# Patient Record
Sex: Female | Born: 1940
Health system: Southern US, Community
[De-identification: ages and names within clinical notes are randomized; demographics above are authoritative.]

## PROBLEM LIST (undated history)

## (undated) DIAGNOSIS — F419 Anxiety disorder, unspecified: Secondary | ICD-10-CM

## (undated) DIAGNOSIS — E119 Type 2 diabetes mellitus without complications: Secondary | ICD-10-CM

## (undated) DIAGNOSIS — M5136 Other intervertebral disc degeneration, lumbar region: Secondary | ICD-10-CM

## (undated) DIAGNOSIS — K219 Gastro-esophageal reflux disease without esophagitis: Secondary | ICD-10-CM

## (undated) DIAGNOSIS — G4733 Obstructive sleep apnea (adult) (pediatric): Secondary | ICD-10-CM

## (undated) DIAGNOSIS — G720 Drug-induced myopathy: Secondary | ICD-10-CM

## (undated) DIAGNOSIS — I2510 Atherosclerotic heart disease of native coronary artery without angina pectoris: Secondary | ICD-10-CM

## (undated) DIAGNOSIS — M51369 Other intervertebral disc degeneration, lumbar region without mention of lumbar back pain or lower extremity pain: Secondary | ICD-10-CM

## (undated) DIAGNOSIS — K760 Fatty (change of) liver, not elsewhere classified: Secondary | ICD-10-CM

## (undated) DIAGNOSIS — R030 Elevated blood-pressure reading, without diagnosis of hypertension: Secondary | ICD-10-CM

## (undated) DIAGNOSIS — R109 Unspecified abdominal pain: Secondary | ICD-10-CM

## (undated) DIAGNOSIS — R011 Cardiac murmur, unspecified: Secondary | ICD-10-CM

## (undated) DIAGNOSIS — E785 Hyperlipidemia, unspecified: Secondary | ICD-10-CM

## (undated) DIAGNOSIS — Z780 Asymptomatic menopausal state: Secondary | ICD-10-CM

## (undated) DIAGNOSIS — C4491 Basal cell carcinoma of skin, unspecified: Secondary | ICD-10-CM

## (undated) HISTORY — PX: BREAST EXCISIONAL BIOPSY: SUR124

## (undated) HISTORY — DX: Basal cell carcinoma of skin, unspecified: C44.91

## (undated) HISTORY — DX: Elevated blood-pressure reading, without diagnosis of hypertension: R03.0

## (undated) HISTORY — PX: FOOT SURGERY: SHX648

## (undated) HISTORY — PX: SHOULDER SURGERY: SHX246

## (undated) HISTORY — PX: CARPAL TUNNEL RELEASE: SHX101

## (undated) HISTORY — DX: Drug-induced myopathy: G72.0

## (undated) HISTORY — DX: Fatty (change of) liver, not elsewhere classified: K76.0

## (undated) HISTORY — DX: Anxiety disorder, unspecified: F41.9

## (undated) HISTORY — DX: Asymptomatic menopausal state: Z78.0

## (undated) HISTORY — PX: OTHER SURGICAL HISTORY: SHX169

## (undated) HISTORY — DX: Type 2 diabetes mellitus without complications: E11.9

## (undated) HISTORY — DX: Unspecified abdominal pain: R10.9

## (undated) HISTORY — DX: Hyperlipidemia, unspecified: E78.5

## (undated) HISTORY — PX: TUBAL LIGATION: SHX77

## (undated) HISTORY — PX: TOTAL HIP ARTHROPLASTY: SHX124

## (undated) HISTORY — DX: Gastro-esophageal reflux disease without esophagitis: K21.9

## (undated) HISTORY — DX: Obstructive sleep apnea (adult) (pediatric): G47.33

## (undated) HISTORY — DX: Other intervertebral disc degeneration, lumbar region: M51.36

## (undated) HISTORY — PX: NASAL SINUS SURGERY: SHX719

## (undated) HISTORY — DX: Other intervertebral disc degeneration, lumbar region without mention of lumbar back pain or lower extremity pain: M51.369

---

## 1998-08-23 ENCOUNTER — Other Ambulatory Visit: Admission: RE | Admit: 1998-08-23 | Discharge: 1998-08-23 | Payer: Self-pay | Admitting: Obstetrics and Gynecology

## 1999-09-11 ENCOUNTER — Other Ambulatory Visit: Admission: RE | Admit: 1999-09-11 | Discharge: 1999-09-11 | Payer: Self-pay | Admitting: Obstetrics and Gynecology

## 1999-10-02 ENCOUNTER — Ambulatory Visit (HOSPITAL_BASED_OUTPATIENT_CLINIC_OR_DEPARTMENT_OTHER): Admission: RE | Admit: 1999-10-02 | Discharge: 1999-10-02 | Payer: Self-pay | Admitting: General Surgery

## 2000-01-02 ENCOUNTER — Ambulatory Visit (HOSPITAL_COMMUNITY): Admission: RE | Admit: 2000-01-02 | Discharge: 2000-01-02 | Payer: Self-pay | Admitting: *Deleted

## 2000-01-02 ENCOUNTER — Encounter (INDEPENDENT_AMBULATORY_CARE_PROVIDER_SITE_OTHER): Payer: Self-pay | Admitting: Specialist

## 2000-01-03 ENCOUNTER — Encounter: Payer: Self-pay | Admitting: Obstetrics and Gynecology

## 2000-01-03 ENCOUNTER — Encounter: Admission: RE | Admit: 2000-01-03 | Discharge: 2000-01-03 | Payer: Self-pay | Admitting: Obstetrics and Gynecology

## 2000-05-08 ENCOUNTER — Encounter: Payer: Self-pay | Admitting: Endocrinology

## 2000-05-08 ENCOUNTER — Encounter: Admission: RE | Admit: 2000-05-08 | Discharge: 2000-05-08 | Payer: Self-pay | Admitting: Endocrinology

## 2000-06-24 ENCOUNTER — Encounter: Admission: RE | Admit: 2000-06-24 | Discharge: 2000-07-23 | Payer: Self-pay | Admitting: Neurological Surgery

## 2000-10-23 ENCOUNTER — Other Ambulatory Visit: Admission: RE | Admit: 2000-10-23 | Discharge: 2000-10-23 | Payer: Self-pay | Admitting: Obstetrics and Gynecology

## 2000-11-03 ENCOUNTER — Encounter: Payer: Self-pay | Admitting: Obstetrics and Gynecology

## 2000-11-03 ENCOUNTER — Encounter: Admission: RE | Admit: 2000-11-03 | Discharge: 2000-11-03 | Payer: Self-pay | Admitting: Obstetrics and Gynecology

## 2000-11-05 ENCOUNTER — Encounter: Admission: RE | Admit: 2000-11-05 | Discharge: 2000-11-05 | Payer: Self-pay | Admitting: Obstetrics and Gynecology

## 2000-11-05 ENCOUNTER — Encounter: Payer: Self-pay | Admitting: Obstetrics and Gynecology

## 2000-12-05 ENCOUNTER — Encounter (INDEPENDENT_AMBULATORY_CARE_PROVIDER_SITE_OTHER): Payer: Self-pay

## 2000-12-05 ENCOUNTER — Other Ambulatory Visit: Admission: RE | Admit: 2000-12-05 | Discharge: 2000-12-05 | Payer: Self-pay | Admitting: Obstetrics and Gynecology

## 2001-10-29 ENCOUNTER — Other Ambulatory Visit: Admission: RE | Admit: 2001-10-29 | Discharge: 2001-10-29 | Payer: Self-pay | Admitting: Obstetrics and Gynecology

## 2001-11-18 ENCOUNTER — Encounter: Payer: Self-pay | Admitting: Obstetrics and Gynecology

## 2001-11-18 ENCOUNTER — Encounter: Admission: RE | Admit: 2001-11-18 | Discharge: 2001-11-18 | Payer: Self-pay | Admitting: Obstetrics and Gynecology

## 2002-11-16 ENCOUNTER — Other Ambulatory Visit: Admission: RE | Admit: 2002-11-16 | Discharge: 2002-11-16 | Payer: Self-pay | Admitting: Obstetrics and Gynecology

## 2002-11-22 ENCOUNTER — Encounter: Admission: RE | Admit: 2002-11-22 | Discharge: 2002-11-22 | Payer: Self-pay | Admitting: Obstetrics and Gynecology

## 2002-11-22 ENCOUNTER — Encounter: Payer: Self-pay | Admitting: Obstetrics and Gynecology

## 2003-12-01 ENCOUNTER — Other Ambulatory Visit: Admission: RE | Admit: 2003-12-01 | Discharge: 2003-12-01 | Payer: Self-pay | Admitting: Obstetrics and Gynecology

## 2003-12-13 ENCOUNTER — Encounter: Admission: RE | Admit: 2003-12-13 | Discharge: 2003-12-13 | Payer: Self-pay | Admitting: Obstetrics and Gynecology

## 2004-03-21 ENCOUNTER — Encounter: Admission: RE | Admit: 2004-03-21 | Discharge: 2004-03-21 | Payer: Self-pay | Admitting: Otolaryngology

## 2004-03-28 ENCOUNTER — Ambulatory Visit (HOSPITAL_COMMUNITY): Admission: RE | Admit: 2004-03-28 | Discharge: 2004-03-28 | Payer: Self-pay | Admitting: Otolaryngology

## 2004-03-28 ENCOUNTER — Encounter (INDEPENDENT_AMBULATORY_CARE_PROVIDER_SITE_OTHER): Payer: Self-pay | Admitting: Specialist

## 2004-07-12 ENCOUNTER — Encounter (INDEPENDENT_AMBULATORY_CARE_PROVIDER_SITE_OTHER): Payer: Self-pay | Admitting: *Deleted

## 2004-07-12 ENCOUNTER — Inpatient Hospital Stay (HOSPITAL_COMMUNITY): Admission: RE | Admit: 2004-07-12 | Discharge: 2004-07-18 | Payer: Self-pay | Admitting: Neurological Surgery

## 2004-12-04 ENCOUNTER — Other Ambulatory Visit: Admission: RE | Admit: 2004-12-04 | Discharge: 2004-12-04 | Payer: Self-pay | Admitting: Obstetrics and Gynecology

## 2005-01-03 ENCOUNTER — Encounter: Admission: RE | Admit: 2005-01-03 | Discharge: 2005-01-03 | Payer: Self-pay | Admitting: Obstetrics and Gynecology

## 2006-01-02 ENCOUNTER — Other Ambulatory Visit: Admission: RE | Admit: 2006-01-02 | Discharge: 2006-01-02 | Payer: Self-pay | Admitting: Obstetrics and Gynecology

## 2006-01-14 ENCOUNTER — Encounter: Admission: RE | Admit: 2006-01-14 | Discharge: 2006-01-14 | Payer: Self-pay | Admitting: Obstetrics and Gynecology

## 2006-04-09 ENCOUNTER — Ambulatory Visit (HOSPITAL_COMMUNITY): Admission: RE | Admit: 2006-04-09 | Discharge: 2006-04-09 | Payer: Self-pay | Admitting: *Deleted

## 2006-04-09 ENCOUNTER — Encounter (INDEPENDENT_AMBULATORY_CARE_PROVIDER_SITE_OTHER): Payer: Self-pay | Admitting: *Deleted

## 2007-01-26 ENCOUNTER — Encounter: Admission: RE | Admit: 2007-01-26 | Discharge: 2007-01-26 | Payer: Self-pay | Admitting: Obstetrics and Gynecology

## 2007-02-10 ENCOUNTER — Other Ambulatory Visit: Admission: RE | Admit: 2007-02-10 | Discharge: 2007-02-10 | Payer: Self-pay | Admitting: Obstetrics and Gynecology

## 2008-01-28 ENCOUNTER — Encounter: Admission: RE | Admit: 2008-01-28 | Discharge: 2008-01-28 | Payer: Self-pay | Admitting: Obstetrics and Gynecology

## 2008-04-28 ENCOUNTER — Ambulatory Visit (HOSPITAL_COMMUNITY): Admission: RE | Admit: 2008-04-28 | Discharge: 2008-04-28 | Payer: Self-pay | Admitting: *Deleted

## 2008-05-05 ENCOUNTER — Encounter: Admission: RE | Admit: 2008-05-05 | Discharge: 2008-05-05 | Payer: Self-pay | Admitting: Orthopedic Surgery

## 2008-05-23 ENCOUNTER — Encounter: Admission: RE | Admit: 2008-05-23 | Discharge: 2008-05-23 | Payer: Self-pay | Admitting: Neurological Surgery

## 2009-02-02 ENCOUNTER — Encounter: Admission: RE | Admit: 2009-02-02 | Discharge: 2009-02-02 | Payer: Self-pay | Admitting: Obstetrics and Gynecology

## 2009-03-20 ENCOUNTER — Other Ambulatory Visit: Admission: RE | Admit: 2009-03-20 | Discharge: 2009-03-20 | Payer: Self-pay | Admitting: Obstetrics and Gynecology

## 2009-09-11 ENCOUNTER — Inpatient Hospital Stay (HOSPITAL_COMMUNITY): Admission: RE | Admit: 2009-09-11 | Discharge: 2009-09-14 | Payer: Self-pay | Admitting: Orthopedic Surgery

## 2010-02-09 ENCOUNTER — Encounter: Admission: RE | Admit: 2010-02-09 | Discharge: 2010-02-09 | Payer: Self-pay | Admitting: Endocrinology

## 2010-05-16 ENCOUNTER — Encounter: Admission: RE | Admit: 2010-05-16 | Discharge: 2010-05-16 | Payer: Self-pay | Admitting: Neurological Surgery

## 2010-09-30 LAB — BASIC METABOLIC PANEL
BUN: 7 mg/dL (ref 6–23)
BUN: 9 mg/dL (ref 6–23)
CO2: 28 mEq/L (ref 19–32)
Calcium: 8 mg/dL — ABNORMAL LOW (ref 8.4–10.5)
Calcium: 8.1 mg/dL — ABNORMAL LOW (ref 8.4–10.5)
Chloride: 102 mEq/L (ref 96–112)
Chloride: 104 mEq/L (ref 96–112)
Creatinine, Ser: 0.77 mg/dL (ref 0.4–1.2)
Creatinine, Ser: 0.88 mg/dL (ref 0.4–1.2)
GFR calc Af Amer: >60 mL/min (ref >60)
GFR calc Af Amer: >60 mL/min (ref >60)
GFR calc non Af Amer: >60 mL/min (ref >60)
GFR calc non Af Amer: >60 mL/min (ref >60)
GFR calc non Af Amer: >60 mL/min (ref >60)
Potassium: 4 mEq/L (ref 3.5–5.1)
Potassium: 4.1 mEq/L (ref 3.5–5.1)
Sodium: 136 mEq/L (ref 135–145)
Sodium: 139 mEq/L (ref 135–145)

## 2010-09-30 LAB — CROSSMATCH: Antibody Screen: NEGATIVE

## 2010-09-30 LAB — CBC
HCT: 24.5 % — ABNORMAL LOW (ref 36.0–46.0)
HCT: 31.2 % — ABNORMAL LOW (ref 36.0–46.0)
HCT: 39 % (ref 36.0–46.0)
Hemoglobin: 13.6 g/dL (ref 12.0–15.0)
MCV: 98.1 fL (ref 78.0–100.0)
Platelets: 159 10*3/uL (ref 150–400)
Platelets: 161 10*3/uL (ref 150–400)
Platelets: 189 10*3/uL (ref 150–400)
RBC: 2.89 MIL/uL — ABNORMAL LOW (ref 3.87–5.11)
RBC: 4.03 MIL/uL (ref 3.87–5.11)
RDW: 12.6 % (ref 11.5–15.5)
WBC: 7.5 10*3/uL (ref 4.0–10.5)
WBC: 7.6 10*3/uL (ref 4.0–10.5)
WBC: 7.7 10*3/uL (ref 4.0–10.5)

## 2010-09-30 LAB — DIFFERENTIAL
Basophils Absolute: 0 10*3/uL (ref 0.0–0.1)
Basophils Relative: 1 % (ref 0–1)
Eosinophils Absolute: 0.1 10*3/uL (ref 0.0–0.7)
Monocytes Relative: 8 % (ref 3–12)
Neutro Abs: 4.9 10*3/uL (ref 1.7–7.7)
Neutrophils Relative %: 65 % (ref 43–77)

## 2010-09-30 LAB — COMPREHENSIVE METABOLIC PANEL
ALT: 25 U/L (ref 0–35)
Alkaline Phosphatase: 67 U/L (ref 39–117)
BUN: 11 mg/dL (ref 6–23)
CO2: 26 mEq/L (ref 19–32)
Chloride: 101 mEq/L (ref 96–112)
GFR calc non Af Amer: >60 mL/min (ref >60)
Glucose, Bld: 108 mg/dL — ABNORMAL HIGH (ref 70–99)
Potassium: 4.9 mEq/L (ref 3.5–5.1)
Sodium: 136 mEq/L (ref 135–145)
Total Bilirubin: 0.5 mg/dL (ref 0.3–1.2)
Total Protein: 6.9 g/dL (ref 6.0–8.3)

## 2010-09-30 LAB — URINALYSIS, ROUTINE W REFLEX MICROSCOPIC
Bilirubin Urine: NEGATIVE
Hgb urine dipstick: NEGATIVE
Ketones, ur: NEGATIVE mg/dL
Specific Gravity, Urine: 1.006 (ref 1.005–1.030)
Urobilinogen, UA: 0.2 mg/dL (ref 0.0–1.0)

## 2010-09-30 LAB — URINE MICROSCOPIC-ADD ON

## 2010-09-30 LAB — URINE CULTURE
Colony Count: NO GROWTH
Culture: NO GROWTH

## 2010-09-30 LAB — APTT: aPTT: 32 seconds (ref 24–37)

## 2010-09-30 LAB — PROTIME-INR: INR: 0.99 (ref 0.00–1.49)

## 2010-09-30 LAB — ABO/RH: ABO/RH(D): A POS

## 2010-11-20 NOTE — Op Note (Signed)
Teresa Marquez, DELCONTE          ACCOUNT NO.:  [id number]   MEDICAL RECORD NO.:  [id number]          PATIENT TYPE:  AMB   LOCATION:  ENDO                         FACILITY:  [facility]   PHYSICIAN:  Georgiana Spinner, M.D.    DATE OF BIRTH:  Nov 24, 1940   DATE OF PROCEDURE:  04/28/2008  DATE OF DISCHARGE:                               OPERATIVE REPORT   PROCEDURE:  Colonoscopy.   INDICATIONS:  Colon polyp follow-up.   ANESTHESIA:  Fentanyl 75 mcg, Versed 7.5 mg.   PROCEDURE:  With the patient mildly sedated in the left lateral  decubitus position the Pentax videoscopic colonoscope was inserted in  the rectum and passed under direct vision to the cecum identified by  ileocecal valve and appendiceal orifice both of which were photographed.  From this point the colonoscope was slowly withdrawn taking  circumferential views of colonic mucosa stopping only in the rectum  which appeared normal on direct and showed hemorrhoids on retroflexed  view. The endoscope was straightened and withdrawn.  The patient's vital  signs, pulse oximeter remained stable.  The patient tolerated procedure  well without apparent complications.   FINDINGS:  Internal hemorrhoids, otherwise negative colonoscopic  examination to the cecum.   PLAN:  Repeat examination in 5 years.           ______________________________  Georgiana Spinner, M.D.     GMO/MEDQ  D:  04/28/2008  T:  04/28/2008  Job:  [id number]

## 2010-11-23 NOTE — H&P (Signed)
Teresa Marquez, Teresa Marquez          ACCOUNT NO.:  [id number]   MEDICAL RECORD NO.:  [id number]          PATIENT TYPE:  INP   LOCATION:  2899                         FACILITY:  [facility]   PHYSICIAN:  Stefani Dama, M.D.  DATE OF BIRTH:  1940/09/26   DATE OF ADMISSION:  07/12/2004  DATE OF DISCHARGE:                                HISTORY & PHYSICAL   ADMISSION DIAGNOSIS:  Pituitary adenoma.   HISTORY OF PRESENT ILLNESS:  Teresa Marquez is a 70 year old right-  handed individual who has been seen in the past for back problems.  She has  had difficulty with severe snoring for a long period of time, and she was  recently seen by Dr. Osborn Coho for a workup, including a CT scan that  demonstrated a mass in the region of the sphenoid sinus.  An MRI  subsequently demonstrated evidence of intracranial extension.  Except for  the fact that the lesion was largely growing laterally to encase the carotid  arteries and expanded the sphenoid sinus substantially, there was not a  significant extension that would be compressing the optic apparatus.  A  biopsy of this lesion was performed, and it was felt to be consistent with a  pituitary adenoma by Dr. Amparo Bristol at [facility].  The  patient is now being admitted to the hospital to undergo transsphenoidal  resection of the bulk of this lesion.  She notes that she has a consistent  feeling of headache, fullness in the nose, and breathing has been quite  difficulty, particularly at night with loud, sonorous snoring.  Because of  this mass causing the somatic symptoms she is experiencing, she has been  advised regarding surgical decompression.  A preoperative endocrinologic  workup shows that her endocrine function is within the limits of normal and  no visual interference is noted on the basis of this tumor.   PAST MEDICAL HISTORY:  Her general health has been fair.  She does have some  mild hypertension.  She has a  significant history of reflux, managed by  Protonix.   She notes an allergy to PENICILLIN.   The morbid history of significant sleep apnea has been noted.   SOCIAL HISTORY:  She is married.  She does not smoke.  She does not drink  alcohol.  Her height and weight have been reasonably stable.   PHYSICAL EXAMINATION:  GENERAL:  Her blood pressure is 150/84, heart rate is  92 and regular, respirations are 16 and unlabored.  NEUROLOGIC:  Pupils are 4 mm and briskly reactive to light and  accommodation.  Extraocular movements are full, and the face is symmetric to  grimace.  Tongue and uvula are in the midline.  Funduscopic examination  reveals that discs are flat.  There is some pallor in the optic disc on the  right side.  Her visual field examination to coarse confrontation is normal.  Motor strength appears symmetric and without any evidence of a drift.  Station and gait are normal.  Deep tendon reflexes are 2+ in the biceps and  triceps, 2+ in the brachioradialis, 1+ in the patellae,  and the Babinski  reflexes are downgoing.  CHEST:  General physical exam reveals the lungs are clear to auscultation.  CARDIAC:  The heart has a regular rate and rhythm, no murmurs is heard.  ABDOMEN:  Soft, protuberant, bowel sounds are positive, no masses are  palpable.  EXTREMITIES:  No cyanosis, clubbing, or edema.   IMPRESSION:  The patient has evidence of significant pituitary adenoma.  She  has been advised regarding the risks and benefits of a surgical resection of  this lesion.  She does feel that there has been increasing pain related to  this lesion, plus that it seems to be obstructing her ability to breathe,  particularly at night, and she is now being admitted to undergo elective  resection of this mass.      Henr   HJE/MEDQ  D:  07/12/2004  T:  07/12/2004  Job:  [id number]

## 2010-11-23 NOTE — Procedures (Signed)
[facility]  Patient:    Teresa Marquez, Teresa Marquez                 MRN: [id number] Adm. Date:  [id number] Attending:  Sabino Gasser                           Procedure Report  PROCEDURE:  Colonoscopy.  INDICATIONS:  Hemoccult positivity, family history of colon cancer.  ANESTHESIA:  Demerol 20 mg, Versed 2 mg, was given intravenously in divided dose.  DESCRIPTION OF PROCEDURE:  With the patient mildly sedated in the left lateral decubitus position, the rectal exam was performed, which showed hemorrhoids. Subsequently, the Olympus videoscopic colonoscope was inserted in the rectum and passed under direct vision to the cecum.  Cecum identified by ileocecal valve and appendiceal orifice, both of which were photographed.  From this point, the colonoscope was slowly withdrawn, taking circumferential views of the entire colonic mucosa, stopping only in the rectum, which appeared normal on direct view and showed mild internal hemorrhoids on retroflexed view.  The endoscope was straightened and withdrawn.  The patients vital signs and pulse oximetry remained stable.  The patient tolerated the procedure well without apparent complications.  FINDINGS:  Hemorrhoids, otherwise unremarkable examination.  PLAN:  Repeat exam in five years.DD:  01/02/00 TD:  01/03/00 Job: [id number]

## 2010-11-23 NOTE — Op Note (Signed)
NAMEKYMBERLI, WIEGAND          ACCOUNT NO.:  [id number]   MEDICAL RECORD NO.:  [id number]          PATIENT TYPE:  INP   LOCATION:  2899                         FACILITY:  [facility]   PHYSICIAN:  Stefani Dama, M.D.  DATE OF BIRTH:  [birth year]   DATE OF PROCEDURE:  07/12/2004  DATE OF DISCHARGE:                                 OPERATIVE REPORT   PREOPERATIVE DIAGNOSIS:  Pituitary adenoma.   POSTOPERATIVE DIAGNOSIS:  Pituitary adenoma.   PROCEDURE:  Insertion of lumbar spinal fluid drainage catheter.   SURGEON:  Stefani Dama, M.D.   ANESTHESIA:  General endotracheal.   INDICATIONS:  Ms. Teresa Marquez is a 70 year old individual who is  undergoing transsphenoidal resection of a pituitary adenoma.  Prior to the  start of surgery, it was advised that she have a lumbar drainage catheter  placed for CSF diversion for management of potential CSF leaks during the  postoperative phase; this is now being performed.   PROCEDURE:  The patient was placed under general endotracheal anesthesia and  after central venous monitoring catheters and a Foley catheter was placed,  she was turned into the left lateral decubitus position and the back was  prepped with alcohol, then Betadine solution.  Once dried, a 17-gauge Tuohy  needle was inserted into the interlaminar space believed to be at L3-L4 by  palpation; spinal fluid was obtained.  A lumbar drainage catheter was then  inserted into the Tuohy needle and treated approximately 15 cm into the  epidural space.  Carefully then, the needle was withdrawn; catheter removed  in place.  The system was then connected to a drainage system and placed in  the off position.  A dry sterile dressing was applied over the insertion  site and the catheter itself was secured to the right lateral aspect of the  patient's abdomen.  She was then turned back into the supine position and  the procedure was the commenced.      Henr   HJE/MEDQ  D:   07/12/2004  T:  07/12/2004  Job:  [id number]

## 2010-11-23 NOTE — Op Note (Signed)
NAMEANNIKAH, LOVINS          ACCOUNT NO.:  [id number]   MEDICAL RECORD NO.:  [id number]          PATIENT TYPE:  INP   LOCATION:  2899                         FACILITY:  [facility]   PHYSICIAN:  Kinnie Scales. Shoemaker, M.D.DATE OF BIRTH:  [birth year]   DATE OF PROCEDURE:  07/12/2004  DATE OF DISCHARGE:                                 OPERATIVE REPORT   PREOPERATIVE DIAGNOSIS:  Massive pituitary tumor with extension into the  sphenoid sinus and nasal cavity.   POSTOPERATIVE DIAGNOSIS:  Massive pituitary tumor with extension into the  sphenoid sinus and nasal cavity.   PROCEDURE:  Transseptal transsphenoidal resection of pituitary tumor.  This  is ENT dictation for approach and closure for pituitary hypophysectomy.   ANESTHESIA:  General endotracheal.   SURGEON:  Neurosurgeon:  Stefani Dama, M.D.  ENT surgeon:  Kinnie Scales. Annalee Genta, M.D.   ESTIMATED BLOOD LOSS:  Approximately 100 mL.   COMPLICATIONS:  None.  The patient was transferred from the operating room  to the recovery room in stable condition.   INDICATIONS FOR PROCEDURE:  The patient is a 70 year old white female who  was evaluated with a history of nasal airway obstruction and snoring without  significant obstructive sleep apnea.  In evaluating the patient she was  found to have a soft tissue mass filling the posterior aspect of the nasal  cavity contributing to nasal airway congestion and obstruction.  Evaluation  of this mass including CT scan and MRI showed a very large soft tissue mass  consistent with a possible pituitary adenoma extending from the middle  cranial fossa into the sphenoid sinus and extending into the posterior nasal  cavity.  Biopsy was undertaken for tissue diagnosis and this was performed  under general anesthesia on March 28, 2004.  Pathology from the  endoscopic biopsy was consistent with benign pituitary adenoma and despite  the extensive nature of the patient's tumor, it was thought that  this  represented benign process.  Neurosurgical consultation with Dr. Danielle Dess was  then undertaken and given the extensive nature of the mass and lack of  physical findings, recommendations were discussed regarding various  treatment options.  The risks, benefits, and possible complications of  surgical intervention were discussed in detail with the patient and her  family and they opted for surgical excision of the mass under general  anesthesia.   DESCRIPTION OF PROCEDURE:  The patient was brought to the operating room on  July 12, 2004, and placed in the supine position on the operating table.  General endotracheal anesthesia was then established without difficulty.  A  lumbar drain was placed by Dr. Danielle Dess and this is dictated as a separate  operative procedure.  The patient's abdomen and nasal cavity were then  prepped and draped.  The patient's nose was injected with a total of 15 mL  of 1% lidocaine with 1:100,000 dilution of epinephrine which was injected in  a submucosal fashion along the nasal septum and nasal floor bilaterally.  The patient's nose was then packed with Afrin-soaked cottonoid pledgets  which were left in place for approximately 10 minutes to allow for  vasoconstriction  and hemostasis.  The patient had undergone prior nasal  septoplasty.  Given this fact, a transseptal approach was recommended.  A  right hemitransfixion incision was created in the anterior aspect of the  nasal cavity and this was carried across the floor of the right nasal  cavity.  A mucoperichondrial flap was elevated from anterior to posterior  along the patient's right-hand side, previous surgery had resulted in  resection of mid and posterior septal cartilage.  The mucosal flaps were  then elevated bilaterally and dissection was carried out from anterior to  posterior to the level of the anterior face of the sphenoid sinus.  At this  point, tumor was encountered within the sphenoid sinus  and a biopsy was  taken for frozen section to confirm diagnosis.  This came back as findings  consistent with benign pituitary adenoma.  An anterior transcolumellar  incision was then created along the anterior aspect of the external nasal  root using a #15 scalpel, the incision was created in the skin and  underlying subcutaneous tissue.  The anterior aspect of the nasal septal  cartilage and lower lateral cartilage were identified.  These were divided  at their attachments inferiorly and dissection was carried out along the  maxillary crest elevating the left septal mucosa laterally and creating  direct access for surgical intervention within the sphenoid and pituitary  regions.  A pituitary retractor was then inserted full length and C-arm  confirmation of anatomic location and surgical approach were confirmed.   Resection of the pituitary tumor was then undertaken by Dr. Danielle Dess and this  is dictated as a separate operative report.   Once the pituitary tumor had been adequately resected, the sphenoid cavity  was then packed with abdominal fat which was harvested through a  transabdominal incision.  The transseptal flaps were then reapproximated  with a 4-0 gut suture on a Keith needle in a horizontal mattress fashion  beginning posteriorly and extending anteriorly to reapproximate the entire  mucosal flaps.  Inspection of the nasal cavity was then undertaken and there  was no evidence of mucosal tear, bleeding, or CSF leak.  No evidence of fat  within the nasal cavity or at the level of the sphenoid ostia.  The anterior  columellar incision was closed in multiple layers with reapproximation of  the septal cartilage to the maxillary crest with a 4-0 Vicryl suture.  Deep  subcutaneous sutures were then used to close the incision and a final skin  closure was achieved with a 6-0 Ethilon suture.  Anterior hemitransfixion incision and mucosal flaps were again approximately with 4-0 gut  suture on a  Keith needle and this was done in a horizontal mattressing fashion.  Bilateral nasal septal splints were then placed after the application of  Bactroban ointment and these were sutured in position with a 3-0 Ethilon  suture.  The patient's nasal cavity was then packed bilaterally with an 8 cm  Mauriceau sponge in each nasal cavity.  This was hydrated with saline  solution.  The patient's oral cavity and oropharynx were then irrigated and  suctioned and oral gastric tube was passed and the stomach contents were  aspirated.  The patient was awakened from her anesthetic, she was extubated,  and was then transferred from the operating room to the recovery room in  stable condition.       DLS/MEDQ  D:  04/54/0981  T:  07/12/2004  Job:  [id number]

## 2010-11-23 NOTE — Discharge Summary (Signed)
[name], [name]          ACCOUNT NO.:  [id number]   MEDICAL RECORD NO.:  [id number]          PATIENT TYPE:  INP   LOCATION:  3102                         FACILITY:  [facility]   PHYSICIAN:  Stefani Dama, M.D.  DATE OF BIRTH:  08-06-1940   DATE OF ADMISSION:  07/12/2004  DATE OF DISCHARGE:  07/18/2004                                 DISCHARGE SUMMARY   ADMITTING DIAGNOSIS:  Pituitary macroadenoma.   MAJOR OPERATION:  Transsphenoidal resection of pituitary macroadenoma,  placement of lumbar drain.   CONDITION ON DISCHARGE:  Improving.   HOSPITAL COURSE:  Ms. Teresa Marquez is a 70 year old individual who was  noted to have difficulty with breathing and sonorous snoring.  She was  evaluated with a CT scan and an MRI that demonstrated the presence of a  large mass in the nasal sinus extending through the region of the sphenoid.  Biopsy of the nasal mass revealed this to be a pituitary adenoma.  After  preoperative evaluation, it was noted that she was endocrinologically intact  and the patient had normal ophthalmologic function.  She was advised  regarding surgical resection of this via a transsphenoidal route.  She was  taken to the operating room on July 12, 2004, where a combined procedure  for transsphenoidal resection of the tumor was performed with Dr. Onalee Hua L.  Shoemaker.  Postoperatively, the patient had a lumbar drain placed to  decrease intracranial pressure and drain spinal fluid so that no leakage  would ensue and this worked successfully.  Drain was removed on the 9th and  the patient had nasal packings also removed at that time.  She is breathing  through her nose and at the current time, she is not experiencing any  evidence of spinal fluid leakage.  She has been placed on some hormonal  replacement therapy in the form of hydrocortisone and will be sent home on a  decreasing Dosepak of Medrol.  She will be seen in about 2-3 weeks' time for  further  followup.  Pain has been minimal and well-controlled with mild  analgesics and she is given a prescription for Darvocet-N 100, #40.  Electrolytes and blood counts are within the limits of normal at the time of  discharge.   CONDITION ON DISCHARGE:  Improved.      Henr   HJE/MEDQ  D:  07/18/2004  T:  07/18/2004  Job:  [id number]

## 2010-11-23 NOTE — Procedures (Signed)
[facility]  Patient:    Teresa Marquez, Teresa Marquez                 MRN: [id number] Adm. Date:  [id number] Attending:  Sabino Gasser                           Procedure Report  PROCEDURE:  Upper endoscopy.  ENDOSCOPIST:  Sabino Gasser, M.D.  INDICATIONS:  Reflux esophagitis symptoms, fairly significant.  ANESTHESIA:  Demerol 60 mg and Versed 7 mg were given intravenously in divided dose.  DESCRIPTION OF PROCEDURE:  With patient mildly sedated in the left lateral decubitus position, the Olympus videoscopic endoscope was inserted in the mouth and passed under direct vision through the esophagus, which appeared normal except for the distal-most esophagus where there were changes of esophagitis which were photographed and biopsied.  We entered into the stomach.  The fundus, body and antrum were all well-visualized and appeared normal, as did duodenal bulb and second portion of duodenum.  Photographs were taken.  From this point, the scope was slowly withdrawn, taking circumferential views of the entire duodenal mucosa, until the endoscope had been pulled back into the stomach and placed in retroflexion to view the stomach from below and this too appeared normal and was photographed.  The endoscope was then straightened, pulled back from distal to proximal stomach, taking circumferential views of the entire gastric and subsequently esophageal mucosa which otherwise appeared normal.  Patients vital signs and pulse oximetry remained stable.  Patient tolerated the procedure well without apparent complications.  FINDINGS:  Changes of esophagitis, fairly mild actually, of the distal-most esophagus and proximal stomach area.  PLAN:  Await biopsy report.  Patient will call me for the results and follow up with me as an outpatient.  Proceed to colonoscopy. DD:  01/02/00 TD:  01/03/00 Job: [id number]

## 2010-11-23 NOTE — Op Note (Signed)
Teresa Marquez, Teresa Marquez          ACCOUNT NO.:  [id number]   MEDICAL RECORD NO.:  [id number]          PATIENT TYPE:  OIB   LOCATION:  NA                           FACILITY:  [facility]   PHYSICIAN:  Kinnie Scales. Annalee Genta, M.D.DATE OF BIRTH:  1940-07-21   DATE OF PROCEDURE:  03/28/2004  DATE OF DISCHARGE:                                 OPERATIVE REPORT   PREOPERATIVE DIAGNOSIS:  Expansile sphenoid mass.   POSTOPERATIVE DIAGNOSIS:  Expansile sphenoid mass.   OPERATION PERFORMED:  Endoscopic examination and biopsy of sphenoid mass  under general anesthesia.   SURGEON:  Kinnie Scales. Annalee Genta, M.D.   ANESTHESIA:  General.   COMPLICATIONS:  None.   ESTIMATED BLOOD LOSS:  Less than 50 mL.   The patient was transferred from the operating room to the recovery room in  stable condition.   INDICATIONS FOR PROCEDURE:  Teresa Marquez is a 70 year old white female who  has been followed with a history of nasal congestion, sinusitis and  obstructive sleep apnea.  The patient underwent nasal septoplasty and  anterior sinus surgery by Dr. Lazarus Salines in the early 1990s.  She had a  significant improvement in breathing and congestion.  She was seen in 1999  for evaluation of snoring and sleep apnea and underwent somnoplasty  procedure.  She returned several months ago with complaints of nasal  congestion and sinusitis.  The patient was evaluated in the office and  endoscopic examination revealed a polypoid mass in the right posterior nasal  cavity.  CT scanning was performed in the office which showed a large mass  filling the sphenoid sinus with bony erosion.  Work-up including CT and MRI  was performed.  This showed a large expansile mass involving the sphenoid  sinus and extending intracranially involving bilateral carotid arteries and  bilateral optic nerves.  Given the patient's history and examination, I  recommended that we undertake endoscopic biopsy in the operating room under  general anesthesia  for pathologic tissue diagnosis.  The risks, benefits and  possible complications were discussed.  The patient and her husband  understood and concurred with our plan for surgery which was scheduled as  above.   DESCRIPTION OF PROCEDURE:  The patient was brought to the operating room on  March 28, 2004 and placed in supine position on the operating room.  General endotracheal was established without difficulty.  When the patient  was adequately anesthetized, her nose was injected with 3 mL of 1% lidocaine  1:100,000 dilution epinephrine injected under direct visualization with a 0  degree endoscope into the anterior aspect of the sphenoid mass.  Cotton  pledgets were then placed for vasoconstriction.  After allowing adequate  time for vasoconstriction and hemostasis, the surgical procedure was begun  with bilateral examination of the nasal cavity.  The patient had a large  vascular appearing polypoid mass emanating from the anterior aspect of the  sphenoid sinus and filling the posterior superior aspect of the nasal  cavity.  No other intranasal abnormalities were noted.  There was no  evidence of purulent discharge, mass, polyp or other abnormality.  With the  mass identified,  biopsies were taken with through cutting forceps and sent  to pathology for frozen section analysis.  Initial diagnosis showed invasive  malignancy, specific tissue type unclear.  Additional biopsies were then  taken to allow adequate tissue for pathologic diagnosis.  The patient's  biopsy site was then cauterized with suction cautery and Surgicel was  layered over the area of biopsy for hemostasis.  The patient's nasal cavity  and nasopharynx were then irrigated and suctioned.  The oral cavity and  oropharynx suctioned, orogastric tube was passed.  the patient was awakened  from her anesthetic, extubated and transferred from the operating room to  the recovery room in stable condition.  There were no  complications.  Blood  loss was less than 50 mL.       DLS/MEDQ  D:  56/21/3086  T:  03/29/2004  Job:  [id number]   cc:   Brooke Bonito, M.D.  [address]  [place]  [place] [address]  Fax: [phone]

## 2010-11-23 NOTE — Op Note (Signed)
Teresa Marquez, ACHORN          ACCOUNT NO.:  [id number]   MEDICAL RECORD NO.:  [id number]          PATIENT TYPE:  INP   LOCATION:  2899                         FACILITY:  [facility]   PHYSICIAN:  Stefani Dama, M.D.  DATE OF BIRTH:  03-14-1941   DATE OF PROCEDURE:  07/12/2004  DATE OF DISCHARGE:                                 OPERATIVE REPORT   PREOPERATIVE DIAGNOSIS:  Pituitary adenoma.   POSTOPERATIVE DIAGNOSIS:  Pituitary adenoma.   PROCEDURE:  Transsphenoidal resection of pituitary adenoma.   SURGEON:  Stefani Dama, M.D.   ASSISTANT:  None.   APPROACH AND CLOSURE:  Onalee Hua L. Annalee Genta, M.D.   INDICATIONS FOR PROCEDURE:  The patient is a 70 year old individual who has  had significant problems with frontal headaches.  She was seen by Dr.  Annalee Genta and an MRI scan and a CAT scan initially demonstrated the presence  of tumor in the sphenoid region eroding down into the nasal passages.  The  mass was large and Dr. Annalee Genta performed a local biopsy which demonstrated  pituitary adenoma.  I had seen the patient a few months ago and had advised  that ultimately she would require surgical decompression and extirpation of  this tumor.  Her workup included visual field studies and ocular examination  which was completely within normal limits, hormonal studies which also were  within normal limits.  She is now being admitted and taken to the operating  room to undergo elective resection of her pituitary adenoma.   DESCRIPTION OF PROCEDURE:  The patient was brought to the operating room  supine on the stretcher.  After the smooth induction of general endotracheal  anesthesia, central venous monitoring catheter was placed along with Foley  catheter and also a lumbar spinal drain was placed by myself.  The patient  was then placed in three-pin head rest and with her being placed in a  lounging position, the nasal passages were prepped by Dr. Annalee Genta.  The  approach was then  performed via a translabial approach and the anterior  portion of the sphenoid sinus was encountered where tumor was grossly  visible.  At this point, the microscope was draped and brought into the  field and I proceeded to resect the tumor from the pituitary cavity  posteriorly and superiorly toward the pituitary fossa.  Some loose fragments  of bone were encountered and these were resected.  They were felt to be the  anterior portion of the sella turcica.  The tumor around this area was  resected carefully.  One opening into the subdural space was performed where  spinal fluid did trickle itself out through this small perforation.  This  area was packed off and further resection of the tumor yielded tumor at both  lateral passages in the regions of the sphenoid and posteriorly back toward  the region of the clivus.  This was resected gingerly and no dural  incursions had occurred in this region.  The tumor was very gelatinous and  each area of exploration was accompanied by some small amount of hemorrhage  which was easily packed off and then irrigated away  with blood clot being  removed and subsequent hemorrhage being minimal.  The anterior portions were  similarly resected.  After passing a number of curets and rongeurs into the  lateral aspects of the cavity, no further tumor was being recovered.  At  this point, further exploration yielded no further tumor and with hemostasis  generally being achieved in most of the areas of the dural folds, the area  of the spinal fluid leak was explored.  It was felt that this area could be  closed with a stitch and using a Caster needle holder, a 6-0 Prolene was  passed across the opening in the dura and closed with a single simple  suture.  The lateral aspect of the dural opening was still patent and this  area was infused with a 0.25 mL of Tisseel which had been heated and  prepared.  The surface over the dural leak was also closed with Tisseel.   Fat graft was then harvested from the right upper quadrant and this wound  itself was closed with 3-0 Vicryl interrupted fashion.  The fat itself was  then cut into portions and sized to place several pieces of fat posteriorly  directly behind the area of the major resection, and then laterally into the  sphenoid sinus toward the left and toward the right.  Once the fat grafts  were placed, the procedure was turned back over to Dr. Annalee Genta for final  closure and obliteration of the nasal passages.      Henr   HJE/MEDQ  D:  07/12/2004  T:  07/12/2004  Job:  [id number]

## 2010-11-23 NOTE — Op Note (Signed)
NAMETARESSA, RAUH          ACCOUNT NO.:  [id number]   MEDICAL RECORD NO.:  [id number]          PATIENT TYPE:  AMB   LOCATION:  ENDO                         FACILITY:  [facility]   PHYSICIAN:  Georgiana Spinner, M.D.    DATE OF BIRTH:  10-Aug-1940   DATE OF PROCEDURE:  DATE OF DISCHARGE:                                 OPERATIVE REPORT   PROCEDURE:  Colonoscopy.   INDICATIONS:  Colon polyp and rectal bleeding.   ANESTHESIA:  Fentanyl 75 mcg, Versed 6 mg.   PROCEDURE:  With patient mildly sedated in the left lateral decubitus  position, the Olympus videoscopic colonoscope was inserted into the rectum  and passed under direct vision to the cecum identified by the ileocecal  valve and appendiceal orifice, both of which were photographed.  From this  point the colonoscope was slowly withdrawn, taking circumferential views of  colonic mucosa, stopping in the ascending colon where a small polyp was  seen, photographed and removed using hot biopsy forceps technique, setting  20/200 blended current.  We next stopped in the rectum which appeared normal  in direct, showed hemorrhoids on retroflex view.  The endoscope was  straightened and withdrawn.  Patient's vital signs, pulse oximeter, remained  stable.  Patient tolerated the procedure well without apparent  complications.   FINDINGS:  Internal hemorrhoids, polyp of ascending colon.   PLAN:  Await biopsy report.  Patient will call me for results and follow up  with me as an outpatient.           ______________________________  Georgiana Spinner, M.D.     GMO/MEDQ  D:  04/09/2006  T:  04/09/2006  Job:  [id number]

## 2011-01-15 ENCOUNTER — Other Ambulatory Visit: Payer: Self-pay | Admitting: Endocrinology

## 2011-01-15 DIAGNOSIS — Z1231 Encounter for screening mammogram for malignant neoplasm of breast: Secondary | ICD-10-CM

## 2011-02-11 ENCOUNTER — Ambulatory Visit
Admission: RE | Admit: 2011-02-11 | Discharge: 2011-02-11 | Disposition: A | Discharge status: Home / Self Care | Payer: Medicare Other | Source: Ambulatory Visit | Attending: Endocrinology | Admitting: Endocrinology

## 2011-02-11 DIAGNOSIS — Z1231 Encounter for screening mammogram for malignant neoplasm of breast: Secondary | ICD-10-CM

## 2011-02-12 ENCOUNTER — Other Ambulatory Visit: Payer: Self-pay | Admitting: Endocrinology

## 2011-02-12 DIAGNOSIS — R928 Other abnormal and inconclusive findings on diagnostic imaging of breast: Secondary | ICD-10-CM

## 2011-02-20 ENCOUNTER — Ambulatory Visit
Admission: RE | Admit: 2011-02-20 | Discharge: 2011-02-20 | Disposition: A | Discharge status: Home / Self Care | Payer: Medicare Other | Source: Ambulatory Visit | Attending: Endocrinology | Admitting: Endocrinology

## 2011-02-20 DIAGNOSIS — R928 Other abnormal and inconclusive findings on diagnostic imaging of breast: Secondary | ICD-10-CM

## 2011-04-01 ENCOUNTER — Other Ambulatory Visit (HOSPITAL_COMMUNITY)
Admission: RE | Admit: 2011-04-01 | Discharge: 2011-04-01 | Disposition: A | Discharge status: Home / Self Care | Payer: Medicare Other | Source: Ambulatory Visit | Attending: Obstetrics and Gynecology | Admitting: Obstetrics and Gynecology

## 2011-04-01 ENCOUNTER — Other Ambulatory Visit: Payer: Self-pay | Admitting: Obstetrics and Gynecology

## 2011-04-01 DIAGNOSIS — Z124 Encounter for screening for malignant neoplasm of cervix: Secondary | ICD-10-CM | POA: Insufficient documentation

## 2011-04-16 ENCOUNTER — Other Ambulatory Visit: Payer: Self-pay | Admitting: Endocrinology

## 2011-04-18 ENCOUNTER — Ambulatory Visit
Admission: RE | Admit: 2011-04-18 | Discharge: 2011-04-18 | Disposition: A | Discharge status: Home / Self Care | Payer: Medicare Other | Source: Ambulatory Visit | Attending: Endocrinology | Admitting: Endocrinology

## 2011-07-18 ENCOUNTER — Other Ambulatory Visit: Payer: Self-pay | Admitting: Podiatry

## 2011-07-18 DIAGNOSIS — G5760 Lesion of plantar nerve, unspecified lower limb: Secondary | ICD-10-CM

## 2011-07-18 DIAGNOSIS — M779 Enthesopathy, unspecified: Secondary | ICD-10-CM

## 2011-07-22 ENCOUNTER — Ambulatory Visit
Admission: RE | Admit: 2011-07-22 | Discharge: 2011-07-22 | Disposition: A | Discharge status: Home / Self Care | Payer: Medicare Other | Source: Ambulatory Visit | Attending: Podiatry | Admitting: Podiatry

## 2011-07-22 DIAGNOSIS — M779 Enthesopathy, unspecified: Secondary | ICD-10-CM

## 2011-07-22 DIAGNOSIS — G5760 Lesion of plantar nerve, unspecified lower limb: Secondary | ICD-10-CM

## 2011-07-22 MED ORDER — GADOBENATE DIMEGLUMINE 529 MG/ML IV SOLN
9.0000 mL | Freq: Once | INTRAVENOUS | Status: AC | PRN
  Administered 2011-07-22: 9 mL via INTRAVENOUS

## 2012-01-20 ENCOUNTER — Other Ambulatory Visit: Payer: Self-pay | Admitting: Obstetrics and Gynecology

## 2012-01-20 DIAGNOSIS — Z1231 Encounter for screening mammogram for malignant neoplasm of breast: Secondary | ICD-10-CM

## 2012-02-21 ENCOUNTER — Ambulatory Visit
Admission: RE | Admit: 2012-02-21 | Discharge: 2012-02-21 | Disposition: A | Discharge status: Home / Self Care | Payer: Medicare Other | Source: Ambulatory Visit | Attending: Obstetrics and Gynecology | Admitting: Obstetrics and Gynecology

## 2012-02-21 DIAGNOSIS — Z1231 Encounter for screening mammogram for malignant neoplasm of breast: Secondary | ICD-10-CM

## 2012-02-25 ENCOUNTER — Ambulatory Visit: Payer: Medicare Other

## 2013-02-02 ENCOUNTER — Other Ambulatory Visit: Payer: Self-pay

## 2013-02-02 DIAGNOSIS — Z1231 Encounter for screening mammogram for malignant neoplasm of breast: Secondary | ICD-10-CM

## 2013-02-23 ENCOUNTER — Ambulatory Visit
Admission: RE | Admit: 2013-02-23 | Discharge: 2013-02-23 | Disposition: A | Discharge status: Home / Self Care | Payer: Medicare Other | Source: Ambulatory Visit

## 2013-02-23 DIAGNOSIS — Z1231 Encounter for screening mammogram for malignant neoplasm of breast: Secondary | ICD-10-CM

## 2013-03-05 DIAGNOSIS — Z96649 Presence of unspecified artificial hip joint: Secondary | ICD-10-CM | POA: Insufficient documentation

## 2013-03-05 DIAGNOSIS — M545 Low back pain: Secondary | ICD-10-CM | POA: Insufficient documentation

## 2013-04-29 ENCOUNTER — Other Ambulatory Visit: Payer: Self-pay | Admitting: Obstetrics and Gynecology

## 2013-04-29 ENCOUNTER — Other Ambulatory Visit (HOSPITAL_COMMUNITY)
Admission: RE | Admit: 2013-04-29 | Discharge: 2013-04-29 | Disposition: A | Discharge status: Home / Self Care | Payer: Medicare Other | Source: Ambulatory Visit | Attending: Obstetrics and Gynecology | Admitting: Obstetrics and Gynecology

## 2013-04-29 DIAGNOSIS — Z1151 Encounter for screening for human papillomavirus (HPV): Secondary | ICD-10-CM | POA: Insufficient documentation

## 2013-04-29 DIAGNOSIS — Z01419 Encounter for gynecological examination (general) (routine) without abnormal findings: Secondary | ICD-10-CM | POA: Insufficient documentation

## 2013-09-09 DIAGNOSIS — M25569 Pain in unspecified knee: Secondary | ICD-10-CM | POA: Insufficient documentation

## 2014-01-24 ENCOUNTER — Other Ambulatory Visit: Payer: Self-pay

## 2014-01-24 DIAGNOSIS — Z1231 Encounter for screening mammogram for malignant neoplasm of breast: Secondary | ICD-10-CM

## 2014-02-24 ENCOUNTER — Ambulatory Visit
Admission: RE | Admit: 2014-02-24 | Discharge: 2014-02-24 | Disposition: A | Discharge status: Home / Self Care | Payer: Medicare Other | Source: Ambulatory Visit

## 2014-02-24 ENCOUNTER — Encounter (INDEPENDENT_AMBULATORY_CARE_PROVIDER_SITE_OTHER): Payer: Self-pay

## 2014-02-24 DIAGNOSIS — Z1231 Encounter for screening mammogram for malignant neoplasm of breast: Secondary | ICD-10-CM

## 2014-06-16 ENCOUNTER — Ambulatory Visit (INDEPENDENT_AMBULATORY_CARE_PROVIDER_SITE_OTHER): Payer: Medicare Other | Admitting: Podiatry

## 2014-06-16 ENCOUNTER — Encounter: Payer: Self-pay | Admitting: Podiatry

## 2014-06-16 ENCOUNTER — Ambulatory Visit (INDEPENDENT_AMBULATORY_CARE_PROVIDER_SITE_OTHER): Payer: Medicare Other

## 2014-06-16 VITALS — BP 151/74 | HR 91 | Resp 16

## 2014-06-16 DIAGNOSIS — M2042 Other hammer toe(s) (acquired), left foot: Secondary | ICD-10-CM | POA: Diagnosis not present

## 2014-06-16 DIAGNOSIS — M205X9 Other deformities of toe(s) (acquired), unspecified foot: Secondary | ICD-10-CM

## 2014-06-16 DIAGNOSIS — M2020 Hallux rigidus, unspecified foot: Secondary | ICD-10-CM

## 2014-06-16 DIAGNOSIS — M779 Enthesopathy, unspecified: Secondary | ICD-10-CM

## 2014-06-16 MED ORDER — TRIAMCINOLONE ACETONIDE 10 MG/ML IJ SUSP
10.0000 mg | Freq: Once | INTRAMUSCULAR | Status: AC
  Administered 2014-06-16: 10 mg

## 2014-06-16 NOTE — Progress Notes (Signed)
Subjective:     Patient ID: Teresa Marquez, female   DOB: 04-27-1941, 73 y.o.   MRN: [id number]  HPI patient presents stating I have had structural problems with my big toe joint left with spur formation and pain in the joint. States that it seems to make the big toe hurt in the forefoot hurt but she's not sure where it's coming from   Review of Systems     Objective:   Physical Exam Neurovascular status intact with no change in muscle strength or history and noted to have spurring on top of the first metatarsal left and around the metatarsal phalangeal joint with inflammation. 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 She'll be reevaluated in 4 weeks to see how she responds to treatment

## 2014-07-15 ENCOUNTER — Ambulatory Visit (INDEPENDENT_AMBULATORY_CARE_PROVIDER_SITE_OTHER): Payer: Medicare Other | Admitting: Podiatry

## 2014-07-15 ENCOUNTER — Encounter: Payer: Self-pay | Admitting: Podiatry

## 2014-07-15 DIAGNOSIS — M2020 Hallux rigidus, unspecified foot: Principal | ICD-10-CM

## 2014-07-15 DIAGNOSIS — M2042 Other hammer toe(s) (acquired), left foot: Secondary | ICD-10-CM

## 2014-07-15 DIAGNOSIS — M205X9 Other deformities of toe(s) (acquired), unspecified foot: Secondary | ICD-10-CM | POA: Diagnosis not present

## 2014-07-17 NOTE — Progress Notes (Signed)
Subjective:     Patient ID: Teresa Marquez, female   DOB: Feb 12, 1941, 74 y.o.   MRN: [id number]  HPI patient states it's doing a lot better with discomfort still present but improved from previous   Review of Systems     Objective:   Physical Exam Neurovascular status intact with diminished discomfort around the first MPJ left with spur formation that's present and still get sore in shoe gear    Assessment:     Inflammatory capsulitis hallux limitus condition left that has improved with previous therapy    Plan:     Discussed surgery for this and explained to the patient what would be required in order to remove spurs and remodel the joint and possibly do subchondral drilling. She is going to think about this and at this time try to wear wider-type shoes

## 2015-02-06 ENCOUNTER — Other Ambulatory Visit: Payer: Self-pay

## 2015-02-06 DIAGNOSIS — Z1231 Encounter for screening mammogram for malignant neoplasm of breast: Secondary | ICD-10-CM

## 2015-03-16 ENCOUNTER — Ambulatory Visit
Admission: RE | Admit: 2015-03-16 | Discharge: 2015-03-16 | Disposition: A | Discharge status: Home / Self Care | Payer: Medicare Other | Source: Ambulatory Visit

## 2015-03-16 DIAGNOSIS — M25511 Pain in right shoulder: Secondary | ICD-10-CM

## 2015-03-16 DIAGNOSIS — Z1231 Encounter for screening mammogram for malignant neoplasm of breast: Secondary | ICD-10-CM

## 2015-03-16 DIAGNOSIS — G8929 Other chronic pain: Secondary | ICD-10-CM | POA: Insufficient documentation

## 2015-07-25 DIAGNOSIS — M19011 Primary osteoarthritis, right shoulder: Secondary | ICD-10-CM | POA: Diagnosis not present

## 2015-07-25 DIAGNOSIS — M25511 Pain in right shoulder: Secondary | ICD-10-CM | POA: Diagnosis not present

## 2015-07-25 DIAGNOSIS — G8929 Other chronic pain: Secondary | ICD-10-CM | POA: Diagnosis not present

## 2015-07-26 ENCOUNTER — Other Ambulatory Visit: Payer: Self-pay | Admitting: Orthopedic Surgery

## 2015-07-26 DIAGNOSIS — M25511 Pain in right shoulder: Secondary | ICD-10-CM

## 2015-07-31 ENCOUNTER — Ambulatory Visit
Admission: RE | Admit: 2015-07-31 | Discharge: 2015-07-31 | Disposition: A | Discharge status: Home / Self Care | Payer: Medicare Other | Source: Ambulatory Visit | Attending: Orthopedic Surgery | Admitting: Orthopedic Surgery

## 2015-07-31 DIAGNOSIS — M25511 Pain in right shoulder: Secondary | ICD-10-CM

## 2015-07-31 DIAGNOSIS — S46011A Strain of muscle(s) and tendon(s) of the rotator cuff of right shoulder, initial encounter: Secondary | ICD-10-CM | POA: Diagnosis not present

## 2015-08-03 DIAGNOSIS — M7541 Impingement syndrome of right shoulder: Secondary | ICD-10-CM | POA: Diagnosis not present

## 2015-08-03 DIAGNOSIS — M75111 Incomplete rotator cuff tear or rupture of right shoulder, not specified as traumatic: Secondary | ICD-10-CM | POA: Diagnosis not present

## 2015-08-23 DIAGNOSIS — M75111 Incomplete rotator cuff tear or rupture of right shoulder, not specified as traumatic: Secondary | ICD-10-CM | POA: Diagnosis not present

## 2015-08-23 DIAGNOSIS — M75121 Complete rotator cuff tear or rupture of right shoulder, not specified as traumatic: Secondary | ICD-10-CM | POA: Diagnosis not present

## 2015-08-23 DIAGNOSIS — M94211 Chondromalacia, right shoulder: Secondary | ICD-10-CM | POA: Diagnosis not present

## 2015-08-23 DIAGNOSIS — S46111A Strain of muscle, fascia and tendon of long head of biceps, right arm, initial encounter: Secondary | ICD-10-CM | POA: Diagnosis not present

## 2015-08-23 DIAGNOSIS — S43491A Other sprain of right shoulder joint, initial encounter: Secondary | ICD-10-CM | POA: Diagnosis not present

## 2015-08-23 DIAGNOSIS — M19011 Primary osteoarthritis, right shoulder: Secondary | ICD-10-CM | POA: Diagnosis not present

## 2015-08-23 DIAGNOSIS — G8918 Other acute postprocedural pain: Secondary | ICD-10-CM | POA: Diagnosis not present

## 2015-08-29 DIAGNOSIS — Z9889 Other specified postprocedural states: Secondary | ICD-10-CM | POA: Insufficient documentation

## 2015-08-31 DIAGNOSIS — R531 Weakness: Secondary | ICD-10-CM | POA: Diagnosis not present

## 2015-08-31 DIAGNOSIS — M75101 Unspecified rotator cuff tear or rupture of right shoulder, not specified as traumatic: Secondary | ICD-10-CM | POA: Diagnosis not present

## 2015-08-31 DIAGNOSIS — M25611 Stiffness of right shoulder, not elsewhere classified: Secondary | ICD-10-CM | POA: Diagnosis not present

## 2015-08-31 DIAGNOSIS — M25511 Pain in right shoulder: Secondary | ICD-10-CM | POA: Diagnosis not present

## 2015-09-05 DIAGNOSIS — M25611 Stiffness of right shoulder, not elsewhere classified: Secondary | ICD-10-CM | POA: Diagnosis not present

## 2015-09-05 DIAGNOSIS — M25511 Pain in right shoulder: Secondary | ICD-10-CM | POA: Diagnosis not present

## 2015-09-05 DIAGNOSIS — R531 Weakness: Secondary | ICD-10-CM | POA: Diagnosis not present

## 2015-09-05 DIAGNOSIS — M75101 Unspecified rotator cuff tear or rupture of right shoulder, not specified as traumatic: Secondary | ICD-10-CM | POA: Diagnosis not present

## 2015-09-07 DIAGNOSIS — M25611 Stiffness of right shoulder, not elsewhere classified: Secondary | ICD-10-CM | POA: Diagnosis not present

## 2015-09-07 DIAGNOSIS — R531 Weakness: Secondary | ICD-10-CM | POA: Diagnosis not present

## 2015-09-07 DIAGNOSIS — M75101 Unspecified rotator cuff tear or rupture of right shoulder, not specified as traumatic: Secondary | ICD-10-CM | POA: Diagnosis not present

## 2015-09-07 DIAGNOSIS — M25511 Pain in right shoulder: Secondary | ICD-10-CM | POA: Diagnosis not present

## 2015-09-11 DIAGNOSIS — M75101 Unspecified rotator cuff tear or rupture of right shoulder, not specified as traumatic: Secondary | ICD-10-CM | POA: Diagnosis not present

## 2015-09-11 DIAGNOSIS — R531 Weakness: Secondary | ICD-10-CM | POA: Diagnosis not present

## 2015-09-11 DIAGNOSIS — M25611 Stiffness of right shoulder, not elsewhere classified: Secondary | ICD-10-CM | POA: Diagnosis not present

## 2015-09-11 DIAGNOSIS — M25511 Pain in right shoulder: Secondary | ICD-10-CM | POA: Diagnosis not present

## 2015-09-14 DIAGNOSIS — H00014 Hordeolum externum left upper eyelid: Secondary | ICD-10-CM | POA: Diagnosis not present

## 2015-09-14 DIAGNOSIS — H00024 Hordeolum internum left upper eyelid: Secondary | ICD-10-CM | POA: Diagnosis not present

## 2015-09-14 DIAGNOSIS — M25511 Pain in right shoulder: Secondary | ICD-10-CM | POA: Diagnosis not present

## 2015-09-14 DIAGNOSIS — M25611 Stiffness of right shoulder, not elsewhere classified: Secondary | ICD-10-CM | POA: Diagnosis not present

## 2015-09-14 DIAGNOSIS — R531 Weakness: Secondary | ICD-10-CM | POA: Diagnosis not present

## 2015-09-14 DIAGNOSIS — M75101 Unspecified rotator cuff tear or rupture of right shoulder, not specified as traumatic: Secondary | ICD-10-CM | POA: Diagnosis not present

## 2015-09-18 DIAGNOSIS — R531 Weakness: Secondary | ICD-10-CM | POA: Diagnosis not present

## 2015-09-18 DIAGNOSIS — M25511 Pain in right shoulder: Secondary | ICD-10-CM | POA: Diagnosis not present

## 2015-09-18 DIAGNOSIS — M75101 Unspecified rotator cuff tear or rupture of right shoulder, not specified as traumatic: Secondary | ICD-10-CM | POA: Diagnosis not present

## 2015-09-18 DIAGNOSIS — M25611 Stiffness of right shoulder, not elsewhere classified: Secondary | ICD-10-CM | POA: Diagnosis not present

## 2015-09-21 DIAGNOSIS — M25511 Pain in right shoulder: Secondary | ICD-10-CM | POA: Diagnosis not present

## 2015-09-21 DIAGNOSIS — R531 Weakness: Secondary | ICD-10-CM | POA: Diagnosis not present

## 2015-09-21 DIAGNOSIS — M25611 Stiffness of right shoulder, not elsewhere classified: Secondary | ICD-10-CM | POA: Diagnosis not present

## 2015-09-21 DIAGNOSIS — M75101 Unspecified rotator cuff tear or rupture of right shoulder, not specified as traumatic: Secondary | ICD-10-CM | POA: Diagnosis not present

## 2015-09-25 DIAGNOSIS — M75101 Unspecified rotator cuff tear or rupture of right shoulder, not specified as traumatic: Secondary | ICD-10-CM | POA: Diagnosis not present

## 2015-09-25 DIAGNOSIS — M25611 Stiffness of right shoulder, not elsewhere classified: Secondary | ICD-10-CM | POA: Diagnosis not present

## 2015-09-25 DIAGNOSIS — M25511 Pain in right shoulder: Secondary | ICD-10-CM | POA: Diagnosis not present

## 2015-09-25 DIAGNOSIS — R531 Weakness: Secondary | ICD-10-CM | POA: Diagnosis not present

## 2015-09-28 DIAGNOSIS — R531 Weakness: Secondary | ICD-10-CM | POA: Diagnosis not present

## 2015-09-28 DIAGNOSIS — M25611 Stiffness of right shoulder, not elsewhere classified: Secondary | ICD-10-CM | POA: Diagnosis not present

## 2015-09-28 DIAGNOSIS — M75101 Unspecified rotator cuff tear or rupture of right shoulder, not specified as traumatic: Secondary | ICD-10-CM | POA: Diagnosis not present

## 2015-09-28 DIAGNOSIS — M25511 Pain in right shoulder: Secondary | ICD-10-CM | POA: Diagnosis not present

## 2015-10-02 DIAGNOSIS — M25511 Pain in right shoulder: Secondary | ICD-10-CM | POA: Diagnosis not present

## 2015-10-02 DIAGNOSIS — R531 Weakness: Secondary | ICD-10-CM | POA: Diagnosis not present

## 2015-10-02 DIAGNOSIS — M75101 Unspecified rotator cuff tear or rupture of right shoulder, not specified as traumatic: Secondary | ICD-10-CM | POA: Diagnosis not present

## 2015-10-02 DIAGNOSIS — M25611 Stiffness of right shoulder, not elsewhere classified: Secondary | ICD-10-CM | POA: Diagnosis not present

## 2015-10-05 DIAGNOSIS — M25611 Stiffness of right shoulder, not elsewhere classified: Secondary | ICD-10-CM | POA: Diagnosis not present

## 2015-10-05 DIAGNOSIS — R531 Weakness: Secondary | ICD-10-CM | POA: Diagnosis not present

## 2015-10-05 DIAGNOSIS — M25511 Pain in right shoulder: Secondary | ICD-10-CM | POA: Diagnosis not present

## 2015-10-05 DIAGNOSIS — M75101 Unspecified rotator cuff tear or rupture of right shoulder, not specified as traumatic: Secondary | ICD-10-CM | POA: Diagnosis not present

## 2015-10-10 DIAGNOSIS — R531 Weakness: Secondary | ICD-10-CM | POA: Diagnosis not present

## 2015-10-10 DIAGNOSIS — M75101 Unspecified rotator cuff tear or rupture of right shoulder, not specified as traumatic: Secondary | ICD-10-CM | POA: Diagnosis not present

## 2015-10-10 DIAGNOSIS — M25511 Pain in right shoulder: Secondary | ICD-10-CM | POA: Diagnosis not present

## 2015-10-10 DIAGNOSIS — M25611 Stiffness of right shoulder, not elsewhere classified: Secondary | ICD-10-CM | POA: Diagnosis not present

## 2015-10-12 DIAGNOSIS — M25611 Stiffness of right shoulder, not elsewhere classified: Secondary | ICD-10-CM | POA: Diagnosis not present

## 2015-10-12 DIAGNOSIS — R531 Weakness: Secondary | ICD-10-CM | POA: Diagnosis not present

## 2015-10-12 DIAGNOSIS — M75101 Unspecified rotator cuff tear or rupture of right shoulder, not specified as traumatic: Secondary | ICD-10-CM | POA: Diagnosis not present

## 2015-10-12 DIAGNOSIS — M25511 Pain in right shoulder: Secondary | ICD-10-CM | POA: Diagnosis not present

## 2015-10-16 DIAGNOSIS — R531 Weakness: Secondary | ICD-10-CM | POA: Diagnosis not present

## 2015-10-16 DIAGNOSIS — M75101 Unspecified rotator cuff tear or rupture of right shoulder, not specified as traumatic: Secondary | ICD-10-CM | POA: Diagnosis not present

## 2015-10-16 DIAGNOSIS — M25611 Stiffness of right shoulder, not elsewhere classified: Secondary | ICD-10-CM | POA: Diagnosis not present

## 2015-10-16 DIAGNOSIS — M25511 Pain in right shoulder: Secondary | ICD-10-CM | POA: Diagnosis not present

## 2015-10-24 DIAGNOSIS — R531 Weakness: Secondary | ICD-10-CM | POA: Diagnosis not present

## 2015-10-24 DIAGNOSIS — M25511 Pain in right shoulder: Secondary | ICD-10-CM | POA: Diagnosis not present

## 2015-10-24 DIAGNOSIS — M25611 Stiffness of right shoulder, not elsewhere classified: Secondary | ICD-10-CM | POA: Diagnosis not present

## 2015-10-24 DIAGNOSIS — M75101 Unspecified rotator cuff tear or rupture of right shoulder, not specified as traumatic: Secondary | ICD-10-CM | POA: Diagnosis not present

## 2015-10-31 DIAGNOSIS — M75101 Unspecified rotator cuff tear or rupture of right shoulder, not specified as traumatic: Secondary | ICD-10-CM | POA: Diagnosis not present

## 2015-10-31 DIAGNOSIS — R531 Weakness: Secondary | ICD-10-CM | POA: Diagnosis not present

## 2015-10-31 DIAGNOSIS — M25611 Stiffness of right shoulder, not elsewhere classified: Secondary | ICD-10-CM | POA: Diagnosis not present

## 2015-10-31 DIAGNOSIS — M25511 Pain in right shoulder: Secondary | ICD-10-CM | POA: Diagnosis not present

## 2015-11-06 DIAGNOSIS — H40023 Open angle with borderline findings, high risk, bilateral: Secondary | ICD-10-CM | POA: Diagnosis not present

## 2015-11-07 DIAGNOSIS — R531 Weakness: Secondary | ICD-10-CM | POA: Diagnosis not present

## 2015-11-07 DIAGNOSIS — M25511 Pain in right shoulder: Secondary | ICD-10-CM | POA: Diagnosis not present

## 2015-11-07 DIAGNOSIS — M25611 Stiffness of right shoulder, not elsewhere classified: Secondary | ICD-10-CM | POA: Diagnosis not present

## 2015-11-07 DIAGNOSIS — M75101 Unspecified rotator cuff tear or rupture of right shoulder, not specified as traumatic: Secondary | ICD-10-CM | POA: Diagnosis not present

## 2015-11-14 DIAGNOSIS — M25611 Stiffness of right shoulder, not elsewhere classified: Secondary | ICD-10-CM | POA: Diagnosis not present

## 2015-11-14 DIAGNOSIS — R531 Weakness: Secondary | ICD-10-CM | POA: Diagnosis not present

## 2015-11-14 DIAGNOSIS — M25511 Pain in right shoulder: Secondary | ICD-10-CM | POA: Diagnosis not present

## 2015-11-14 DIAGNOSIS — M75101 Unspecified rotator cuff tear or rupture of right shoulder, not specified as traumatic: Secondary | ICD-10-CM | POA: Diagnosis not present

## 2015-11-28 DIAGNOSIS — R531 Weakness: Secondary | ICD-10-CM | POA: Diagnosis not present

## 2015-11-28 DIAGNOSIS — M25611 Stiffness of right shoulder, not elsewhere classified: Secondary | ICD-10-CM | POA: Diagnosis not present

## 2015-11-28 DIAGNOSIS — M25511 Pain in right shoulder: Secondary | ICD-10-CM | POA: Diagnosis not present

## 2015-11-28 DIAGNOSIS — M75101 Unspecified rotator cuff tear or rupture of right shoulder, not specified as traumatic: Secondary | ICD-10-CM | POA: Diagnosis not present

## 2015-12-11 DIAGNOSIS — R531 Weakness: Secondary | ICD-10-CM | POA: Diagnosis not present

## 2015-12-11 DIAGNOSIS — M75101 Unspecified rotator cuff tear or rupture of right shoulder, not specified as traumatic: Secondary | ICD-10-CM | POA: Diagnosis not present

## 2015-12-11 DIAGNOSIS — M25611 Stiffness of right shoulder, not elsewhere classified: Secondary | ICD-10-CM | POA: Diagnosis not present

## 2015-12-11 DIAGNOSIS — M25511 Pain in right shoulder: Secondary | ICD-10-CM | POA: Diagnosis not present

## 2015-12-21 DIAGNOSIS — Z79899 Other long term (current) drug therapy: Secondary | ICD-10-CM | POA: Diagnosis not present

## 2015-12-21 DIAGNOSIS — E118 Type 2 diabetes mellitus with unspecified complications: Secondary | ICD-10-CM | POA: Diagnosis not present

## 2015-12-21 DIAGNOSIS — Z0000 Encounter for general adult medical examination without abnormal findings: Secondary | ICD-10-CM | POA: Diagnosis not present

## 2015-12-21 DIAGNOSIS — E789 Disorder of lipoprotein metabolism, unspecified: Secondary | ICD-10-CM | POA: Diagnosis not present

## 2015-12-29 DIAGNOSIS — E789 Disorder of lipoprotein metabolism, unspecified: Secondary | ICD-10-CM | POA: Diagnosis not present

## 2015-12-29 DIAGNOSIS — M25519 Pain in unspecified shoulder: Secondary | ICD-10-CM | POA: Diagnosis not present

## 2015-12-29 DIAGNOSIS — K219 Gastro-esophageal reflux disease without esophagitis: Secondary | ICD-10-CM | POA: Diagnosis not present

## 2016-01-22 DIAGNOSIS — B079 Viral wart, unspecified: Secondary | ICD-10-CM | POA: Diagnosis not present

## 2016-01-22 DIAGNOSIS — X32XXXD Exposure to sunlight, subsequent encounter: Secondary | ICD-10-CM | POA: Diagnosis not present

## 2016-01-22 DIAGNOSIS — Z1283 Encounter for screening for malignant neoplasm of skin: Secondary | ICD-10-CM | POA: Diagnosis not present

## 2016-01-22 DIAGNOSIS — L570 Actinic keratosis: Secondary | ICD-10-CM | POA: Diagnosis not present

## 2016-02-19 ENCOUNTER — Other Ambulatory Visit: Payer: Self-pay | Admitting: Obstetrics and Gynecology

## 2016-02-19 DIAGNOSIS — Z1231 Encounter for screening mammogram for malignant neoplasm of breast: Secondary | ICD-10-CM

## 2016-03-19 ENCOUNTER — Ambulatory Visit: Payer: Medicare Other

## 2016-03-25 DIAGNOSIS — L0292 Furuncle, unspecified: Secondary | ICD-10-CM | POA: Diagnosis not present

## 2016-04-02 ENCOUNTER — Ambulatory Visit
Admission: RE | Admit: 2016-04-02 | Discharge: 2016-04-02 | Disposition: A | Discharge status: Home / Self Care | Payer: Medicare Other | Source: Ambulatory Visit | Attending: Obstetrics and Gynecology | Admitting: Obstetrics and Gynecology

## 2016-04-02 DIAGNOSIS — Z1231 Encounter for screening mammogram for malignant neoplasm of breast: Secondary | ICD-10-CM | POA: Diagnosis not present

## 2016-05-08 DIAGNOSIS — D3101 Benign neoplasm of right conjunctiva: Secondary | ICD-10-CM | POA: Diagnosis not present

## 2016-05-08 DIAGNOSIS — H2513 Age-related nuclear cataract, bilateral: Secondary | ICD-10-CM | POA: Diagnosis not present

## 2016-05-08 DIAGNOSIS — H16223 Keratoconjunctivitis sicca, not specified as Sjogren's, bilateral: Secondary | ICD-10-CM | POA: Diagnosis not present

## 2016-05-08 DIAGNOSIS — H40013 Open angle with borderline findings, low risk, bilateral: Secondary | ICD-10-CM | POA: Diagnosis not present

## 2016-05-13 DIAGNOSIS — Z01411 Encounter for gynecological examination (general) (routine) with abnormal findings: Secondary | ICD-10-CM | POA: Diagnosis not present

## 2016-05-13 DIAGNOSIS — N816 Rectocele: Secondary | ICD-10-CM | POA: Diagnosis not present

## 2016-05-13 DIAGNOSIS — K649 Unspecified hemorrhoids: Secondary | ICD-10-CM | POA: Diagnosis not present

## 2016-06-04 DIAGNOSIS — L723 Sebaceous cyst: Secondary | ICD-10-CM | POA: Diagnosis not present

## 2016-06-04 DIAGNOSIS — B079 Viral wart, unspecified: Secondary | ICD-10-CM | POA: Diagnosis not present

## 2016-06-04 DIAGNOSIS — B078 Other viral warts: Secondary | ICD-10-CM | POA: Diagnosis not present

## 2016-06-10 DIAGNOSIS — E118 Type 2 diabetes mellitus with unspecified complications: Secondary | ICD-10-CM | POA: Diagnosis not present

## 2016-06-10 DIAGNOSIS — E789 Disorder of lipoprotein metabolism, unspecified: Secondary | ICD-10-CM | POA: Diagnosis not present

## 2016-06-13 DIAGNOSIS — Z86018 Personal history of other benign neoplasm: Secondary | ICD-10-CM | POA: Diagnosis not present

## 2016-06-13 DIAGNOSIS — J329 Chronic sinusitis, unspecified: Secondary | ICD-10-CM | POA: Diagnosis not present

## 2016-06-13 DIAGNOSIS — E789 Disorder of lipoprotein metabolism, unspecified: Secondary | ICD-10-CM | POA: Diagnosis not present

## 2016-07-03 ENCOUNTER — Other Ambulatory Visit: Payer: Self-pay | Admitting: Endocrinology

## 2016-07-04 ENCOUNTER — Other Ambulatory Visit: Payer: Self-pay | Admitting: Endocrinology

## 2016-07-04 DIAGNOSIS — Z87898 Personal history of other specified conditions: Secondary | ICD-10-CM

## 2016-07-11 ENCOUNTER — Ambulatory Visit
Admission: RE | Admit: 2016-07-11 | Discharge: 2016-07-11 | Disposition: A | Discharge status: Home / Self Care | Payer: Medicare Other | Source: Ambulatory Visit | Attending: Endocrinology | Admitting: Endocrinology

## 2016-07-11 DIAGNOSIS — Z87898 Personal history of other specified conditions: Secondary | ICD-10-CM

## 2016-07-11 DIAGNOSIS — R930 Abnormal findings on diagnostic imaging of skull and head, not elsewhere classified: Secondary | ICD-10-CM | POA: Diagnosis not present

## 2016-07-11 MED ORDER — GADOBENATE DIMEGLUMINE 529 MG/ML IV SOLN
10.0000 mL | Freq: Once | INTRAVENOUS | Status: AC | PRN
  Administered 2016-07-11: 10 mL via INTRAVENOUS

## 2016-08-07 DIAGNOSIS — Z8639 Personal history of other endocrine, nutritional and metabolic disease: Secondary | ICD-10-CM | POA: Diagnosis not present

## 2016-08-07 DIAGNOSIS — J310 Chronic rhinitis: Secondary | ICD-10-CM | POA: Diagnosis not present

## 2016-08-07 DIAGNOSIS — Z86018 Personal history of other benign neoplasm: Secondary | ICD-10-CM | POA: Insufficient documentation

## 2016-10-01 DIAGNOSIS — G4733 Obstructive sleep apnea (adult) (pediatric): Secondary | ICD-10-CM | POA: Insufficient documentation

## 2016-10-01 DIAGNOSIS — J310 Chronic rhinitis: Secondary | ICD-10-CM | POA: Diagnosis not present

## 2016-10-01 DIAGNOSIS — Z1283 Encounter for screening for malignant neoplasm of skin: Secondary | ICD-10-CM | POA: Diagnosis not present

## 2016-10-01 DIAGNOSIS — D225 Melanocytic nevi of trunk: Secondary | ICD-10-CM | POA: Diagnosis not present

## 2016-10-01 DIAGNOSIS — K098 Other cysts of oral region, not elsewhere classified: Secondary | ICD-10-CM | POA: Diagnosis not present

## 2016-10-01 DIAGNOSIS — K219 Gastro-esophageal reflux disease without esophagitis: Secondary | ICD-10-CM | POA: Diagnosis not present

## 2016-10-01 DIAGNOSIS — L821 Other seborrheic keratosis: Secondary | ICD-10-CM | POA: Diagnosis not present

## 2016-11-01 DIAGNOSIS — H40023 Open angle with borderline findings, high risk, bilateral: Secondary | ICD-10-CM | POA: Diagnosis not present

## 2016-11-12 DIAGNOSIS — M4312 Spondylolisthesis, cervical region: Secondary | ICD-10-CM | POA: Diagnosis not present

## 2016-11-12 DIAGNOSIS — M47892 Other spondylosis, cervical region: Secondary | ICD-10-CM | POA: Diagnosis not present

## 2016-11-12 DIAGNOSIS — M79601 Pain in right arm: Secondary | ICD-10-CM | POA: Diagnosis not present

## 2016-11-12 DIAGNOSIS — M5412 Radiculopathy, cervical region: Secondary | ICD-10-CM | POA: Insufficient documentation

## 2016-11-12 DIAGNOSIS — M50322 Other cervical disc degeneration at C5-C6 level: Secondary | ICD-10-CM | POA: Diagnosis not present

## 2016-11-12 DIAGNOSIS — Z4789 Encounter for other orthopedic aftercare: Secondary | ICD-10-CM | POA: Diagnosis not present

## 2016-11-12 DIAGNOSIS — M50323 Other cervical disc degeneration at C6-C7 level: Secondary | ICD-10-CM | POA: Diagnosis not present

## 2016-11-12 DIAGNOSIS — M5030 Other cervical disc degeneration, unspecified cervical region: Secondary | ICD-10-CM | POA: Diagnosis not present

## 2016-11-20 ENCOUNTER — Other Ambulatory Visit: Payer: Self-pay | Admitting: Orthopedic Surgery

## 2016-11-20 DIAGNOSIS — R200 Anesthesia of skin: Secondary | ICD-10-CM

## 2016-11-20 DIAGNOSIS — R202 Paresthesia of skin: Secondary | ICD-10-CM

## 2016-11-20 DIAGNOSIS — M542 Cervicalgia: Secondary | ICD-10-CM

## 2016-11-28 ENCOUNTER — Ambulatory Visit
Admission: RE | Admit: 2016-11-28 | Discharge: 2016-11-28 | Disposition: A | Discharge status: Home / Self Care | Payer: Medicare Other | Source: Ambulatory Visit | Attending: Orthopedic Surgery | Admitting: Orthopedic Surgery

## 2016-11-28 DIAGNOSIS — R200 Anesthesia of skin: Secondary | ICD-10-CM

## 2016-11-28 DIAGNOSIS — R202 Paresthesia of skin: Secondary | ICD-10-CM

## 2016-11-28 DIAGNOSIS — M542 Cervicalgia: Secondary | ICD-10-CM

## 2016-11-28 DIAGNOSIS — M4802 Spinal stenosis, cervical region: Secondary | ICD-10-CM | POA: Diagnosis not present

## 2016-12-05 DIAGNOSIS — E789 Disorder of lipoprotein metabolism, unspecified: Secondary | ICD-10-CM | POA: Diagnosis not present

## 2016-12-05 DIAGNOSIS — Z79899 Other long term (current) drug therapy: Secondary | ICD-10-CM | POA: Diagnosis not present

## 2016-12-05 DIAGNOSIS — E118 Type 2 diabetes mellitus with unspecified complications: Secondary | ICD-10-CM | POA: Diagnosis not present

## 2016-12-06 DIAGNOSIS — M5020 Other cervical disc displacement, unspecified cervical region: Secondary | ICD-10-CM | POA: Diagnosis not present

## 2016-12-06 DIAGNOSIS — M5010 Cervical disc disorder with radiculopathy, unspecified cervical region: Secondary | ICD-10-CM | POA: Diagnosis not present

## 2016-12-12 DIAGNOSIS — E118 Type 2 diabetes mellitus with unspecified complications: Secondary | ICD-10-CM | POA: Diagnosis not present

## 2016-12-12 DIAGNOSIS — E789 Disorder of lipoprotein metabolism, unspecified: Secondary | ICD-10-CM | POA: Diagnosis not present

## 2016-12-12 DIAGNOSIS — M542 Cervicalgia: Secondary | ICD-10-CM | POA: Diagnosis not present

## 2016-12-18 DIAGNOSIS — M5412 Radiculopathy, cervical region: Secondary | ICD-10-CM | POA: Diagnosis not present

## 2017-01-13 DIAGNOSIS — M5412 Radiculopathy, cervical region: Secondary | ICD-10-CM | POA: Diagnosis not present

## 2017-01-15 DIAGNOSIS — L304 Erythema intertrigo: Secondary | ICD-10-CM | POA: Diagnosis not present

## 2017-01-15 DIAGNOSIS — L820 Inflamed seborrheic keratosis: Secondary | ICD-10-CM | POA: Diagnosis not present

## 2017-01-19 DIAGNOSIS — M5412 Radiculopathy, cervical region: Secondary | ICD-10-CM | POA: Diagnosis not present

## 2017-01-28 DIAGNOSIS — M5412 Radiculopathy, cervical region: Secondary | ICD-10-CM | POA: Diagnosis not present

## 2017-02-06 DIAGNOSIS — M5412 Radiculopathy, cervical region: Secondary | ICD-10-CM | POA: Diagnosis not present

## 2017-02-21 DIAGNOSIS — M5412 Radiculopathy, cervical region: Secondary | ICD-10-CM | POA: Diagnosis not present

## 2017-02-25 ENCOUNTER — Other Ambulatory Visit: Payer: Self-pay | Admitting: Obstetrics and Gynecology

## 2017-02-25 DIAGNOSIS — Z1231 Encounter for screening mammogram for malignant neoplasm of breast: Secondary | ICD-10-CM

## 2017-03-06 DIAGNOSIS — M5412 Radiculopathy, cervical region: Secondary | ICD-10-CM | POA: Diagnosis not present

## 2017-04-03 ENCOUNTER — Ambulatory Visit
Admission: RE | Admit: 2017-04-03 | Discharge: 2017-04-03 | Disposition: A | Discharge status: Home / Self Care | Payer: Medicare Other | Source: Ambulatory Visit | Attending: Obstetrics and Gynecology | Admitting: Obstetrics and Gynecology

## 2017-04-03 ENCOUNTER — Encounter: Payer: Self-pay | Admitting: Radiology

## 2017-04-03 DIAGNOSIS — Z1231 Encounter for screening mammogram for malignant neoplasm of breast: Secondary | ICD-10-CM | POA: Diagnosis not present

## 2017-04-04 DIAGNOSIS — E118 Type 2 diabetes mellitus with unspecified complications: Secondary | ICD-10-CM | POA: Diagnosis not present

## 2017-04-04 DIAGNOSIS — E789 Disorder of lipoprotein metabolism, unspecified: Secondary | ICD-10-CM | POA: Diagnosis not present

## 2017-04-04 DIAGNOSIS — M542 Cervicalgia: Secondary | ICD-10-CM | POA: Diagnosis not present

## 2017-04-15 DIAGNOSIS — E1165 Type 2 diabetes mellitus with hyperglycemia: Secondary | ICD-10-CM | POA: Diagnosis not present

## 2017-04-15 DIAGNOSIS — E782 Mixed hyperlipidemia: Secondary | ICD-10-CM | POA: Diagnosis not present

## 2017-04-29 DIAGNOSIS — H524 Presbyopia: Secondary | ICD-10-CM | POA: Diagnosis not present

## 2017-04-29 DIAGNOSIS — H04123 Dry eye syndrome of bilateral lacrimal glands: Secondary | ICD-10-CM | POA: Diagnosis not present

## 2017-04-29 DIAGNOSIS — H2513 Age-related nuclear cataract, bilateral: Secondary | ICD-10-CM | POA: Diagnosis not present

## 2017-04-29 DIAGNOSIS — E119 Type 2 diabetes mellitus without complications: Secondary | ICD-10-CM | POA: Diagnosis not present

## 2017-04-29 DIAGNOSIS — H40023 Open angle with borderline findings, high risk, bilateral: Secondary | ICD-10-CM | POA: Diagnosis not present

## 2017-05-08 ENCOUNTER — Ambulatory Visit: Payer: Medicare Other | Admitting: Podiatry

## 2017-05-14 DIAGNOSIS — N816 Rectocele: Secondary | ICD-10-CM | POA: Diagnosis not present

## 2017-05-14 DIAGNOSIS — Z01411 Encounter for gynecological examination (general) (routine) with abnormal findings: Secondary | ICD-10-CM | POA: Diagnosis not present

## 2017-05-20 ENCOUNTER — Encounter: Payer: Self-pay | Admitting: Dietician

## 2017-05-20 ENCOUNTER — Encounter: Payer: Medicare Other | Attending: Internal Medicine | Admitting: Dietician

## 2017-05-20 DIAGNOSIS — Z713 Dietary counseling and surveillance: Secondary | ICD-10-CM | POA: Diagnosis not present

## 2017-05-20 DIAGNOSIS — E119 Type 2 diabetes mellitus without complications: Secondary | ICD-10-CM | POA: Diagnosis not present

## 2017-05-20 NOTE — Progress Notes (Signed)
Diabetes Self-Management Education  Visit Type: First/Initial  Appt. Start Time: 0945 Appt. End Time: 1130  05/20/2017  Teresa Marquez, identified by name and date of birth, is a 76 y.o. female with a diagnosis of Diabetes: Type 2. Other history includes hyperlipidemia, GERD, and history of a pituitary gland tumor. Weight 04/15/17 was 189 lbs,  Weight today 179 lbs.    Patient's son lives with her most days except weekends.  She does her own shopping and cooking.  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  She enjoys baking sourdough.  She dislikes many vegetables and does not eat processed meat often.   ASSESSMENT  Height 5\' 4"  (1.626 m), weight 178 lb (80.7 kg). Body mass index is 30.55 kg/m.  Diabetes Self-Management Education - 05/20/17 1011      Visit Information   Visit Type  First/Initial      Initial Visit   Diabetes Type  Type 2    Are you currently following a meal plan?  No just trying to eliminate sugar and white starch   just trying to eliminate sugar and white starch   Are you taking your medications as prescribed?  Not on Medications    Date Diagnosed  04/2017      Health Coping   How would you rate your overall health?  Good      Psychosocial Assessment   Patient Belief/Attitude about Diabetes  Other (comment) angry   angry   Self-care barriers  None    Self-management support  Doctor's office    Other persons present  Patient    Patient Concerns  Nutrition/Meal planning;Glycemic Control    Special Needs  None    Preferred Learning Style  No preference indicated    Learning Readiness  Change in progress    How often do you need to have someone help you when you read instructions, pamphlets, or other written materials from your doctor or pharmacy?  1 - Never    What is the last grade level you completed in school?  1 1/2 years college      Pre-Education Assessment   Patient understands the diabetes  disease and treatment process.  Needs Instruction    Patient understands incorporating nutritional management into lifestyle.  Needs Instruction    Patient undertands incorporating physical activity into lifestyle.  Needs Instruction    Patient understands using medications safely.  Needs Instruction    Patient understands monitoring blood glucose, interpreting and using results  Needs Instruction    Patient understands prevention, detection, and treatment of acute complications.  Needs Instruction    Patient understands prevention, detection, and treatment of chronic complications.  Needs Instruction    Patient understands how to develop strategies to address psychosocial issues.  Needs Instruction    Patient understands how to develop strategies to promote health/change behavior.  Needs Instruction      Complications   Last HgB A1C per patient/outside source  6.9 % 04/2017   04/2017   How often do you check your blood sugar?  0 times/day (not testing)    Have you had a dilated eye exam in the past 12 months?  Yes    Have you had a dental exam in the past 12 months?  Yes    Are you checking your feet?  No      Dietary Intake   Breakfast  Steel cut oatmeal, small amount butter, cinnamon, sweet and low, small  amount milk OR eggs fried in olive oil, [employer] toast, small amount smart balance OR cereal and 2% milk AND coffee (black)    Snack (morning)  none    Lunch  banana sandwich or grilled cheese or leftovers AND tomato V-8    Snack (afternoon)  fruit occasionally or dark chocolate    Dinner  cream of chicken soup with extra chicken and butter beans OR hamburger patty. 1/2 bun and occasional piece of dark chocolate or small slice of cake    Snack (evening)  none    Beverage(s)  water, water with 1 Tablespoon vinegar, black coffee, diet peach tea, coke zero, 2% milk (1/2 cup daily)      Exercise   Exercise Type  Light (walking / raking leaves) [employer]   How many days per week to you  exercise?  3    How many minutes per day do you exercise?  60    Total minutes per week of exercise  180      Patient Education   Previous Diabetes Education  No    Disease state   Definition of diabetes, type 1 and 2, and the diagnosis of diabetes;Factors that contribute to the development of diabetes    Nutrition management   Role of diet in the treatment of diabetes and the relationship between the three main macronutrients and blood glucose level;Carbohydrate counting;Meal options for control of blood glucose level and chronic complications.;Food label reading, portion sizes and measuring food.    Physical activity and exercise   Role of exercise on diabetes management, blood pressure control and cardiac health.    Medications  Other (comment) reviewed metformin option   reviewed metformin option   Monitoring  Purpose and frequency of SMBG.;Identified appropriate SMBG and/or A1C goals.;Daily foot exams;Yearly dilated eye exam    Chronic complications  Lipid levels, blood glucose control and heart disease;Relationship between chronic complications and blood glucose control;Identified and discussed with patient  current chronic complications    Psychosocial adjustment  Role of stress on diabetes      Individualized Goals (developed by patient)   Nutrition  General guidelines for healthy choices and portions discussed    Physical Activity  Exercise 5-7 days per week;30 minutes per day    Medications  Not Applicable    Monitoring   Other (comment) consider   consider   Reducing Risk  get labs drawn    Health Coping  discuss diabetes with (comment) MD/CDE/RD   MD/CDE/RD     Post-Education Assessment   Patient understands the diabetes disease and treatment process.  Demonstrates understanding / competency    Patient understands incorporating nutritional management into lifestyle.  Demonstrates understanding / competency    Patient undertands incorporating physical activity into lifestyle.   Demonstrates understanding / competency    Patient understands using medications safely.  Demonstrates understanding / competency    Patient understands monitoring blood glucose, interpreting and using results  Demonstrates understanding / competency    Patient understands prevention, detection, and treatment of acute complications.  Demonstrates understanding / competency    Patient understands prevention, detection, and treatment of chronic complications.  Demonstrates understanding / competency    Patient understands how to develop strategies to address psychosocial issues.  Demonstrates understanding / competency      Outcomes   Expected Outcomes  Demonstrated interest in learning. Expect positive outcomes    Future DMSE  PRN    Program Status  Completed  Individualized Plan for Diabetes Self-Management Training:   Learning Objective:  Patient will have a greater understanding of diabetes self-management. Patient education plan is to attend individual and/or group sessions per assessed needs and concerns.   Plan:   Patient Instructions  Continue an active lifestyle. Continue to be mindful about your food choices.  Aim for 2-3 Carb Choices per meal (30-45 grams) +/- 1 either way  Aim for 0-1 Carbs per snack if hungry  Include protein in moderation with your meals and snacks Consider reading food labels for Total Carbohydrate and Fat Grams of foods Consider checking BG at alternate times per day as directed by MD        Expected Outcomes:  Demonstrated interest in learning. Expect positive outcomes  Education material provided: Living Well with Diabetes, Food label handouts, A1C conversion sheet, Meal plan card, My Plate and Snack sheet, Cholesterol and Triglycerides  If problems or questions, patient to contact team via:  Phone  Future DSME appointment: PRN

## 2017-05-20 NOTE — Patient Instructions (Signed)
Continue an active lifestyle. Continue to be mindful about your food choices.  Aim for 2-3 Carb Choices per meal (30-45 grams) +/- 1 either way  Aim for 0-1 Carbs per snack if hungry  Include protein in moderation with your meals and snacks Consider reading food labels for Total Carbohydrate and Fat Grams of foods Consider checking BG at alternate times per day as directed by MD

## 2017-05-27 ENCOUNTER — Ambulatory Visit: Payer: Medicare Other

## 2017-06-03 ENCOUNTER — Ambulatory Visit: Payer: Medicare Other

## 2017-06-11 ENCOUNTER — Ambulatory Visit (INDEPENDENT_AMBULATORY_CARE_PROVIDER_SITE_OTHER): Payer: Medicare Other | Admitting: Podiatry

## 2017-06-11 ENCOUNTER — Ambulatory Visit (INDEPENDENT_AMBULATORY_CARE_PROVIDER_SITE_OTHER): Payer: Medicare Other

## 2017-06-11 ENCOUNTER — Encounter: Payer: Self-pay | Admitting: Podiatry

## 2017-06-11 DIAGNOSIS — M205X9 Other deformities of toe(s) (acquired), unspecified foot: Secondary | ICD-10-CM

## 2017-06-11 DIAGNOSIS — M722 Plantar fascial fibromatosis: Secondary | ICD-10-CM

## 2017-06-11 MED ORDER — LEVOFLOXACIN 500 MG PO TABS: 500.0000 mg | ORAL_TABLET | Freq: Every day | ORAL | 0 refills | Status: DC

## 2017-06-11 NOTE — Progress Notes (Signed)
She presents today chief complaint of pain to the first metatarsophalangeal joint and fifth metatarsophalangeal joints of the left foot.  She was told before that she has osteoarthritis of the first metatarsophalangeal joint and would eventually need surgery.  She states it is been more painful lately but she is trying to get her blood sugar under better control.  She is also complaining of plantar right heel pain times the past several months and again is trying to get her blood sugar under good control.  She is done nothing to instigate the fasciitis she feels.  She also states that she is having some neuropathy but she feels that this is associated with Crestor rather than the diabetes.  Objective: Vital signs are stable alert and oriented x3.  Pulses are palpable.  Neurologic sensorium is intact.  Tendon reflexes are intact.  Muscle strength was 5/5 dorsiflexion and plantar flexors inverters and everters all intrinsic musculature is intact.  Orthopedic evaluation demonstrates all joints distal to the ankle for range of motion without crepitation with exception of the first metatarsophalangeal joint left greater than right very little in the range of motion of the first metatarsophalangeal joint left.  Radiographs taken today of the bilateral foot demonstrates soft tissue increase in density of the plantar fascia calcaneal insertion site of the right heel but moderate to severe osteoarthritic changes of the first metatarsophalangeal joint of the left foot.  Joint space narrowing subchondral sclerosis dorsal spurring is noted.  Assessment: Osteoarthritis first metatarsophalangeal joint left.  Plantar fasciitis right.  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  Plan: Discussed etiology pathology conservative versus surgical therapies.  At this point I recommended injections that she declined recommended surgery she declined placed her in a plantar fascial brace for the right foot and we will  send her a compounded cream for the neuropathy.  I will follow-up with her as needed.

## 2017-07-22 DIAGNOSIS — E782 Mixed hyperlipidemia: Secondary | ICD-10-CM | POA: Diagnosis not present

## 2017-07-22 DIAGNOSIS — E1165 Type 2 diabetes mellitus with hyperglycemia: Secondary | ICD-10-CM | POA: Diagnosis not present

## 2017-07-29 DIAGNOSIS — E782 Mixed hyperlipidemia: Secondary | ICD-10-CM | POA: Diagnosis not present

## 2017-07-29 DIAGNOSIS — E1165 Type 2 diabetes mellitus with hyperglycemia: Secondary | ICD-10-CM | POA: Diagnosis not present

## 2017-08-06 ENCOUNTER — Ambulatory Visit (INDEPENDENT_AMBULATORY_CARE_PROVIDER_SITE_OTHER): Payer: Medicare Other | Admitting: Podiatry

## 2017-08-06 ENCOUNTER — Encounter: Payer: Self-pay | Admitting: Podiatry

## 2017-08-06 DIAGNOSIS — M205X2 Other deformities of toe(s) (acquired), left foot: Secondary | ICD-10-CM | POA: Diagnosis not present

## 2017-08-06 DIAGNOSIS — L600 Ingrowing nail: Secondary | ICD-10-CM

## 2017-08-06 NOTE — Progress Notes (Signed)
She presents today after having considered surgical intervention regarding her left foot.  States that this seems to be getting worse as time goes on and is becoming more painful.  If it were not painful I would not do surgery.  She would like to have this done as soon as possible.  She is also complaining of an ingrown toenail to the tibiofibular border of the second digit of the left foot.  Objective: Vital signs are stable she is alert and oriented x3.  She has severe pain with limited range of motion of the first metatarsophalangeal joint of the left foot.  Hallux interphalangeal is also present.  Radiographs from previous evaluations were reviewed today demonstrating severe osteoarthritic change.  Sharp incurvated nail margins along the tibiofibular border of the second toe do demonstrate mild erythema no cellulitis drainage or odor.  No purulence at this time.  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  An ingrown toenail second digit left foot.  Plan: Discussed etiology pathology conservative versus surgical therapies.  At this point we discussed in great detail to today the surgery center and anesthesia group.  We also consented her for a Keller arthroplasty with a single silicone implant left foot as well as a partial permanent matrixectomy's to the tibiofibular border of the second toe.  She has questions regarding these procedures which I answered to the best my ability in layman's terms.  We did discuss the possible postop complications which may include but are not limited to postop pain bleeding swelling infection recurrence need further surgery overcorrection under correction etc. she understands this is amenable to it and I will follow-up with her in the near future.  We dispensed a Cam walker.  Also dispensed both oral and written home-going instructions for the night before surgery.  We also dispensed paperwork regarding the surgery center and anesthesia group.

## 2017-08-06 NOTE — Patient Instructions (Signed)
Pre-Operative Instructions  Congratulations, you have decided to take an important step towards improving your quality of life.  You can be assured that the doctors and staff at [facility] will be with you every step of the way.  Here are some important things you should know:  1. Plan to be at the surgery center/hospital at least 1 (one) hour prior to your scheduled time, unless otherwise directed by the surgical center/hospital staff.  You must have a responsible adult accompany you, remain during the surgery and drive you home.  Make sure you have directions to the surgical center/hospital to ensure you arrive on time. 2. If you are having surgery at [facility] or [facility], you will need a copy of your medical history and physical form from your family physician within one month prior to the date of surgery. We will give you a form for your primary physician to complete.  3. We make every effort to accommodate the date you request for surgery.  However, there are times where surgery dates or times have to be moved.  We will contact you as soon as possible if a change in schedule is required.   4. No aspirin/ibuprofen for one week before surgery.  If you are on aspirin, any non-steroidal anti-inflammatory medications (Mobic, Aleve, Ibuprofen) should not be taken seven (7) days prior to your surgery.  You make take Tylenol for pain prior to surgery.  5. Medications - If you are taking daily heart and blood pressure medications, seizure, reflux, allergy, asthma, anxiety, pain or diabetes medications, make sure you notify the surgery center/hospital before the day of surgery so they can tell you which medications you should take or avoid the day of surgery. 6. No food or drink after midnight the night before surgery unless directed otherwise by surgical center/hospital staff. 7. No alcoholic beverages 24-hours prior to surgery.  No smoking 24-hours prior or 24-hours after  surgery. 8. Wear loose pants or shorts. They should be loose enough to fit over bandages, boots, and casts. 9. Don't wear slip-on shoes. Sneakers are preferred. 10. Bring your boot with you to the surgery center/hospital.  Also bring crutches or a walker if your physician has prescribed it for you.  If you do not have this equipment, it will be provided for you after surgery. 11. If you have not been contacted by the surgery center/hospital by the day before your surgery, call to confirm the date and time of your surgery. 12. Leave-time from work may vary depending on the type of surgery you have.  Appropriate arrangements should be made prior to surgery with your employer. 13. Prescriptions will be provided immediately following surgery by your doctor.  Fill these as soon as possible after surgery and take the medication as directed. Pain medications will not be refilled on weekends and must be approved by the doctor. 14. Remove nail polish on the operative foot and avoid getting pedicures prior to surgery. 15. Wash the night before surgery.  The night before surgery wash the foot and leg well with water and the antibacterial soap provided. Be sure to pay special attention to beneath the toenails and in between the toes.  Wash for at least three (3) minutes. Rinse thoroughly with water and dry well with a towel.  Perform this wash unless told not to do so by your physician.  Enclosed: 1 Ice pack (please put in freezer the night before surgery)   1 Hibiclens skin cleaner     Pre-op instructions  If you have any questions regarding the instructions, please do not hesitate to call our office.  [place]: [address], [place], [place] [address] -- [phone]  [place]: [address] [place], [place] [address] -- [phone]  [place]: [address]  , [place] [address] -- [phone]  [place]: [address], [address], [place], [place] [address] -- [phone]  Website:  [email or web address] 

## 2017-08-07 ENCOUNTER — Telehealth: Payer: Self-pay | Admitting: Podiatry

## 2017-08-07 NOTE — Telephone Encounter (Signed)
This is [employer]. I'm calling to schedule surgery with Dr. Milinda Pointer. If you would please give me a call when you get a chance at [phone]. Thank you.

## 2017-08-07 NOTE — Telephone Encounter (Signed)
Called pt and apologized for just now returning her call. I told her I was helping fill in for [place] and that Dr. Milinda Pointer does his surgeries on Friday's. I told her his first available is Friday 15 February. Pt asked what time and I told her I could not give her an exact time that they call 24 to 48 hours in advance with that information. She stated that was a problem because her son/son-in law that is brining her needs to know when to take off work. I told her it would definitely be that Friday and probably sometime that morning. I explained to her that it depends on their schedules as other doctor's besides our doctor's do surgeries there and that they take patient's that are diabetic and/or children first. I told her the only other thing I could suggest was to call [place] and see if she can be put down to have her surgery early but that they would tell her the same thing as me as far as getting a specific time. Pt stated to go ahead and put her down for Friday 15 February. I told her to call with any other questions or concerns she might have.

## 2017-08-21 ENCOUNTER — Other Ambulatory Visit: Payer: Self-pay | Admitting: Podiatry

## 2017-08-21 MED ORDER — OXYCODONE-ACETAMINOPHEN 10-325 MG PO TABS: 1.0000 | ORAL_TABLET | ORAL | 0 refills | Status: DC | PRN

## 2017-08-21 MED ORDER — PROMETHAZINE HCL 25 MG PO TABS: 25.0000 mg | ORAL_TABLET | Freq: Three times a day (TID) | ORAL | 0 refills | Status: DC | PRN

## 2017-08-21 MED ORDER — CLINDAMYCIN HCL 150 MG PO CAPS: 150.0000 mg | ORAL_CAPSULE | Freq: Three times a day (TID) | ORAL | 0 refills | Status: DC

## 2017-08-22 ENCOUNTER — Encounter: Payer: Self-pay | Admitting: Podiatry

## 2017-08-22 DIAGNOSIS — M722 Plantar fascial fibromatosis: Secondary | ICD-10-CM | POA: Diagnosis not present

## 2017-08-22 DIAGNOSIS — M25572 Pain in left ankle and joints of left foot: Secondary | ICD-10-CM | POA: Diagnosis not present

## 2017-08-22 DIAGNOSIS — M2022 Hallux rigidus, left foot: Secondary | ICD-10-CM | POA: Diagnosis not present

## 2017-08-22 DIAGNOSIS — E7800 Pure hypercholesterolemia, unspecified: Secondary | ICD-10-CM | POA: Diagnosis not present

## 2017-08-22 DIAGNOSIS — L600 Ingrowing nail: Secondary | ICD-10-CM | POA: Diagnosis not present

## 2017-08-22 DIAGNOSIS — M2012 Hallux valgus (acquired), left foot: Secondary | ICD-10-CM | POA: Diagnosis not present

## 2017-08-22 DIAGNOSIS — M205X2 Other deformities of toe(s) (acquired), left foot: Secondary | ICD-10-CM | POA: Diagnosis not present

## 2017-08-25 ENCOUNTER — Telehealth: Payer: Self-pay | Admitting: *Deleted

## 2017-08-25 NOTE — Telephone Encounter (Signed)
POST OP CALL-    1) General condition stated by the patient: Pretty good  2) Is the pt having pain? Some  3) Pain score: 0  4) Has the pt taken Rx'd medication? No, not needed, only taken Ibuprofen  5) Is the pain medication giving relief? N/A  6) Any fever, chills, nausea, or vomiting? "I didn't feel too good yesterday, but it passed. I'm fine today"  7) Any shortness of breath or tightness in the calf? No  8) Is the bandages clean, dry and intact? Yes  9) Is the bandage excessively tight? Some tightness, but tolerable, pt has been elevating  10) Is there excessive bleeding or drainage coming through the bandage? No  11) Did you understand all of the post op instruction sheet given? Yes  12) Any questions or concerns regarding post op care/recovery? No    Confirmed POV appointment with patient

## 2017-08-27 ENCOUNTER — Ambulatory Visit (INDEPENDENT_AMBULATORY_CARE_PROVIDER_SITE_OTHER): Payer: Medicare Other

## 2017-08-27 ENCOUNTER — Encounter: Payer: Self-pay | Admitting: Podiatry

## 2017-08-27 ENCOUNTER — Ambulatory Visit (INDEPENDENT_AMBULATORY_CARE_PROVIDER_SITE_OTHER): Payer: Medicare Other | Admitting: Podiatry

## 2017-08-27 VITALS — BP 156/76 | HR 71 | Temp 98.0°F | Resp 16

## 2017-08-27 DIAGNOSIS — L600 Ingrowing nail: Secondary | ICD-10-CM

## 2017-08-27 DIAGNOSIS — M205X2 Other deformities of toe(s) (acquired), left foot: Secondary | ICD-10-CM

## 2017-08-27 NOTE — Progress Notes (Signed)
She presents today nearly 1 week status post Keller arthroplasty single silicone implant left foot and matrixectomy second toe left foot.  She states that she is doing quite well denies fever chills nausea vomiting muscle aches and pains.  Objective: Dry sterile dressing intact was removed demonstrates moderate erythema around the incision sites but no signs of cellulitis.  She has good range of motion of the first metatarsophalangeal joint.  Radiographs confirm nice placement of first metatarsal phalangeal joint replacement.  Assessment: Well-healing surgical foot left.  Plan: Redressed today dresser compressive dressing encouraged range of motion exercises encouraged her to continue use the Cam walker.  Follow-up with her in 1 week.

## 2017-09-03 ENCOUNTER — Ambulatory Visit (INDEPENDENT_AMBULATORY_CARE_PROVIDER_SITE_OTHER): Payer: Medicare Other | Admitting: Podiatry

## 2017-09-03 ENCOUNTER — Encounter: Payer: Self-pay | Admitting: Podiatry

## 2017-09-03 DIAGNOSIS — M205X2 Other deformities of toe(s) (acquired), left foot: Secondary | ICD-10-CM

## 2017-09-03 DIAGNOSIS — L600 Ingrowing nail: Secondary | ICD-10-CM

## 2017-09-03 NOTE — Progress Notes (Signed)
She presents today for follow-up of her Keller arthroplasty and matrixectomy left foot.  Date of surgery 08/22/2017.  She states that I think is doing pretty well.  She denies fever chills nausea vomiting muscle aches and pains.  She states that the pain is tolerable now.  Objective: Vital signs are stable she is alert and oriented x3 dry sterile dressing once removed demonstrates moderate edema some bleeding beneath the epidermis sutures are not ready to come out at this point.  There is no subclinical signs of infection.  It just appears that this is healing very slowly.  She has good range of motion without pain to the [facility] arthroplasty site.  Assessment: Well-healing surgical foot just slow correction of the skin.  Plan: Redressed today dresser compressive dressing follow-up with Dr. Amalia Hailey in 1 week just for a check and that she will be reappointed to me.

## 2017-09-09 ENCOUNTER — Ambulatory Visit (INDEPENDENT_AMBULATORY_CARE_PROVIDER_SITE_OTHER): Payer: Medicare Other | Admitting: Podiatry

## 2017-09-09 ENCOUNTER — Ambulatory Visit (INDEPENDENT_AMBULATORY_CARE_PROVIDER_SITE_OTHER): Payer: Medicare Other

## 2017-09-09 DIAGNOSIS — M205X2 Other deformities of toe(s) (acquired), left foot: Secondary | ICD-10-CM

## 2017-09-11 NOTE — Progress Notes (Signed)
   Subjective:  Patient presents today status post Keller arthroplasty and matricectomy of the left foot. DOS: 08/22/2017.  Patient states that she is doing much better.  She does have occasional pain which shoots to the foot which is within normal limits.  Patient has no other complaints at this time.  Past Medical History:  Diagnosis Date  . Diabetes mellitus without complication ([place])   . GERD (gastroesophageal reflux disease)   . Hyperlipidemia       Objective/Physical Exam Neurovascular status intact.  Skin incisions appear to be well coapted with sutures and staples intact. No sign of infectious process noted. No dehiscence. No active bleeding noted. Moderate edema noted to the surgical extremity.  Radiographic Exam:  Orthopedic hardware and osteotomies sites appear to be stable with routine healing.  Assessment: 1. s/p Keller arthroplasty and matricectomy in the left foot. DOS: 08/22/2017   Plan of Care:  1. Patient was evaluated. X-rays reviewed 2.  Today sutures were removed.  Dry sterile dressing was applied. 3.  Recommend that the patient keep the dressings clean dry and intact for an additional week. 4.  Continue weightbearing in the postoperative shoe 5.  Return to clinic in 1 week with Dr. Milinda Pointer   Edrick Kins, DPM [facility]  Dr. Edrick Kins, [address]                                        [place], [place] [address]                Office [phone]  Fax [phone]

## 2017-09-15 NOTE — Progress Notes (Signed)
Keller arthroplasty with silicone implant 1st MPJ left and removal ingrown toenail 2nd toe left, both borders of nail.

## 2017-09-17 ENCOUNTER — Ambulatory Visit (INDEPENDENT_AMBULATORY_CARE_PROVIDER_SITE_OTHER): Payer: Medicare Other | Admitting: Podiatry

## 2017-09-17 ENCOUNTER — Encounter: Payer: Self-pay | Admitting: Podiatry

## 2017-09-17 DIAGNOSIS — M205X9 Other deformities of toe(s) (acquired), unspecified foot: Secondary | ICD-10-CM

## 2017-09-17 NOTE — Progress Notes (Signed)
She presents today date of surgery August 22, 2017 [facility] bunion implant and excision nail second left.  She states that is doing good but that she seems to rub my foot.  Her graph objective: Vital signs are stable alert oriented x3 she has a very small dehiscence to the dorsal incision site.  She has great range of motion of the toe is pain-free.  I see no signs of infection.  Assessment: Well-healing surgical toe.  Plan: I recommended that she start daily dressings with a small amount of Betadine I am allow her to get back into regular shoe when comfortable.

## 2017-10-06 ENCOUNTER — Ambulatory Visit (INDEPENDENT_AMBULATORY_CARE_PROVIDER_SITE_OTHER): Payer: Medicare Other | Admitting: Podiatry

## 2017-10-06 ENCOUNTER — Ambulatory Visit (INDEPENDENT_AMBULATORY_CARE_PROVIDER_SITE_OTHER): Payer: Medicare Other

## 2017-10-06 ENCOUNTER — Encounter: Payer: Self-pay | Admitting: Podiatry

## 2017-10-06 DIAGNOSIS — M205X2 Other deformities of toe(s) (acquired), left foot: Secondary | ICD-10-CM

## 2017-10-06 NOTE — Progress Notes (Signed)
She presents today for follow-up of her Jake Michaelis bunion repair with a single silicone implant date of surgery August 22, 2017 states that they are all doing well including the matrixectomy sites she states that the toe swelled some last night but that the first time that has happened she used her compression anklet to get the swelling down and felt much better.  She states that otherwise been doing very well she is in her regular shoe gear.  Denies fever chills nausea vomiting muscle aches pains chest pain shortness of breath.  Objective: Vital signs are stable alert and oriented x3.  Pulses are palpable.  Neurologic sensorium is intact.  Deep tendon reflexes are intact.  Muscle strength was 5/5 bilateral.  Radiographs taken today demonstrate a nice Keller arthroplasty silicone implant without grommets first metatarsophalangeal joint of the left foot.  Appears to be intact and in good position.  Assessment: Well-healing Keller arthroplasty single silicone implant.  Plan: Continue current therapies and allow her to get back to her regular routine she will follow-up with me in 1 month if necessary.

## 2017-11-05 ENCOUNTER — Ambulatory Visit: Payer: Medicare Other | Admitting: Podiatry

## 2017-11-21 DIAGNOSIS — C44319 Basal cell carcinoma of skin of other parts of face: Secondary | ICD-10-CM | POA: Diagnosis not present

## 2017-11-21 DIAGNOSIS — L821 Other seborrheic keratosis: Secondary | ICD-10-CM | POA: Diagnosis not present

## 2017-11-21 DIAGNOSIS — Z1283 Encounter for screening for malignant neoplasm of skin: Secondary | ICD-10-CM | POA: Diagnosis not present

## 2017-11-24 ENCOUNTER — Telehealth: Payer: Self-pay | Admitting: *Deleted

## 2017-11-24 NOTE — Telephone Encounter (Signed)
Left message informing pt she should make an appt to be seen prior to her travels.

## 2017-11-24 NOTE — Telephone Encounter (Signed)
Pt states she is still having swelling in the 1st toe where Dr. Milinda Pointer performed a joint replacement, and she is going to fly from [facility] to [facility] 12/17/2017 and return 12/26/2017, and she wanted to know if she needed to see Dr. Milinda Pointer before the flight so she didn't have a problem when she got to [place].

## 2017-11-26 ENCOUNTER — Ambulatory Visit (INDEPENDENT_AMBULATORY_CARE_PROVIDER_SITE_OTHER): Payer: Medicare Other

## 2017-11-26 ENCOUNTER — Ambulatory Visit (INDEPENDENT_AMBULATORY_CARE_PROVIDER_SITE_OTHER): Payer: Medicare Other | Admitting: Podiatry

## 2017-11-26 ENCOUNTER — Encounter: Payer: Medicare Other | Admitting: Podiatry

## 2017-11-26 ENCOUNTER — Encounter: Payer: Self-pay | Admitting: Podiatry

## 2017-11-26 DIAGNOSIS — M205X2 Other deformities of toe(s) (acquired), left foot: Secondary | ICD-10-CM | POA: Diagnosis not present

## 2017-11-26 NOTE — Progress Notes (Signed)
She presents today status post Keller arthroplasty single silicone implant left foot.  Date of surgery August 22, 2017.  States that it feels really tender overlying the second and third metatarsals.  States that she is got a trip coming up to [place] and she would like to make sure that is doing better before then.  Objective: Vital signs stable alert and oriented x3.  Pulses are palpable.  Neurologic sensorium is intact she has soft tissue swelling and fluctuance overlying the second third metatarsal phalangeal joints it does not appear to be infected radiographs do not demonstrate any kind of fractures that are acute.  Assessment well-healing surgical foot prolonged edema left.  Plan: Provide her with new compression anklet's and recommended that she get back in her [place] shoe.  I want her to follow-up with physical therapy she gave me the name of her physical therapist and she would like to go to him in [place].  We will set that up for her.

## 2017-11-27 ENCOUNTER — Telehealth: Payer: Self-pay

## 2017-11-27 NOTE — Telephone Encounter (Signed)
Order/referral has been faxed to [facility] rehab.   Patient is aware of referral and will call to schedule her own appt

## 2017-11-27 NOTE — Telephone Encounter (Signed)
-----   Message from Rip Harbour, [facility] sent at 11/26/2017  9:57 AM EDT ----- Regarding: PT referrral  PT referral to [place]  S/p keller bunion with impant left  3 x week x 4 weeks

## 2017-11-27 NOTE — Addendum Note (Signed)
Addended by: Graceann Congress D on: 11/27/2017 08:48 AM   Modules accepted: Orders

## 2017-12-11 DIAGNOSIS — R531 Weakness: Secondary | ICD-10-CM | POA: Diagnosis not present

## 2017-12-11 DIAGNOSIS — R262 Difficulty in walking, not elsewhere classified: Secondary | ICD-10-CM | POA: Diagnosis not present

## 2017-12-11 DIAGNOSIS — M79672 Pain in left foot: Secondary | ICD-10-CM | POA: Diagnosis not present

## 2017-12-11 DIAGNOSIS — M21612 Bunion of left foot: Secondary | ICD-10-CM | POA: Diagnosis not present

## 2017-12-15 DIAGNOSIS — M25511 Pain in right shoulder: Secondary | ICD-10-CM | POA: Diagnosis not present

## 2017-12-15 DIAGNOSIS — M25611 Stiffness of right shoulder, not elsewhere classified: Secondary | ICD-10-CM | POA: Diagnosis not present

## 2017-12-15 DIAGNOSIS — R531 Weakness: Secondary | ICD-10-CM | POA: Diagnosis not present

## 2017-12-15 DIAGNOSIS — M75101 Unspecified rotator cuff tear or rupture of right shoulder, not specified as traumatic: Secondary | ICD-10-CM | POA: Diagnosis not present

## 2017-12-31 ENCOUNTER — Ambulatory Visit (INDEPENDENT_AMBULATORY_CARE_PROVIDER_SITE_OTHER): Payer: Medicare Other

## 2017-12-31 ENCOUNTER — Encounter: Payer: Self-pay | Admitting: Podiatry

## 2017-12-31 ENCOUNTER — Ambulatory Visit (INDEPENDENT_AMBULATORY_CARE_PROVIDER_SITE_OTHER): Payer: Medicare Other | Admitting: Podiatry

## 2017-12-31 DIAGNOSIS — Z9889 Other specified postprocedural states: Secondary | ICD-10-CM

## 2017-12-31 DIAGNOSIS — M205X2 Other deformities of toe(s) (acquired), left foot: Secondary | ICD-10-CM | POA: Diagnosis not present

## 2017-12-31 DIAGNOSIS — L600 Ingrowing nail: Secondary | ICD-10-CM

## 2017-12-31 NOTE — Progress Notes (Signed)
She presents today for follow-up of her Jake Michaelis bunionectomy she states that she is doing very well with it.  She has only attended physical therapy twice before her trip to [place].  Objective: Vital signs are stable she is alert and oriented x3.  There is no erythema edema cellulitis drainage or odor she has good range of motion.  Radiographs confirm well-placed osteotomy with Jake Michaelis arthroplasty.  Assessment: Well-healing Keller arthroplasty.  Plan: Follow-up with me on an as-needed basis.  I expect she will continue physical therapy for at least another 4 weeks follow-up with me after that if she deems it necessary

## 2018-01-01 DIAGNOSIS — R262 Difficulty in walking, not elsewhere classified: Secondary | ICD-10-CM | POA: Diagnosis not present

## 2018-01-01 DIAGNOSIS — R531 Weakness: Secondary | ICD-10-CM | POA: Diagnosis not present

## 2018-01-01 DIAGNOSIS — M79672 Pain in left foot: Secondary | ICD-10-CM | POA: Diagnosis not present

## 2018-01-01 DIAGNOSIS — M21612 Bunion of left foot: Secondary | ICD-10-CM | POA: Diagnosis not present

## 2018-01-02 DIAGNOSIS — Z85828 Personal history of other malignant neoplasm of skin: Secondary | ICD-10-CM | POA: Diagnosis not present

## 2018-01-02 DIAGNOSIS — Z08 Encounter for follow-up examination after completed treatment for malignant neoplasm: Secondary | ICD-10-CM | POA: Diagnosis not present

## 2018-01-14 DIAGNOSIS — R262 Difficulty in walking, not elsewhere classified: Secondary | ICD-10-CM | POA: Diagnosis not present

## 2018-01-14 DIAGNOSIS — R531 Weakness: Secondary | ICD-10-CM | POA: Diagnosis not present

## 2018-01-14 DIAGNOSIS — M79672 Pain in left foot: Secondary | ICD-10-CM | POA: Diagnosis not present

## 2018-01-14 DIAGNOSIS — M21612 Bunion of left foot: Secondary | ICD-10-CM | POA: Diagnosis not present

## 2018-01-20 DIAGNOSIS — Z0000 Encounter for general adult medical examination without abnormal findings: Secondary | ICD-10-CM | POA: Diagnosis not present

## 2018-01-20 DIAGNOSIS — G4733 Obstructive sleep apnea (adult) (pediatric): Secondary | ICD-10-CM | POA: Diagnosis not present

## 2018-01-20 DIAGNOSIS — E782 Mixed hyperlipidemia: Secondary | ICD-10-CM | POA: Diagnosis not present

## 2018-01-20 DIAGNOSIS — E1165 Type 2 diabetes mellitus with hyperglycemia: Secondary | ICD-10-CM | POA: Diagnosis not present

## 2018-01-28 DIAGNOSIS — E1165 Type 2 diabetes mellitus with hyperglycemia: Secondary | ICD-10-CM | POA: Diagnosis not present

## 2018-01-28 DIAGNOSIS — G4733 Obstructive sleep apnea (adult) (pediatric): Secondary | ICD-10-CM | POA: Diagnosis not present

## 2018-01-28 DIAGNOSIS — E782 Mixed hyperlipidemia: Secondary | ICD-10-CM | POA: Diagnosis not present

## 2018-02-02 ENCOUNTER — Ambulatory Visit (INDEPENDENT_AMBULATORY_CARE_PROVIDER_SITE_OTHER): Payer: Medicare Other | Admitting: Podiatry

## 2018-02-02 ENCOUNTER — Encounter: Payer: Self-pay | Admitting: Podiatry

## 2018-02-02 DIAGNOSIS — M795 Residual foreign body in soft tissue: Secondary | ICD-10-CM

## 2018-02-02 NOTE — Progress Notes (Signed)
She presents today states that I think of splint or my heel she refers to her left heel.  She states that she was sitting on the floor slid backwards on her hardwood floor think she got a splinter in her heel that way.  States that she cannot get it out.  Objective: Vital signs are stable alert and oriented x3.  There is no erythema edema cellulitis drainage of the exception of a small open wound to the lateral aspect of the left heel which does demonstrate some mild erythema surrounding it no purulence no malodor.  Upon evaluation and palpation of the area easily able to palpate a piece of foreign body within the in the small open wound.  At this point is exquisitely tender to trim the tissue.  I placed Xylocaine cream under occlusion for a few minutes just to help with the tenderness.  I was then able to open the wound a little larger and remove a large piece of splinter proximal only 2 cm in length and about 3 mm in width.  The area was copiously lavaged with normal sterile saline and Betadine.  Dressed a compressive dressing was applied to leave this on until tomorrow then start water washing the foot Epsom salts and warm water follow-up with me should she have any symptoms.  Assessment retention foreign body left.  Plan: Removal foreign body.

## 2018-02-03 ENCOUNTER — Ambulatory Visit: Payer: Medicare Other | Admitting: Podiatry

## 2018-02-04 DIAGNOSIS — M21612 Bunion of left foot: Secondary | ICD-10-CM | POA: Diagnosis not present

## 2018-02-04 DIAGNOSIS — R531 Weakness: Secondary | ICD-10-CM | POA: Diagnosis not present

## 2018-02-04 DIAGNOSIS — M79672 Pain in left foot: Secondary | ICD-10-CM | POA: Diagnosis not present

## 2018-02-04 DIAGNOSIS — R262 Difficulty in walking, not elsewhere classified: Secondary | ICD-10-CM | POA: Diagnosis not present

## 2018-02-05 NOTE — Progress Notes (Signed)
This encounter was created in error - please disregard.

## 2018-02-23 ENCOUNTER — Other Ambulatory Visit: Payer: Self-pay | Admitting: Obstetrics and Gynecology

## 2018-02-23 DIAGNOSIS — Z1231 Encounter for screening mammogram for malignant neoplasm of breast: Secondary | ICD-10-CM

## 2018-04-08 ENCOUNTER — Ambulatory Visit
Admission: RE | Admit: 2018-04-08 | Discharge: 2018-04-08 | Disposition: A | Discharge status: Home / Self Care | Payer: Medicare Other | Source: Ambulatory Visit | Attending: Obstetrics and Gynecology | Admitting: Obstetrics and Gynecology

## 2018-04-08 DIAGNOSIS — Z1231 Encounter for screening mammogram for malignant neoplasm of breast: Secondary | ICD-10-CM

## 2018-05-20 DIAGNOSIS — Z08 Encounter for follow-up examination after completed treatment for malignant neoplasm: Secondary | ICD-10-CM | POA: Diagnosis not present

## 2018-05-20 DIAGNOSIS — L570 Actinic keratosis: Secondary | ICD-10-CM | POA: Diagnosis not present

## 2018-05-20 DIAGNOSIS — Z85828 Personal history of other malignant neoplasm of skin: Secondary | ICD-10-CM | POA: Diagnosis not present

## 2018-05-20 DIAGNOSIS — D0439 Carcinoma in situ of skin of other parts of face: Secondary | ICD-10-CM | POA: Diagnosis not present

## 2018-05-20 DIAGNOSIS — X32XXXD Exposure to sunlight, subsequent encounter: Secondary | ICD-10-CM | POA: Diagnosis not present

## 2018-05-22 DIAGNOSIS — K649 Unspecified hemorrhoids: Secondary | ICD-10-CM | POA: Diagnosis not present

## 2018-05-22 DIAGNOSIS — N816 Rectocele: Secondary | ICD-10-CM | POA: Diagnosis not present

## 2018-05-22 DIAGNOSIS — Z01419 Encounter for gynecological examination (general) (routine) without abnormal findings: Secondary | ICD-10-CM | POA: Diagnosis not present

## 2018-07-21 DIAGNOSIS — Z86010 Personal history of colonic polyps: Secondary | ICD-10-CM | POA: Diagnosis not present

## 2018-07-21 DIAGNOSIS — K5730 Diverticulosis of large intestine without perforation or abscess without bleeding: Secondary | ICD-10-CM | POA: Diagnosis not present

## 2018-07-21 DIAGNOSIS — K649 Unspecified hemorrhoids: Secondary | ICD-10-CM | POA: Diagnosis not present

## 2018-07-22 DIAGNOSIS — N816 Rectocele: Secondary | ICD-10-CM | POA: Diagnosis not present

## 2018-07-29 DIAGNOSIS — E1165 Type 2 diabetes mellitus with hyperglycemia: Secondary | ICD-10-CM | POA: Diagnosis not present

## 2018-07-29 DIAGNOSIS — G4733 Obstructive sleep apnea (adult) (pediatric): Secondary | ICD-10-CM | POA: Diagnosis not present

## 2018-07-29 DIAGNOSIS — E782 Mixed hyperlipidemia: Secondary | ICD-10-CM | POA: Diagnosis not present

## 2018-07-31 DIAGNOSIS — Z08 Encounter for follow-up examination after completed treatment for malignant neoplasm: Secondary | ICD-10-CM | POA: Diagnosis not present

## 2018-07-31 DIAGNOSIS — L570 Actinic keratosis: Secondary | ICD-10-CM | POA: Diagnosis not present

## 2018-07-31 DIAGNOSIS — L821 Other seborrheic keratosis: Secondary | ICD-10-CM | POA: Diagnosis not present

## 2018-07-31 DIAGNOSIS — Z85828 Personal history of other malignant neoplasm of skin: Secondary | ICD-10-CM | POA: Diagnosis not present

## 2018-07-31 DIAGNOSIS — X32XXXD Exposure to sunlight, subsequent encounter: Secondary | ICD-10-CM | POA: Diagnosis not present

## 2018-08-05 DIAGNOSIS — E782 Mixed hyperlipidemia: Secondary | ICD-10-CM | POA: Diagnosis not present

## 2018-08-05 DIAGNOSIS — E1165 Type 2 diabetes mellitus with hyperglycemia: Secondary | ICD-10-CM | POA: Diagnosis not present

## 2018-08-05 DIAGNOSIS — G4733 Obstructive sleep apnea (adult) (pediatric): Secondary | ICD-10-CM | POA: Diagnosis not present

## 2018-11-11 DIAGNOSIS — Z7189 Other specified counseling: Secondary | ICD-10-CM | POA: Diagnosis not present

## 2018-11-11 DIAGNOSIS — L0390 Cellulitis, unspecified: Secondary | ICD-10-CM | POA: Diagnosis not present

## 2018-11-23 DIAGNOSIS — L0390 Cellulitis, unspecified: Secondary | ICD-10-CM | POA: Diagnosis not present

## 2018-11-23 DIAGNOSIS — Z7189 Other specified counseling: Secondary | ICD-10-CM | POA: Diagnosis not present

## 2018-11-23 DIAGNOSIS — L723 Sebaceous cyst: Secondary | ICD-10-CM | POA: Diagnosis not present

## 2018-11-26 DIAGNOSIS — L723 Sebaceous cyst: Secondary | ICD-10-CM | POA: Diagnosis not present

## 2018-11-26 DIAGNOSIS — L089 Local infection of the skin and subcutaneous tissue, unspecified: Secondary | ICD-10-CM | POA: Diagnosis not present

## 2018-12-30 DIAGNOSIS — L089 Local infection of the skin and subcutaneous tissue, unspecified: Secondary | ICD-10-CM | POA: Diagnosis not present

## 2018-12-30 DIAGNOSIS — L723 Sebaceous cyst: Secondary | ICD-10-CM | POA: Diagnosis not present

## 2019-02-24 DIAGNOSIS — Z0000 Encounter for general adult medical examination without abnormal findings: Secondary | ICD-10-CM | POA: Diagnosis not present

## 2019-02-24 DIAGNOSIS — Z7189 Other specified counseling: Secondary | ICD-10-CM | POA: Diagnosis not present

## 2019-02-24 DIAGNOSIS — E1165 Type 2 diabetes mellitus with hyperglycemia: Secondary | ICD-10-CM | POA: Diagnosis not present

## 2019-02-24 DIAGNOSIS — G4733 Obstructive sleep apnea (adult) (pediatric): Secondary | ICD-10-CM | POA: Diagnosis not present

## 2019-02-24 DIAGNOSIS — Z23 Encounter for immunization: Secondary | ICD-10-CM | POA: Diagnosis not present

## 2019-02-24 DIAGNOSIS — E782 Mixed hyperlipidemia: Secondary | ICD-10-CM | POA: Diagnosis not present

## 2019-03-02 ENCOUNTER — Other Ambulatory Visit: Payer: Self-pay | Admitting: Obstetrics and Gynecology

## 2019-03-02 DIAGNOSIS — Z1231 Encounter for screening mammogram for malignant neoplasm of breast: Secondary | ICD-10-CM

## 2019-03-03 DIAGNOSIS — R1013 Epigastric pain: Secondary | ICD-10-CM | POA: Diagnosis not present

## 2019-03-03 DIAGNOSIS — Z7189 Other specified counseling: Secondary | ICD-10-CM | POA: Diagnosis not present

## 2019-03-03 DIAGNOSIS — E782 Mixed hyperlipidemia: Secondary | ICD-10-CM | POA: Diagnosis not present

## 2019-03-03 DIAGNOSIS — K210 Gastro-esophageal reflux disease with esophagitis: Secondary | ICD-10-CM | POA: Diagnosis not present

## 2019-03-03 DIAGNOSIS — G4733 Obstructive sleep apnea (adult) (pediatric): Secondary | ICD-10-CM | POA: Diagnosis not present

## 2019-03-03 DIAGNOSIS — E1165 Type 2 diabetes mellitus with hyperglycemia: Secondary | ICD-10-CM | POA: Diagnosis not present

## 2019-03-03 DIAGNOSIS — R030 Elevated blood-pressure reading, without diagnosis of hypertension: Secondary | ICD-10-CM | POA: Diagnosis not present

## 2019-03-03 DIAGNOSIS — R002 Palpitations: Secondary | ICD-10-CM | POA: Diagnosis not present

## 2019-03-10 DIAGNOSIS — K760 Fatty (change of) liver, not elsewhere classified: Secondary | ICD-10-CM | POA: Diagnosis not present

## 2019-03-10 DIAGNOSIS — R1013 Epigastric pain: Secondary | ICD-10-CM | POA: Diagnosis not present

## 2019-03-24 DIAGNOSIS — K760 Fatty (change of) liver, not elsewhere classified: Secondary | ICD-10-CM | POA: Diagnosis not present

## 2019-03-24 DIAGNOSIS — E1165 Type 2 diabetes mellitus with hyperglycemia: Secondary | ICD-10-CM | POA: Diagnosis not present

## 2019-03-24 DIAGNOSIS — E782 Mixed hyperlipidemia: Secondary | ICD-10-CM | POA: Diagnosis not present

## 2019-03-24 DIAGNOSIS — R1013 Epigastric pain: Secondary | ICD-10-CM | POA: Diagnosis not present

## 2019-03-24 DIAGNOSIS — Z7189 Other specified counseling: Secondary | ICD-10-CM | POA: Diagnosis not present

## 2019-04-12 DIAGNOSIS — H2513 Age-related nuclear cataract, bilateral: Secondary | ICD-10-CM | POA: Diagnosis not present

## 2019-04-12 DIAGNOSIS — H40023 Open angle with borderline findings, high risk, bilateral: Secondary | ICD-10-CM | POA: Diagnosis not present

## 2019-04-12 DIAGNOSIS — E119 Type 2 diabetes mellitus without complications: Secondary | ICD-10-CM | POA: Diagnosis not present

## 2019-04-12 DIAGNOSIS — H524 Presbyopia: Secondary | ICD-10-CM | POA: Diagnosis not present

## 2019-04-15 ENCOUNTER — Ambulatory Visit
Admission: RE | Admit: 2019-04-15 | Discharge: 2019-04-15 | Disposition: A | Discharge status: Home / Self Care | Payer: Medicare Other | Source: Ambulatory Visit | Attending: Obstetrics and Gynecology | Admitting: Obstetrics and Gynecology

## 2019-04-15 ENCOUNTER — Other Ambulatory Visit: Payer: Self-pay

## 2019-04-15 DIAGNOSIS — Z1231 Encounter for screening mammogram for malignant neoplasm of breast: Secondary | ICD-10-CM | POA: Diagnosis not present

## 2019-05-25 DIAGNOSIS — E1165 Type 2 diabetes mellitus with hyperglycemia: Secondary | ICD-10-CM | POA: Diagnosis not present

## 2019-05-25 DIAGNOSIS — E782 Mixed hyperlipidemia: Secondary | ICD-10-CM | POA: Diagnosis not present

## 2019-06-01 DIAGNOSIS — K2100 Gastro-esophageal reflux disease with esophagitis, without bleeding: Secondary | ICD-10-CM | POA: Diagnosis not present

## 2019-06-01 DIAGNOSIS — E782 Mixed hyperlipidemia: Secondary | ICD-10-CM | POA: Diagnosis not present

## 2019-06-01 DIAGNOSIS — E1165 Type 2 diabetes mellitus with hyperglycemia: Secondary | ICD-10-CM | POA: Diagnosis not present

## 2019-06-01 DIAGNOSIS — K760 Fatty (change of) liver, not elsewhere classified: Secondary | ICD-10-CM | POA: Diagnosis not present

## 2019-06-14 DIAGNOSIS — M7061 Trochanteric bursitis, right hip: Secondary | ICD-10-CM | POA: Diagnosis not present

## 2019-06-14 DIAGNOSIS — Z96641 Presence of right artificial hip joint: Secondary | ICD-10-CM | POA: Diagnosis not present

## 2019-06-17 DIAGNOSIS — Z23 Encounter for immunization: Secondary | ICD-10-CM | POA: Diagnosis not present

## 2019-07-20 DIAGNOSIS — R269 Unspecified abnormalities of gait and mobility: Secondary | ICD-10-CM | POA: Diagnosis not present

## 2019-07-20 DIAGNOSIS — M25551 Pain in right hip: Secondary | ICD-10-CM | POA: Diagnosis not present

## 2019-07-22 DIAGNOSIS — M25551 Pain in right hip: Secondary | ICD-10-CM | POA: Diagnosis not present

## 2019-07-22 DIAGNOSIS — R269 Unspecified abnormalities of gait and mobility: Secondary | ICD-10-CM | POA: Diagnosis not present

## 2019-07-27 DIAGNOSIS — R269 Unspecified abnormalities of gait and mobility: Secondary | ICD-10-CM | POA: Diagnosis not present

## 2019-07-27 DIAGNOSIS — M25551 Pain in right hip: Secondary | ICD-10-CM | POA: Diagnosis not present

## 2019-07-28 ENCOUNTER — Ambulatory Visit: Payer: Medicare Other | Attending: Internal Medicine

## 2019-07-28 DIAGNOSIS — Z23 Encounter for immunization: Secondary | ICD-10-CM | POA: Insufficient documentation

## 2019-07-28 NOTE — Progress Notes (Signed)
   Covid-19 Vaccination Clinic  Name:  Teresa Marquez    MRN: [id number] DOB: 09/10/1940  07/28/2019  Ms. Berkey was observed post Covid-19 immunization for 15 minutes without incidence. She was provided with Vaccine Information Sheet and instruction to access the V-Safe system.   Ms. Kunsman was instructed to call 911 with any severe reactions post vaccine: Marland Kitchen Difficulty breathing  . Swelling of your face and throat  . A fast heartbeat  . A bad rash all over your body  . Dizziness and weakness    Immunizations Administered    Name Date Dose VIS Date Route   Pfizer COVID-19 Vaccine 07/28/2019 11:17 AM 0.3 mL 06/18/2019 Intramuscular   Manufacturer: [place]   Lot: [id number]   [place]: [id number]

## 2019-07-29 DIAGNOSIS — M25551 Pain in right hip: Secondary | ICD-10-CM | POA: Diagnosis not present

## 2019-07-29 DIAGNOSIS — R269 Unspecified abnormalities of gait and mobility: Secondary | ICD-10-CM | POA: Diagnosis not present

## 2019-08-03 DIAGNOSIS — R269 Unspecified abnormalities of gait and mobility: Secondary | ICD-10-CM | POA: Diagnosis not present

## 2019-08-03 DIAGNOSIS — M25551 Pain in right hip: Secondary | ICD-10-CM | POA: Diagnosis not present

## 2019-08-05 DIAGNOSIS — R269 Unspecified abnormalities of gait and mobility: Secondary | ICD-10-CM | POA: Diagnosis not present

## 2019-08-05 DIAGNOSIS — M25551 Pain in right hip: Secondary | ICD-10-CM | POA: Diagnosis not present

## 2019-08-13 DIAGNOSIS — M25551 Pain in right hip: Secondary | ICD-10-CM | POA: Diagnosis not present

## 2019-08-13 DIAGNOSIS — R269 Unspecified abnormalities of gait and mobility: Secondary | ICD-10-CM | POA: Diagnosis not present

## 2019-08-18 ENCOUNTER — Ambulatory Visit: Payer: Medicare Other | Attending: Internal Medicine

## 2019-08-18 DIAGNOSIS — Z23 Encounter for immunization: Secondary | ICD-10-CM

## 2019-08-18 NOTE — Progress Notes (Signed)
   Covid-19 Vaccination Clinic  Name:  Teresa Marquez    MRN: [id number] DOB: 12-16-1940  08/18/2019  [name] was observed post Covid-19 immunization for 15 minutes without incidence. She was provided with Vaccine Information Sheet and instruction to access the V-Safe system.   [name] was instructed to call 911 with any severe reactions post vaccine: Marland Kitchen Difficulty breathing  . Swelling of your face and throat  . A fast heartbeat  . A bad rash all over your body  . Dizziness and weakness    Immunizations Administered    Name Date Dose VIS Date Route   Pfizer COVID-19 Vaccine 08/18/2019  3:23 PM 0.3 mL 06/18/2019 Intramuscular   Manufacturer: [place]   Lot: [id number]   [place]: [id number]

## 2019-08-31 DIAGNOSIS — M25551 Pain in right hip: Secondary | ICD-10-CM | POA: Diagnosis not present

## 2019-08-31 DIAGNOSIS — R269 Unspecified abnormalities of gait and mobility: Secondary | ICD-10-CM | POA: Diagnosis not present

## 2019-09-28 DIAGNOSIS — E782 Mixed hyperlipidemia: Secondary | ICD-10-CM | POA: Diagnosis not present

## 2019-09-28 DIAGNOSIS — K2100 Gastro-esophageal reflux disease with esophagitis, without bleeding: Secondary | ICD-10-CM | POA: Diagnosis not present

## 2019-09-28 DIAGNOSIS — E1165 Type 2 diabetes mellitus with hyperglycemia: Secondary | ICD-10-CM | POA: Diagnosis not present

## 2019-09-28 DIAGNOSIS — K760 Fatty (change of) liver, not elsewhere classified: Secondary | ICD-10-CM | POA: Diagnosis not present

## 2019-10-05 DIAGNOSIS — H9312 Tinnitus, left ear: Secondary | ICD-10-CM | POA: Diagnosis not present

## 2019-10-05 DIAGNOSIS — G4733 Obstructive sleep apnea (adult) (pediatric): Secondary | ICD-10-CM | POA: Diagnosis not present

## 2019-10-05 DIAGNOSIS — E782 Mixed hyperlipidemia: Secondary | ICD-10-CM | POA: Diagnosis not present

## 2019-10-05 DIAGNOSIS — E1165 Type 2 diabetes mellitus with hyperglycemia: Secondary | ICD-10-CM | POA: Diagnosis not present

## 2019-10-05 DIAGNOSIS — M79673 Pain in unspecified foot: Secondary | ICD-10-CM | POA: Diagnosis not present

## 2019-10-05 DIAGNOSIS — R030 Elevated blood-pressure reading, without diagnosis of hypertension: Secondary | ICD-10-CM | POA: Diagnosis not present

## 2019-10-05 DIAGNOSIS — K760 Fatty (change of) liver, not elsewhere classified: Secondary | ICD-10-CM | POA: Diagnosis not present

## 2019-10-12 DIAGNOSIS — H40023 Open angle with borderline findings, high risk, bilateral: Secondary | ICD-10-CM | POA: Diagnosis not present

## 2019-11-05 DIAGNOSIS — R3989 Other symptoms and signs involving the genitourinary system: Secondary | ICD-10-CM | POA: Diagnosis not present

## 2019-11-08 IMAGING — MG MM DIGITAL SCREENING BILAT W/ TOMO W/ CAD
8 series · 8 of 24 positions shown · non-contrast
Comparison: Previous exam(s).

CLINICAL DATA: Screening.

EXAM:
DIGITAL SCREENING BILATERAL MAMMOGRAM WITH TOMO AND CAD

[R MLO synth-2D]
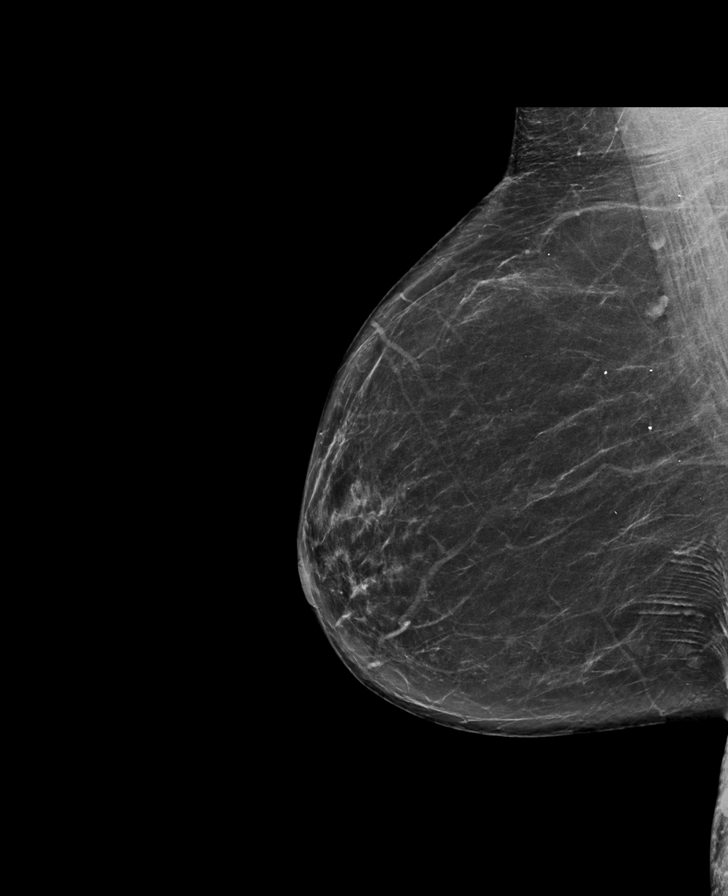

[L MLO synth-2D]
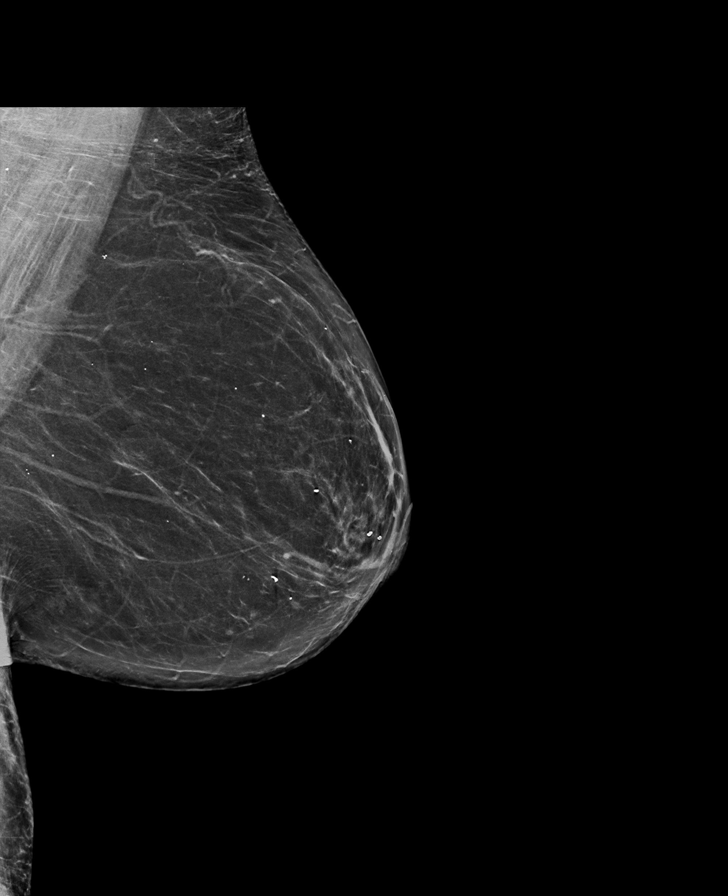

[R CC synth-2D]
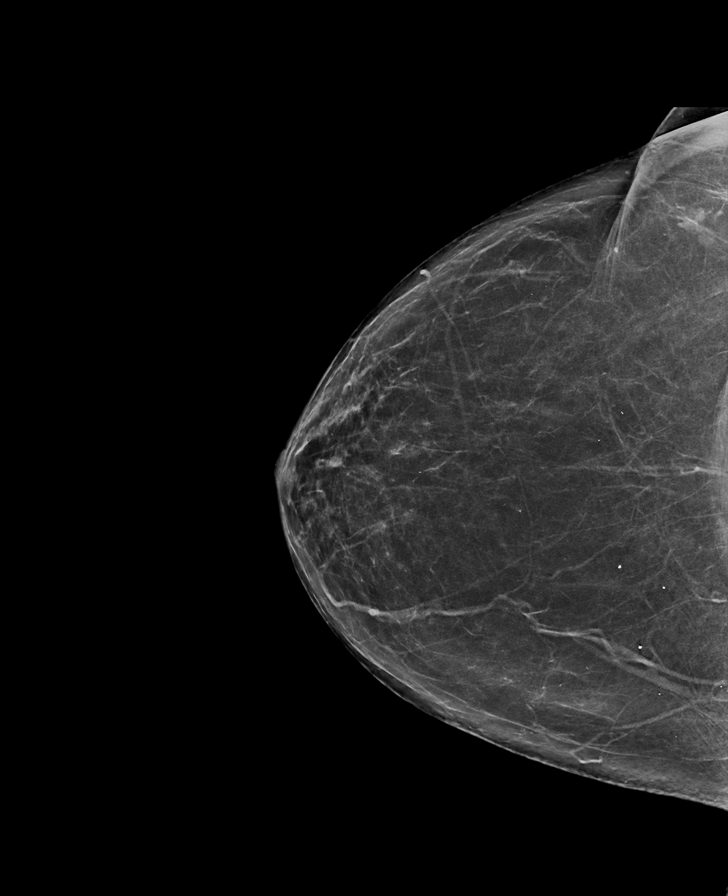

[L CC synth-2D]
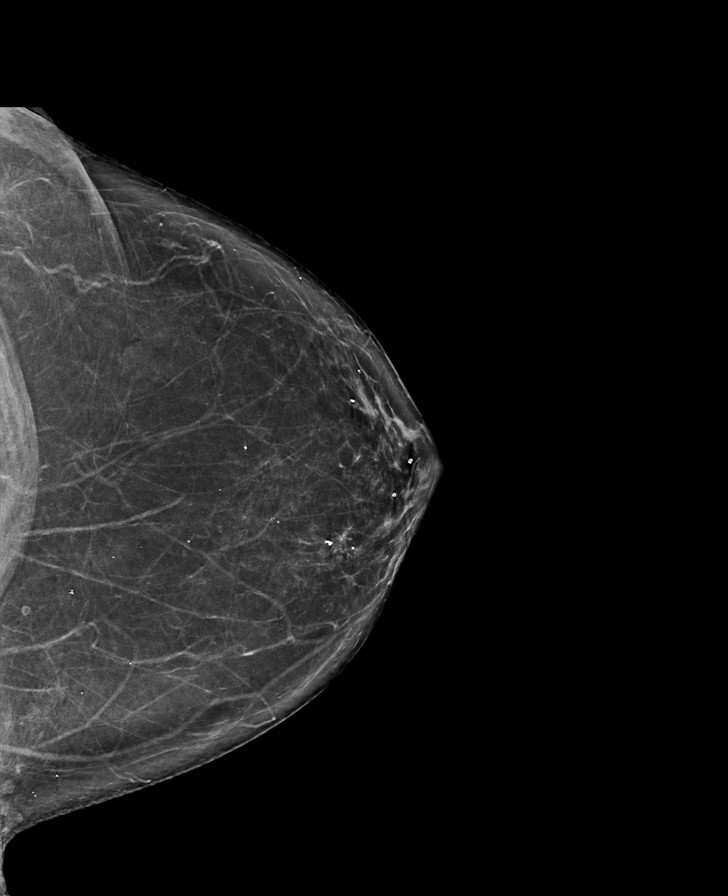

[L MLO tomo · tomo slice 40/79.0]
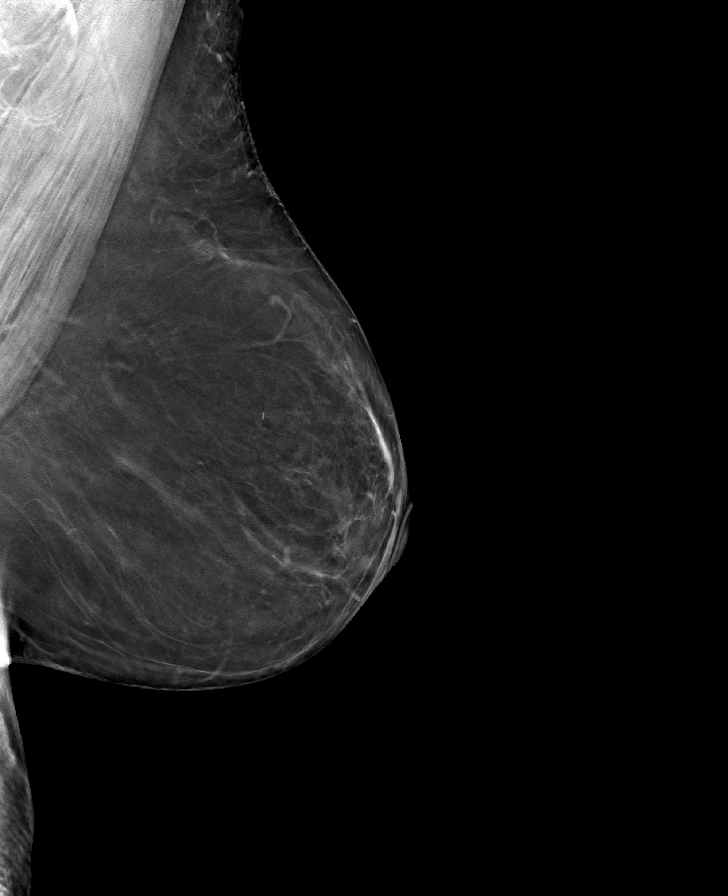

[R CC tomo · tomo slice 35/70.0]
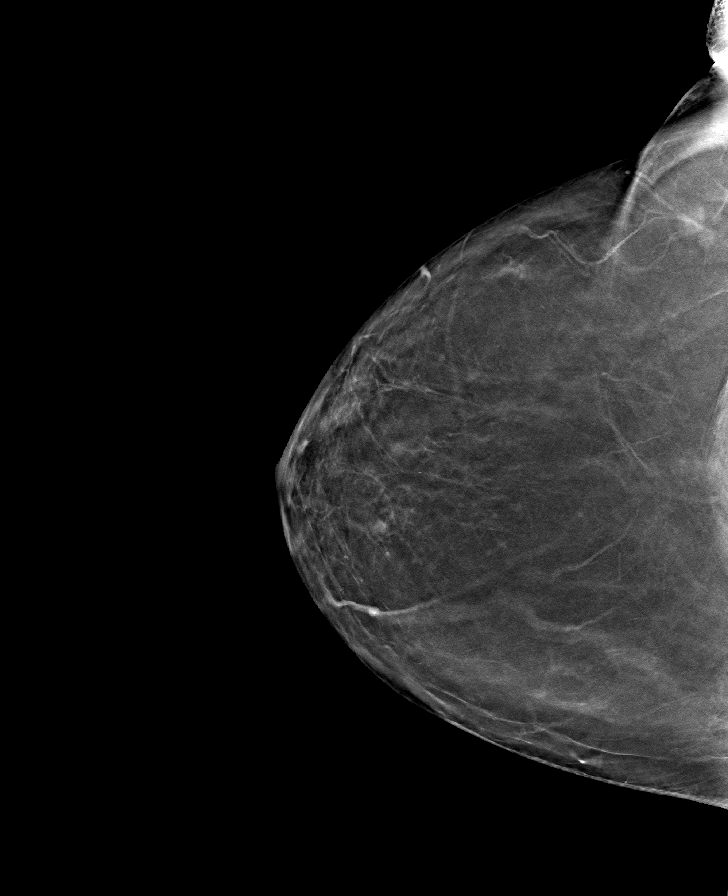

[L CC tomo · tomo slice 35/69.0]
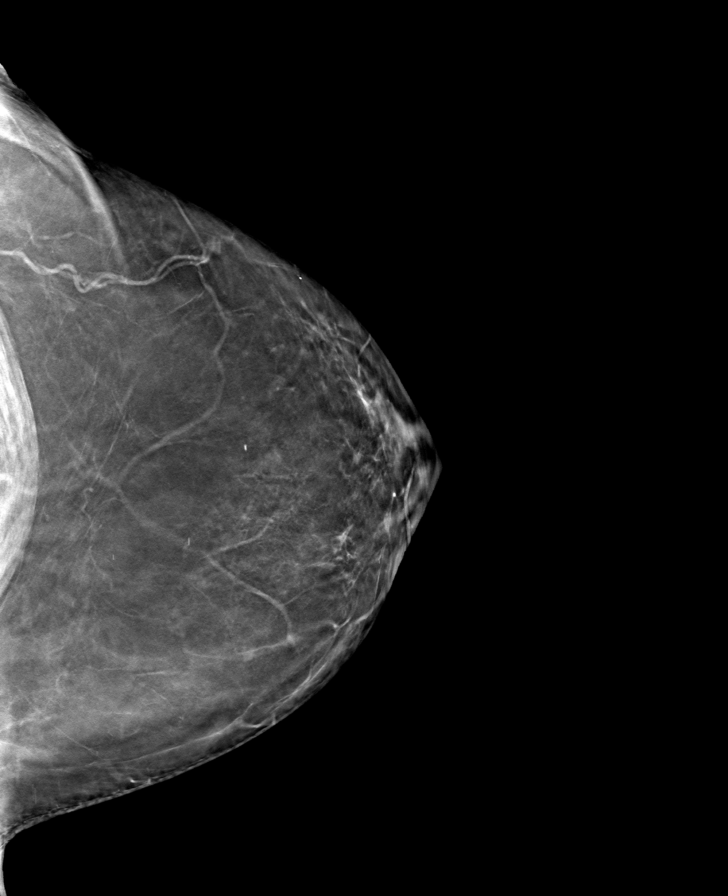

[R MLO tomo · tomo slice 39/76.0]
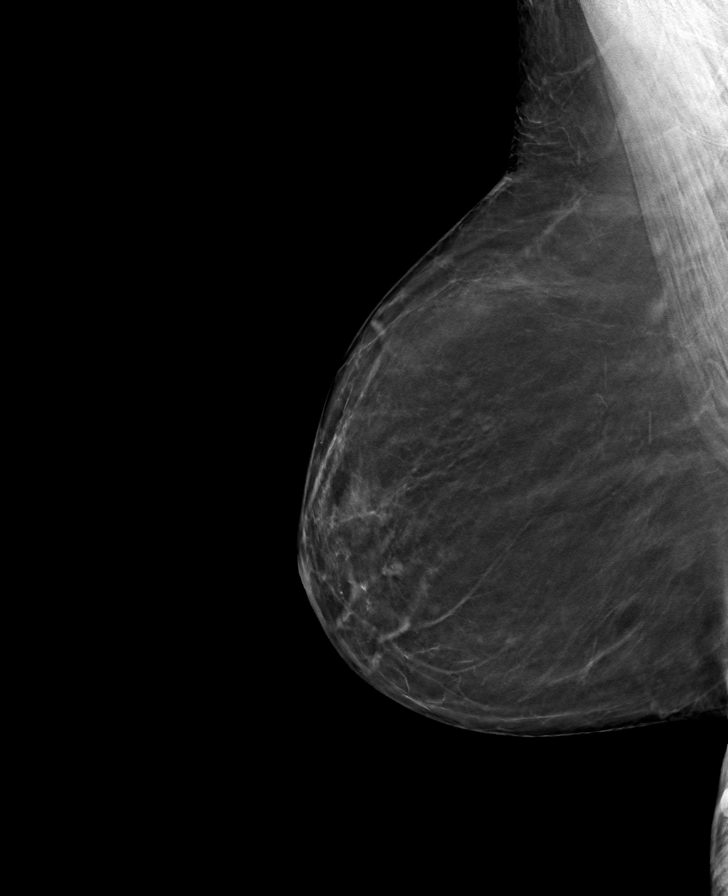

[8 of 24 positions shown; findings below may reference images not displayed]

ACR Breast Density Category b: There are scattered areas of
fibroglandular density.
FINDINGS: There are no findings suspicious for malignancy. Images were
processed with CAD.
IMPRESSION: No mammographic evidence of malignancy. A result letter of this
screening mammogram will be mailed directly to the patient.

RECOMMENDATION:
Screening mammogram in one year. (Code:[id number])

BI-RADS CATEGORY  1: Negative.

## 2019-11-26 DIAGNOSIS — Z1283 Encounter for screening for malignant neoplasm of skin: Secondary | ICD-10-CM | POA: Diagnosis not present

## 2019-11-26 DIAGNOSIS — L820 Inflamed seborrheic keratosis: Secondary | ICD-10-CM | POA: Diagnosis not present

## 2019-11-26 DIAGNOSIS — D225 Melanocytic nevi of trunk: Secondary | ICD-10-CM | POA: Diagnosis not present

## 2019-11-26 DIAGNOSIS — L720 Epidermal cyst: Secondary | ICD-10-CM | POA: Diagnosis not present

## 2019-12-17 DIAGNOSIS — H9311 Tinnitus, right ear: Secondary | ICD-10-CM | POA: Diagnosis not present

## 2019-12-17 DIAGNOSIS — G4733 Obstructive sleep apnea (adult) (pediatric): Secondary | ICD-10-CM | POA: Diagnosis not present

## 2019-12-17 DIAGNOSIS — J310 Chronic rhinitis: Secondary | ICD-10-CM | POA: Diagnosis not present

## 2019-12-17 DIAGNOSIS — H903 Sensorineural hearing loss, bilateral: Secondary | ICD-10-CM | POA: Diagnosis not present

## 2019-12-17 DIAGNOSIS — K219 Gastro-esophageal reflux disease without esophagitis: Secondary | ICD-10-CM | POA: Diagnosis not present

## 2020-02-02 DIAGNOSIS — L720 Epidermal cyst: Secondary | ICD-10-CM | POA: Diagnosis not present

## 2020-02-28 DIAGNOSIS — E782 Mixed hyperlipidemia: Secondary | ICD-10-CM | POA: Diagnosis not present

## 2020-02-28 DIAGNOSIS — Z0000 Encounter for general adult medical examination without abnormal findings: Secondary | ICD-10-CM | POA: Diagnosis not present

## 2020-02-28 DIAGNOSIS — Z79899 Other long term (current) drug therapy: Secondary | ICD-10-CM | POA: Diagnosis not present

## 2020-02-28 DIAGNOSIS — E1165 Type 2 diabetes mellitus with hyperglycemia: Secondary | ICD-10-CM | POA: Diagnosis not present

## 2020-02-28 DIAGNOSIS — K760 Fatty (change of) liver, not elsewhere classified: Secondary | ICD-10-CM | POA: Diagnosis not present

## 2020-03-06 DIAGNOSIS — R030 Elevated blood-pressure reading, without diagnosis of hypertension: Secondary | ICD-10-CM | POA: Diagnosis not present

## 2020-03-06 DIAGNOSIS — Z789 Other specified health status: Secondary | ICD-10-CM | POA: Diagnosis not present

## 2020-03-06 DIAGNOSIS — E782 Mixed hyperlipidemia: Secondary | ICD-10-CM | POA: Diagnosis not present

## 2020-03-06 DIAGNOSIS — G4733 Obstructive sleep apnea (adult) (pediatric): Secondary | ICD-10-CM | POA: Diagnosis not present

## 2020-03-06 DIAGNOSIS — E1165 Type 2 diabetes mellitus with hyperglycemia: Secondary | ICD-10-CM | POA: Diagnosis not present

## 2020-03-06 DIAGNOSIS — K760 Fatty (change of) liver, not elsewhere classified: Secondary | ICD-10-CM | POA: Diagnosis not present

## 2020-03-14 ENCOUNTER — Other Ambulatory Visit: Payer: Self-pay | Admitting: Obstetrics and Gynecology

## 2020-03-14 DIAGNOSIS — Z1231 Encounter for screening mammogram for malignant neoplasm of breast: Secondary | ICD-10-CM

## 2020-03-15 ENCOUNTER — Other Ambulatory Visit: Payer: Medicare Other

## 2020-03-15 ENCOUNTER — Other Ambulatory Visit: Payer: Self-pay | Admitting: Critical Care Medicine

## 2020-03-15 DIAGNOSIS — Z20822 Contact with and (suspected) exposure to covid-19: Secondary | ICD-10-CM | POA: Diagnosis not present

## 2020-03-17 LAB — SARS-COV-2, NAA 2 DAY TAT

## 2020-03-17 LAB — NOVEL CORONAVIRUS, NAA: SARS-CoV-2, NAA: DETECTED — AB

## 2020-03-18 ENCOUNTER — Telehealth: Payer: Self-pay | Admitting: Family

## 2020-03-18 NOTE — Telephone Encounter (Signed)
  I connected by phone with Teresa Marquez on 03/18/2020 at 11:38 AM to discuss the potential use of a new treatment for mild to moderate COVID-19 viral infection in non-hospitalized patients.  This patient is a 79 y.o. female that meets the FDA criteria for Emergency Use Authorization of COVID monoclonal antibody casirivimab/imdevimab.  Has a (+) direct SARS-CoV-2 viral test result  Has mild or moderate COVID-19   Is NOT hospitalized due to COVID-19  Is within 10 days of symptom onset  Has at least one of the high risk factor(s) for progression to severe COVID-19 and/or hospitalization as defined in EUA.  Specific high risk criteria : Older age (>/= 78 yo)   I have spoken and communicated the following to the patient or parent/caregiver regarding COVID monoclonal antibody treatment:  1. FDA has authorized the emergency use for the treatment of mild to moderate COVID-19 in adults and pediatric patients with positive results of direct SARS-CoV-2 viral testing who are 26 years of age and older weighing at least 40 kg, and who are at high risk for progressing to severe COVID-19 and/or hospitalization.  2. The significant known and potential risks and benefits of COVID monoclonal antibody, and the extent to which such potential risks and benefits are unknown.  3. Information on available alternative treatments and the risks and benefits of those alternatives, including clinical trials.  4. Patients treated with COVID monoclonal antibody should continue to self-isolate and use infection control measures (e.g., wear mask, isolate, social distance, avoid sharing personal items, clean and disinfect "high touch" surfaces, and frequent handwashing) according to CDC guidelines.   5. The patient or parent/caregiver has the option to accept or refuse COVID monoclonal antibody treatment.  Onset of symptoms 03/11/20 in the afternoon with cough. Felt like she had a little bit of a head cold. Her nose  is a little bit like she has some congestion. Reports improvement in symptoms which she attributes to having been vaccinated.   After reviewing this information with the patient, The patient has DECLINED offer to receive the infusion.   Encouraged to call us back if she changes her mind within 10 days of symptom onset.  [name] 03/18/2020 11:38 AM

## 2020-04-13 DIAGNOSIS — H524 Presbyopia: Secondary | ICD-10-CM | POA: Diagnosis not present

## 2020-04-13 DIAGNOSIS — H2513 Age-related nuclear cataract, bilateral: Secondary | ICD-10-CM | POA: Diagnosis not present

## 2020-04-13 DIAGNOSIS — H40023 Open angle with borderline findings, high risk, bilateral: Secondary | ICD-10-CM | POA: Diagnosis not present

## 2020-04-13 DIAGNOSIS — E119 Type 2 diabetes mellitus without complications: Secondary | ICD-10-CM | POA: Diagnosis not present

## 2020-04-17 ENCOUNTER — Other Ambulatory Visit: Payer: Self-pay

## 2020-04-17 ENCOUNTER — Ambulatory Visit
Admission: RE | Admit: 2020-04-17 | Discharge: 2020-04-17 | Disposition: A | Discharge status: Home / Self Care | Payer: Medicare Other | Source: Ambulatory Visit | Attending: Obstetrics and Gynecology | Admitting: Obstetrics and Gynecology

## 2020-04-17 DIAGNOSIS — Z1231 Encounter for screening mammogram for malignant neoplasm of breast: Secondary | ICD-10-CM

## 2020-05-11 DIAGNOSIS — Z23 Encounter for immunization: Secondary | ICD-10-CM | POA: Diagnosis not present

## 2020-07-17 DIAGNOSIS — K760 Fatty (change of) liver, not elsewhere classified: Secondary | ICD-10-CM | POA: Diagnosis not present

## 2020-07-17 DIAGNOSIS — E782 Mixed hyperlipidemia: Secondary | ICD-10-CM | POA: Diagnosis not present

## 2020-07-17 DIAGNOSIS — E1165 Type 2 diabetes mellitus with hyperglycemia: Secondary | ICD-10-CM | POA: Diagnosis not present

## 2020-08-09 DIAGNOSIS — G4733 Obstructive sleep apnea (adult) (pediatric): Secondary | ICD-10-CM | POA: Diagnosis not present

## 2020-08-09 DIAGNOSIS — E1169 Type 2 diabetes mellitus with other specified complication: Secondary | ICD-10-CM | POA: Diagnosis not present

## 2020-08-09 DIAGNOSIS — N182 Chronic kidney disease, stage 2 (mild): Secondary | ICD-10-CM | POA: Diagnosis not present

## 2020-08-09 DIAGNOSIS — Z789 Other specified health status: Secondary | ICD-10-CM | POA: Diagnosis not present

## 2020-08-09 DIAGNOSIS — K760 Fatty (change of) liver, not elsewhere classified: Secondary | ICD-10-CM | POA: Diagnosis not present

## 2020-08-09 DIAGNOSIS — E782 Mixed hyperlipidemia: Secondary | ICD-10-CM | POA: Diagnosis not present

## 2020-09-22 DIAGNOSIS — D225 Melanocytic nevi of trunk: Secondary | ICD-10-CM | POA: Diagnosis not present

## 2020-09-22 DIAGNOSIS — L720 Epidermal cyst: Secondary | ICD-10-CM | POA: Diagnosis not present

## 2020-09-22 DIAGNOSIS — B078 Other viral warts: Secondary | ICD-10-CM | POA: Diagnosis not present

## 2020-09-22 DIAGNOSIS — X32XXXD Exposure to sunlight, subsequent encounter: Secondary | ICD-10-CM | POA: Diagnosis not present

## 2020-09-22 DIAGNOSIS — Z1283 Encounter for screening for malignant neoplasm of skin: Secondary | ICD-10-CM | POA: Diagnosis not present

## 2020-09-22 DIAGNOSIS — L570 Actinic keratosis: Secondary | ICD-10-CM | POA: Diagnosis not present

## 2020-10-13 DIAGNOSIS — H40023 Open angle with borderline findings, high risk, bilateral: Secondary | ICD-10-CM | POA: Diagnosis not present

## 2020-12-05 DIAGNOSIS — L570 Actinic keratosis: Secondary | ICD-10-CM | POA: Diagnosis not present

## 2020-12-05 DIAGNOSIS — X32XXXD Exposure to sunlight, subsequent encounter: Secondary | ICD-10-CM | POA: Diagnosis not present

## 2021-03-14 DIAGNOSIS — Z0000 Encounter for general adult medical examination without abnormal findings: Secondary | ICD-10-CM | POA: Diagnosis not present

## 2021-03-14 DIAGNOSIS — R5383 Other fatigue: Secondary | ICD-10-CM | POA: Diagnosis not present

## 2021-03-14 DIAGNOSIS — E782 Mixed hyperlipidemia: Secondary | ICD-10-CM | POA: Diagnosis not present

## 2021-03-14 DIAGNOSIS — E1165 Type 2 diabetes mellitus with hyperglycemia: Secondary | ICD-10-CM | POA: Diagnosis not present

## 2021-03-21 DIAGNOSIS — N182 Chronic kidney disease, stage 2 (mild): Secondary | ICD-10-CM | POA: Diagnosis not present

## 2021-03-21 DIAGNOSIS — Z23 Encounter for immunization: Secondary | ICD-10-CM | POA: Diagnosis not present

## 2021-03-21 DIAGNOSIS — L723 Sebaceous cyst: Secondary | ICD-10-CM | POA: Diagnosis not present

## 2021-03-21 DIAGNOSIS — G720 Drug-induced myopathy: Secondary | ICD-10-CM | POA: Diagnosis not present

## 2021-03-21 DIAGNOSIS — M71349 Other bursal cyst, unspecified hand: Secondary | ICD-10-CM | POA: Diagnosis not present

## 2021-03-21 DIAGNOSIS — E1165 Type 2 diabetes mellitus with hyperglycemia: Secondary | ICD-10-CM | POA: Diagnosis not present

## 2021-03-21 DIAGNOSIS — E782 Mixed hyperlipidemia: Secondary | ICD-10-CM | POA: Diagnosis not present

## 2021-03-21 DIAGNOSIS — K760 Fatty (change of) liver, not elsewhere classified: Secondary | ICD-10-CM | POA: Diagnosis not present

## 2021-03-22 ENCOUNTER — Other Ambulatory Visit: Payer: Self-pay | Admitting: Internal Medicine

## 2021-03-22 DIAGNOSIS — Z1231 Encounter for screening mammogram for malignant neoplasm of breast: Secondary | ICD-10-CM

## 2021-04-24 ENCOUNTER — Other Ambulatory Visit: Payer: Self-pay

## 2021-04-24 ENCOUNTER — Ambulatory Visit
Admission: RE | Admit: 2021-04-24 | Discharge: 2021-04-24 | Disposition: A | Discharge status: Home / Self Care | Payer: Medicare Other | Source: Ambulatory Visit | Attending: Internal Medicine | Admitting: Internal Medicine

## 2021-04-24 DIAGNOSIS — Z1231 Encounter for screening mammogram for malignant neoplasm of breast: Secondary | ICD-10-CM | POA: Diagnosis not present

## 2021-04-25 DIAGNOSIS — H0288B Meibomian gland dysfunction left eye, upper and lower eyelids: Secondary | ICD-10-CM | POA: Diagnosis not present

## 2021-04-25 DIAGNOSIS — E119 Type 2 diabetes mellitus without complications: Secondary | ICD-10-CM | POA: Diagnosis not present

## 2021-04-25 DIAGNOSIS — H04123 Dry eye syndrome of bilateral lacrimal glands: Secondary | ICD-10-CM | POA: Diagnosis not present

## 2021-04-25 DIAGNOSIS — H524 Presbyopia: Secondary | ICD-10-CM | POA: Diagnosis not present

## 2021-04-25 DIAGNOSIS — H40023 Open angle with borderline findings, high risk, bilateral: Secondary | ICD-10-CM | POA: Diagnosis not present

## 2021-04-25 DIAGNOSIS — H2513 Age-related nuclear cataract, bilateral: Secondary | ICD-10-CM | POA: Diagnosis not present

## 2021-04-25 DIAGNOSIS — H5203 Hypermetropia, bilateral: Secondary | ICD-10-CM | POA: Diagnosis not present

## 2021-05-15 DIAGNOSIS — R2232 Localized swelling, mass and lump, left upper limb: Secondary | ICD-10-CM | POA: Diagnosis not present

## 2021-05-15 DIAGNOSIS — M67432 Ganglion, left wrist: Secondary | ICD-10-CM | POA: Diagnosis not present

## 2021-06-15 DIAGNOSIS — B078 Other viral warts: Secondary | ICD-10-CM | POA: Diagnosis not present

## 2021-06-15 DIAGNOSIS — X32XXXD Exposure to sunlight, subsequent encounter: Secondary | ICD-10-CM | POA: Diagnosis not present

## 2021-06-15 DIAGNOSIS — L570 Actinic keratosis: Secondary | ICD-10-CM | POA: Diagnosis not present

## 2021-07-05 DIAGNOSIS — R2232 Localized swelling, mass and lump, left upper limb: Secondary | ICD-10-CM | POA: Diagnosis not present

## 2021-09-19 DIAGNOSIS — K760 Fatty (change of) liver, not elsewhere classified: Secondary | ICD-10-CM | POA: Diagnosis not present

## 2021-09-19 DIAGNOSIS — E782 Mixed hyperlipidemia: Secondary | ICD-10-CM | POA: Diagnosis not present

## 2021-09-19 DIAGNOSIS — N182 Chronic kidney disease, stage 2 (mild): Secondary | ICD-10-CM | POA: Diagnosis not present

## 2021-09-19 DIAGNOSIS — E1165 Type 2 diabetes mellitus with hyperglycemia: Secondary | ICD-10-CM | POA: Diagnosis not present

## 2021-09-19 DIAGNOSIS — G720 Drug-induced myopathy: Secondary | ICD-10-CM | POA: Diagnosis not present

## 2021-09-26 DIAGNOSIS — E1165 Type 2 diabetes mellitus with hyperglycemia: Secondary | ICD-10-CM | POA: Diagnosis not present

## 2021-09-26 DIAGNOSIS — G4733 Obstructive sleep apnea (adult) (pediatric): Secondary | ICD-10-CM | POA: Diagnosis not present

## 2021-09-26 DIAGNOSIS — R1012 Left upper quadrant pain: Secondary | ICD-10-CM | POA: Diagnosis not present

## 2021-09-26 DIAGNOSIS — G720 Drug-induced myopathy: Secondary | ICD-10-CM | POA: Diagnosis not present

## 2021-09-26 DIAGNOSIS — N182 Chronic kidney disease, stage 2 (mild): Secondary | ICD-10-CM | POA: Diagnosis not present

## 2021-09-26 DIAGNOSIS — E782 Mixed hyperlipidemia: Secondary | ICD-10-CM | POA: Diagnosis not present

## 2021-09-26 DIAGNOSIS — K760 Fatty (change of) liver, not elsewhere classified: Secondary | ICD-10-CM | POA: Diagnosis not present

## 2021-09-27 ENCOUNTER — Other Ambulatory Visit: Payer: Self-pay | Admitting: Internal Medicine

## 2021-10-01 ENCOUNTER — Other Ambulatory Visit: Payer: Self-pay | Admitting: Internal Medicine

## 2021-10-03 ENCOUNTER — Other Ambulatory Visit: Payer: Self-pay | Admitting: Physician Assistant

## 2021-10-03 ENCOUNTER — Other Ambulatory Visit: Payer: Self-pay | Admitting: Internal Medicine

## 2021-10-03 DIAGNOSIS — R109 Unspecified abdominal pain: Secondary | ICD-10-CM | POA: Diagnosis not present

## 2021-10-03 DIAGNOSIS — K219 Gastro-esophageal reflux disease without esophagitis: Secondary | ICD-10-CM | POA: Diagnosis not present

## 2021-10-03 DIAGNOSIS — K5900 Constipation, unspecified: Secondary | ICD-10-CM | POA: Diagnosis not present

## 2021-10-03 DIAGNOSIS — R1084 Generalized abdominal pain: Secondary | ICD-10-CM

## 2021-10-22 DIAGNOSIS — H40023 Open angle with borderline findings, high risk, bilateral: Secondary | ICD-10-CM | POA: Diagnosis not present

## 2021-10-26 ENCOUNTER — Ambulatory Visit
Admission: RE | Admit: 2021-10-26 | Discharge: 2021-10-26 | Disposition: A | Discharge status: Home / Self Care | Payer: Medicare Other | Source: Ambulatory Visit | Attending: Physician Assistant | Admitting: Physician Assistant

## 2021-10-26 DIAGNOSIS — N816 Rectocele: Secondary | ICD-10-CM | POA: Diagnosis not present

## 2021-10-26 DIAGNOSIS — N8110 Cystocele, unspecified: Secondary | ICD-10-CM | POA: Diagnosis not present

## 2021-10-26 DIAGNOSIS — N8189 Other female genital prolapse: Secondary | ICD-10-CM | POA: Diagnosis not present

## 2021-10-26 DIAGNOSIS — R1084 Generalized abdominal pain: Secondary | ICD-10-CM | POA: Diagnosis not present

## 2021-10-26 MED ORDER — IOPAMIDOL (ISOVUE-300) INJECTION 61%
100.0000 mL | Freq: Once | INTRAVENOUS | Status: AC | PRN
  Administered 2021-10-26: 100 mL via INTRAVENOUS

## 2021-10-31 DIAGNOSIS — K5900 Constipation, unspecified: Secondary | ICD-10-CM | POA: Diagnosis not present

## 2021-10-31 DIAGNOSIS — K219 Gastro-esophageal reflux disease without esophagitis: Secondary | ICD-10-CM | POA: Diagnosis not present

## 2021-12-14 ENCOUNTER — Telehealth: Payer: Self-pay

## 2021-12-14 NOTE — Telephone Encounter (Signed)
NOTES SCANNED TO REFERRAL 

## 2021-12-25 DIAGNOSIS — D225 Melanocytic nevi of trunk: Secondary | ICD-10-CM | POA: Diagnosis not present

## 2021-12-25 DIAGNOSIS — L570 Actinic keratosis: Secondary | ICD-10-CM | POA: Diagnosis not present

## 2021-12-25 DIAGNOSIS — Z1283 Encounter for screening for malignant neoplasm of skin: Secondary | ICD-10-CM | POA: Diagnosis not present

## 2021-12-25 DIAGNOSIS — X32XXXD Exposure to sunlight, subsequent encounter: Secondary | ICD-10-CM | POA: Diagnosis not present

## 2021-12-25 DIAGNOSIS — B078 Other viral warts: Secondary | ICD-10-CM | POA: Diagnosis not present

## 2021-12-30 NOTE — Progress Notes (Signed)
Cardiology Office Note:    Date:  01/11/2022   ID:  Teresa Marquez, DOB 07/13/1940, MRN [id number]  PCP:  Merrilee Seashore, MD   [facility] Providers Cardiologist:  None {  Referring MD: Merrilee Seashore, MD    History of Present Illness:    Teresa Marquez is a 81 y.o. female with a hx of DMII, HTN, HLD, and GERD who was referred by Dr. Ashby Dawes for further evaluation of HLD and chest discomfort with exertion.  Patient was seen by Dr. Ashby Dawes on 12/14/21. Note reviewed. Has known HLD but has been unable to tolerate statin therapy due to myalgias. Given elevated CV risk, she is now referred to Cardiology for further management.  A1C 7.2, TC 214, TG 178, HDL 39, LDL 139. Cr 0.83  Today, the patient overall feels okay. Was stressed over the winter and spring due to issues with a property she owned, but this has since improved. From a CV standpoint, she reports that she has occasional chest tightness with exertion. She is able to exercise at the [facility] but has noticed that she has been needing to decrease the resistance recently as she will be more short of breath and have more chest discomfort with the higher resistance. Her chest discomfort does improve some with belching but does not completely resolve. No symptoms at rest. No orthopnea, PND, dizziness, syncope or palpitations.   Did not tolerate statins due to myalgias.   Family history: Father with CABG in late 40s. Mother with HLD.  Past Medical History:  Diagnosis Date   Abdominal pain    Anxiety    Basal cell carcinoma    DDD (degenerative disc disease), lumbar    Diabetes mellitus without complication (HCC)    DM (diabetes mellitus) ([place])    Drug-induced myopathy    Elevated blood pressure reading in office without diagnosis of hypertension    GERD (gastroesophageal reflux disease)    Hyperlipidemia    Menopause    NAFLD (nonalcoholic fatty liver disease)    OSA (obstructive sleep apnea)      Past Surgical History:  Procedure Laterality Date   BREAST EXCISIONAL BIOPSY Left    benign   CARPAL TUNNEL RELEASE     FOOT SURGERY     NASAL SINUS SURGERY     pituitary adenoma resection     SHOULDER SURGERY     TOTAL HIP ARTHROPLASTY     TUBAL LIGATION      Current Medications: Current Meds  Medication Sig   aspirin 81 MG chewable tablet Chew daily by mouth.   Calcium Carb-Cholecalciferol (CALCIUM-VITAMIN D) 500-200 MG-UNIT tablet Take 1 tablet daily by mouth.   fluticasone (FLONASE) 50 MCG/ACT nasal spray Place as needed into both nostrils for allergies or rhinitis.   folic acid (FOLVITE) 381 MCG tablet Take 400 mcg daily by mouth.   metoprolol tartrate (LOPRESSOR) 100 MG tablet Take 1 tablet (100 mg total) by mouth as directed. Take one tablet 2 hours before your CT scan   Multiple Vitamins-Minerals (HAIR SKIN & NAILS ADVANCED PO) Take by mouth.   NUTRITIONAL SUPPLEMENTS PO Take by mouth.   omeprazole (PRILOSEC) 20 MG capsule Take 20 mg by mouth 2 (two) times daily before a meal.   Turmeric 500 MG CAPS Take by mouth.   vitamin B-12 (CYANOCOBALAMIN) 1000 MCG tablet Take 1,000 mcg daily by mouth.   vitamin C (ASCORBIC ACID) 500 MG tablet Take 500 mg by mouth as needed. Per patient taking in Winter, Spring,  and Fall months.     Allergies:   Bactrim [sulfamethoxazole-trimethoprim], Keflet [cephalexin], and Tetracyclines & related   Social History   Socioeconomic History   Marital status: Widowed    Spouse name: Not on file   Number of children: Not on file   Years of education: Not on file   Highest education level: Not on file  Occupational History   Not on file  Tobacco Use   Smoking status: Never   Smokeless tobacco: Never  Substance and Sexual Activity   Alcohol use: Not on file   Drug use: Not on file   Sexual activity: Not on file  Other Topics Concern   Not on file  Social History Narrative   Not on file   Social Determinants of Health   Financial  Resource Strain: Not on file  Food Insecurity: Not on file  Transportation Needs: Not on file  Physical Activity: Not on file  Stress: Not on file  Social Connections: Not on file     Family History: The patient's family history includes Cancer in her father and mother.  ROS:   Please see the history of present illness.     All other systems reviewed and are negative.  EKGs/Labs/Other Studies Reviewed:    The following studies were reviewed today: No CV studies  EKG:  EKG is  ordered today.  The ekg ordered today demonstrates NSR with HR 82  Recent Labs: No results found for requested labs within last 365 days.  Recent Lipid Panel No results found for: "CHOL", "TRIG", "HDL", "CHOLHDL", "VLDL", "LDLCALC", "LDLDIRECT"   Risk Assessment/Calculations:           Physical Exam:    VS:  BP (!) 150/80   Pulse 82   Ht '5\' 4"'$  (1.626 m)   Wt 185 lb 6.4 oz (84.1 kg)   SpO2 98%   BMI 31.82 kg/m     Wt Readings from Last 3 Encounters:  01/11/22 185 lb 6.4 oz (84.1 kg)  05/20/17 178 lb (80.7 kg)  07/31/15 180 lb (81.6 kg)     GEN:  Well nourished, well developed in no acute distress HEENT: Normal NECK: No JVD; No carotid bruits CARDIAC: RRR, 1/6 systolic murmur RUSB RESPIRATORY:  Clear to auscultation without rales, wheezing or rhonchi  ABDOMEN: Soft, non-tender, non-distended MUSCULOSKELETAL:  No edema; No deformity  SKIN: Warm and dry NEUROLOGIC:  Alert and oriented x 3 PSYCHIATRIC:  Normal affect   ASSESSMENT:    1. Precordial pain   2. Pre-procedure lab exam   3. Statin intolerance   4. Mixed hyperlipidemia   5. Primary hypertension   6. Diabetes mellitus with coincident hypertension (HCC)    PLAN:    In order of problems listed above:  #Exertional Chest Pain: Patient reports chest tightness with exertion that resolves with rest. It is overall mild in nature but seems to be progressing somewhat as she is finding she has to lower the resistance on her  exercise machine in order to make symptoms better. Will check coronary CTA for further evaluation. -Check coronary CTA  #HLD: #Statin Intolerance: Has tried 3 different statins with significant myalgias. Now on zetia but LDL 139. Interested in seeing lipid clinic. Nervous about injections but willing to learn about them further. -Continue zetia '10mg'$  daily -Refer to lipid clinic for further evaluation  #HTN: Elevated today. Will have her monitor at home and bring in a BP log for [name] to review.  #DMII: Diet controlled and followed  by PCP. Last A1C 7.2 -Management per PCP          Medication Adjustments/Labs and Tests Ordered: Current medicines are reviewed at length with the patient today.  Concerns regarding medicines are outlined above.  Orders Placed This Encounter  Procedures   CT CORONARY MORPH W/CTA COR W/SCORE W/CA W/CM &/OR WO/CM   Basic metabolic panel   AMB Referral to Heartcare Pharm-D   EKG 12-Lead   Meds ordered this encounter  Medications   metoprolol tartrate (LOPRESSOR) 100 MG tablet    Sig: Take 1 tablet (100 mg total) by mouth as directed. Take one tablet 2 hours before your CT scan    Dispense:  1 tablet    Refill:  0    Patient Instructions  Medication Instructions:  The current medical regimen is effective;  continue present plan and medications.  *If you need a refill on your cardiac medications before your next appointment, please call your pharmacy*   Lab Work: Please have blood work today (BMP)  If you have labs (blood work) drawn today and your tests are completely normal, you will receive your results only by: MyChart Message (if you have MyChart) OR A paper copy in the mail If you have any lab test that is abnormal or we need to change your treatment, we will call you to review the results.  You have been referred to our [facility] here at [facility] with our pharmacy team.  Testing/Procedures:   Your cardiac CT will be scheduled at:    [facility] [address] [place], [place] [address] [phone]  Please arrive at the [facility] and Children's Entrance (Entrance C2) of [facility] 30 minutes prior to test start time. You can use the FREE valet parking offered at entrance C (encouraged to control the heart rate for the test)  Proceed to the [facility] Radiology Department (first floor) to check-in and test prep.  All radiology patients and guests should use entrance C2 at [facility], accessed from [facility], even though the hospital's physical address listed is [address].    Please follow these instructions carefully (unless otherwise directed):   On the Night Before the Test: Be sure to Drink plenty of water. Do not consume any caffeinated/decaffeinated beverages or chocolate 12 hours prior to your test. Do not take any antihistamines 12 hours prior to your test.  On the Day of the Test: Drink plenty of water until 1 hour prior to the test. Do not eat any food 4 hours prior to the test. You may take your regular medications prior to the test.  Take metoprolol (Lopressor) two hours prior to test. HOLD Furosemide/Hydrochlorothiazide morning of the test. FEMALES- please wear underwire-free bra if available, avoid dresses & tight clothing      After the Test: Drink plenty of water. After receiving IV contrast, you may experience a mild flushed feeling. This is normal. On occasion, you may experience a mild rash up to 24 hours after the test. This is not dangerous. If this occurs, you can take Benadryl 25 mg and increase your fluid intake. If you experience trouble breathing, this can be serious. If it is severe call 911 IMMEDIATELY. If it is mild, please call our office.   We will call to schedule your test 2-4 weeks out understanding that some insurance companies will need an authorization prior to the service being performed.   For non-scheduling  related questions, please  contact the cardiac imaging nurse navigator should you have any questions/concerns: Marchia Bond, Cardiac Imaging Nurse Navigator Gordy Clement, Cardiac Imaging Nurse Navigator [facility] Direct Office Dial: [phone]   For scheduling needs, including cancellations and rescheduling, please call [place], [phone].  Follow-Up: At [facility], you and your health needs are our priority.  As part of our continuing mission to provide you with exceptional heart care, we have created designated Provider Care Teams.  These Care Teams include your primary Cardiologist (physician) and Advanced Practice Providers (APPs -  Physician Assistants and Nurse Practitioners) who all work together to provide you with the care you need, when you need it.  We recommend signing up for the patient portal called "MyChart".  Sign up information is provided on this After Visit Summary.  MyChart is used to connect with patients for Virtual Visits (Telemedicine).  Patients are able to view lab/test results, encounter notes, upcoming appointments, etc.  Non-urgent messages can be sent to your provider as well.   To learn more about what you can do with MyChart, go to [email or web address].    Your next appointment:   6 month(s)  The format for your next appointment:   In Person  Provider:   Dr Gwyndolyn Kaufman   Important Information About Sugar         Signed, Freada Bergeron, MD  01/11/2022 4:21 PM    [place]

## 2022-01-11 ENCOUNTER — Ambulatory Visit (INDEPENDENT_AMBULATORY_CARE_PROVIDER_SITE_OTHER): Payer: Medicare Other | Admitting: Cardiology

## 2022-01-11 ENCOUNTER — Encounter: Payer: Self-pay | Admitting: Cardiology

## 2022-01-11 VITALS — BP 150/80 | HR 82 | Ht 64.0 in | Wt 185.4 lb

## 2022-01-11 DIAGNOSIS — Z789 Other specified health status: Secondary | ICD-10-CM

## 2022-01-11 DIAGNOSIS — E782 Mixed hyperlipidemia: Secondary | ICD-10-CM | POA: Diagnosis not present

## 2022-01-11 DIAGNOSIS — Z01812 Encounter for preprocedural laboratory examination: Secondary | ICD-10-CM

## 2022-01-11 DIAGNOSIS — E119 Type 2 diabetes mellitus without complications: Secondary | ICD-10-CM | POA: Diagnosis not present

## 2022-01-11 DIAGNOSIS — I10 Essential (primary) hypertension: Secondary | ICD-10-CM

## 2022-01-11 DIAGNOSIS — R072 Precordial pain: Secondary | ICD-10-CM | POA: Diagnosis not present

## 2022-01-11 MED ORDER — METOPROLOL TARTRATE 100 MG PO TABS: 100.0000 mg | ORAL_TABLET | ORAL | 0 refills | Status: DC

## 2022-01-11 NOTE — Patient Instructions (Signed)
Medication Instructions:  The current medical regimen is effective;  continue present plan and medications.  *If you need a refill on your cardiac medications before your next appointment, please call your pharmacy*   Lab Work: Please have blood work today (BMP)  If you have labs (blood work) drawn today and your tests are completely normal, you will receive your results only by: MyChart Message (if you have MyChart) OR A paper copy in the mail If you have any lab test that is abnormal or we need to change your treatment, we will call you to review the results.  You have been referred to our [place] [facility] here at [facility] with our pharmacy team.  Testing/Procedures:   Your cardiac CT will be scheduled at:   [facility] [address] [place], [place] [address] [phone]  Please arrive at the [facility] and Children's Entrance (Entrance C2) of [facility] 30 minutes prior to test start time. You can use the FREE valet parking offered at entrance C (encouraged to control the heart rate for the test)  Proceed to the [facility] Radiology Department (first floor) to check-in and test prep.  All radiology patients and guests should use entrance C2 at [facility], accessed from [facility], even though the hospital's physical address listed is [address]    Please follow these instructions carefully (unless otherwise directed):   On the Night Before the Test: Be sure to Drink plenty of water. Do not consume any caffeinated/decaffeinated beverages or chocolate 12 hours prior to your test. Do not take any antihistamines 12 hours prior to your test.  On the Day of the Test: Drink plenty of water until 1 hour prior to the test. Do not eat any food 4 hours prior to the test. You may take your regular medications prior to the test.  Take metoprolol (Lopressor) two hours prior to test. HOLD Furosemide/Hydrochlorothiazide morning  of the test. FEMALES- please wear underwire-free bra if available, avoid dresses & tight clothing      After the Test: Drink plenty of water. After receiving IV contrast, you may experience a mild flushed feeling. This is normal. On occasion, you may experience a mild rash up to 24 hours after the test. This is not dangerous. If this occurs, you can take Benadryl 25 mg and increase your fluid intake. If you experience trouble breathing, this can be serious. If it is severe call 911 IMMEDIATELY. If it is mild, please call our office.   We will call to schedule your test 2-4 weeks out understanding that some insurance companies will need an authorization prior to the service being performed.   For non-scheduling related questions, please contact the cardiac imaging nurse navigator should you have any questions/concerns: Marchia Bond, Cardiac Imaging Nurse Navigator Gordy Clement, Cardiac Imaging Nurse Navigator [place] [facility] Direct Office Dial: [phone]   For scheduling needs, including cancellations and rescheduling, please call [place], [phone].  Follow-Up: At [facility], you and your health needs are our priority.  As part of our continuing mission to provide you with exceptional heart care, we have created designated Provider Care Teams.  These Care Teams include your primary Cardiologist (physician) and Advanced Practice Providers (APPs -  Physician Assistants and Nurse Practitioners) who all work together to provide you with the care you need, when you need it.  We recommend signing up for the patient portal called "MyChart".  Sign up information is  provided on this After Visit Summary.  MyChart is used to connect with patients for Virtual Visits (Telemedicine).  Patients are able to view lab/test results, encounter notes, upcoming appointments, etc.  Non-urgent messages can be sent to your provider as well.   To learn more about what you can do  with MyChart, go to [email or web address].    Your next appointment:   6 month(s)  The format for your next appointment:   In Person  Provider:   Dr Gwyndolyn Kaufman   Important Information About Sugar

## 2022-01-12 LAB — BASIC METABOLIC PANEL
BUN/Creatinine Ratio: 14 (ref 12–28)
BUN: 11 mg/dL (ref 8–27)
CO2: 23 mmol/L (ref 20–29)
Calcium: 9.4 mg/dL (ref 8.7–10.3)
Chloride: 97 mmol/L (ref 96–106)
Creatinine, Ser: 0.8 mg/dL (ref 0.57–1.00)
Glucose: 126 mg/dL — ABNORMAL HIGH (ref 70–99)
Potassium: 4.1 mmol/L (ref 3.5–5.2)
Sodium: 133 mmol/L — ABNORMAL LOW (ref 134–144)
eGFR: 75 mL/min/{1.73_m2} (ref >59)

## 2022-01-30 ENCOUNTER — Ambulatory Visit (INDEPENDENT_AMBULATORY_CARE_PROVIDER_SITE_OTHER): Payer: Medicare Other | Admitting: Pharmacist

## 2022-01-30 DIAGNOSIS — E782 Mixed hyperlipidemia: Secondary | ICD-10-CM | POA: Insufficient documentation

## 2022-01-30 DIAGNOSIS — R030 Elevated blood-pressure reading, without diagnosis of hypertension: Secondary | ICD-10-CM

## 2022-01-30 NOTE — Patient Instructions (Addendum)
Repatha or Praluent (PCKS9i) (every 2 week shot)  Leqvio- every 6 month shot [employer])  Keep track of your blood pressure. Let us know if it is consistently >130/80  Try to increase your exercise. Make it a goal to go for a walk once a day

## 2022-01-30 NOTE — Progress Notes (Signed)
Patient ID: Teresa Marquez                 DOB: 11/09/1940                    MRN: [id number]     HPI: Teresa Marquez is a 81 y.o. female patient referred to lipid clinic by Dr. Johney Frame. PMH is significant for DMII, HTN, HLD, and GERD. Patient saw Dr. Johney Frame on 01/11/22. Reported increased chest tightness with exercise. Coronary CTA ordered. Patient has history of statin intolerance. Currently only on ezetimibe '10mg'$  daily.   Patient presents today to lipid clinic. She states that when she took the generic rosuvastatin her feet went numb and she has joint pain and weakness. She does not remember what happened with atorvastatin. She states she will never take a statin again. She has medicare A, B, supplement and part D plan. She usually works out at [employer] for an hour 3 times a week but hasn't gone in a few weeks. The heat bothers her. Looking forward to going to the beach with her kids.   Current Medications: ezetimibe '10mg'$  daily Intolerances: atorvastatin, rosuvastatin (numbness in feet, weakness, joint pain), lescol (numbness in feet) Risk Factors: DM, HTN LDL-C goal: <70 (could be <55 depending on coronary CTA)  Diet: nuts, fruit,  Breakfast: cereal, oatmeal, pancakes, waffles sometimes, eggs Chicken, doesn't eat much beef, seafood Drink: water, 2 cups of coffee -black  Exercise: usually goes to the [employer] 3 days a week, octane machine 30 min, hasn't been in a few weeks  Family History: Father with CABG in late 41s. Mother with HLD  Social History: never smoked, very little alcohol use  Labs: 09/19/21 TC 214 HDL 39 TG 178 LDL-C 139 (ezetimibe '10mg'$  daily)  Past Medical History:  Diagnosis Date   Abdominal pain    Anxiety    Basal cell carcinoma    DDD (degenerative disc disease), lumbar    Diabetes mellitus without complication (HCC)    DM (diabetes mellitus) ([place])    Drug-induced myopathy    Elevated blood pressure reading in office without diagnosis of hypertension     GERD (gastroesophageal reflux disease)    Hyperlipidemia    Menopause    NAFLD (nonalcoholic fatty liver disease)    OSA (obstructive sleep apnea)     Current Outpatient Medications on File Prior to Visit  Medication Sig Dispense Refill   aspirin 81 MG chewable tablet Chew daily by mouth.     Calcium Carb-Cholecalciferol (CALCIUM-VITAMIN D) 500-200 MG-UNIT tablet Take 1 tablet daily by mouth.     fluticasone (FLONASE) 50 MCG/ACT nasal spray Place as needed into both nostrils for allergies or rhinitis.     folic acid (FOLVITE) 106 MCG tablet Take 400 mcg daily by mouth.     metoprolol tartrate (LOPRESSOR) 100 MG tablet Take 1 tablet (100 mg total) by mouth as directed. Take one tablet 2 hours before your CT scan 1 tablet 0   Multiple Vitamins-Minerals (HAIR SKIN & NAILS ADVANCED PO) Take by mouth.     NUTRITIONAL SUPPLEMENTS PO Take by mouth.     omeprazole (PRILOSEC) 20 MG capsule Take 20 mg by mouth 2 (two) times daily before a meal.     Turmeric 500 MG CAPS Take by mouth.     vitamin B-12 (CYANOCOBALAMIN) 1000 MCG tablet Take 1,000 mcg daily by mouth.     vitamin C (ASCORBIC ACID) 500 MG tablet Take 500 mg by mouth as  needed. Per patient taking in Winter, Spring, and Fall months.     No current facility-administered medications on file prior to visit.    Allergies  Allergen Reactions   Bactrim [Sulfamethoxazole-Trimethoprim]     Heart racing   Keflet [Cephalexin]    Tetracyclines & Related     Assessment/Plan:  1. Hyperlipidemia - LDL-C is above goal of <70. Discussed options with patient including PCKS9i and Leqvio. Reviewed side effects, cost, injection technique and efficacy with both. She is aware that there is no outcomes data available yet for Leqvio. Patient would like to looks up the medications and review before making a decision. She was given my card to call me when she decides. She has her coronary CTA on Monday.   We discussed the 5 modifiable risk factors for  CVD (lipids, BP, Tobacco use, diet and exercise) BP is currently being monitored at home. It was high in clinic, she had a reading in the 120 's/70's on Monday. Yesterday it was high because she was nervous about today's visit per patient. She will continue to monitor and let us know if it is consistently >130/80. Patient does not use tobacco. Lipids discussed above. I have encouraged patient to eat a diet of real food, ie avoid processed foods (ie most foods with a nutrition label) and watch for added sugars (yogurt, ketchup, salad dressings are big culprits) and sodium. Limit alcohol and no sugary beverages (soda, sweet tea, juice). I have encouraged patient to increase exercise. Daily walking and or exercising at the [facility].   Thank you,   Ramond Dial, Pharm.D, BCPS, CPP [place]  [address] [employer] [place], [place] [address]  Phone: [phone]; Fax: [phone]

## 2022-02-01 ENCOUNTER — Telehealth (HOSPITAL_COMMUNITY): Payer: Self-pay | Admitting: *Deleted

## 2022-02-01 NOTE — Telephone Encounter (Signed)
Reaching out to patient to offer assistance regarding upcoming cardiac imaging study; pt verbalizes understanding of appt date/time, parking situation and where to check in, pre-test NPO status and medications ordered, and verified current allergies; name and call back number provided for further questions should they arise  Teresa Clement RN Navigator Cardiac Imaging [facility] [phone] office [phone] cell  Patient to take '100mg'$  metoprolol tartrate two hours prior to her cardiac CT scan. She is aware to arrive at 10:30pm.

## 2022-02-04 ENCOUNTER — Ambulatory Visit (HOSPITAL_COMMUNITY)
Admission: RE | Admit: 2022-02-04 | Discharge: 2022-02-04 | Disposition: A | Discharge status: Home / Self Care | Payer: Medicare Other | Source: Ambulatory Visit | Attending: Cardiology | Admitting: Cardiology

## 2022-02-04 ENCOUNTER — Other Ambulatory Visit: Payer: Self-pay | Admitting: Cardiology

## 2022-02-04 DIAGNOSIS — I2510 Atherosclerotic heart disease of native coronary artery without angina pectoris: Secondary | ICD-10-CM | POA: Diagnosis not present

## 2022-02-04 DIAGNOSIS — R072 Precordial pain: Secondary | ICD-10-CM | POA: Diagnosis not present

## 2022-02-04 DIAGNOSIS — R931 Abnormal findings on diagnostic imaging of heart and coronary circulation: Secondary | ICD-10-CM | POA: Diagnosis not present

## 2022-02-04 MED ORDER — NITROGLYCERIN 0.4 MG SL SUBL
0.8000 mg | SUBLINGUAL_TABLET | Freq: Once | SUBLINGUAL | Status: AC
  Administered 2022-02-04: 0.8 mg via SUBLINGUAL

## 2022-02-04 MED ORDER — NITROGLYCERIN 0.4 MG SL SUBL
SUBLINGUAL_TABLET | SUBLINGUAL | Status: AC
  Filled 2022-02-04: qty 2

## 2022-02-04 MED ORDER — IOHEXOL 350 MG/ML SOLN
100.0000 mL | Freq: Once | INTRAVENOUS | Status: AC | PRN
  Administered 2022-02-04: 100 mL via INTRAVENOUS

## 2022-02-06 ENCOUNTER — Other Ambulatory Visit (HOSPITAL_COMMUNITY): Payer: Self-pay | Admitting: *Deleted

## 2022-02-06 ENCOUNTER — Ambulatory Visit (HOSPITAL_COMMUNITY)
Admission: RE | Admit: 2022-02-06 | Discharge: 2022-02-06 | Disposition: A | Discharge status: Home / Self Care | Payer: Medicare Other | Source: Ambulatory Visit | Attending: Cardiology | Admitting: Cardiology

## 2022-02-06 ENCOUNTER — Ambulatory Visit (HOSPITAL_BASED_OUTPATIENT_CLINIC_OR_DEPARTMENT_OTHER)
Admission: RE | Admit: 2022-02-06 | Discharge: 2022-02-06 | Disposition: A | Discharge status: Home / Self Care | Payer: Medicare Other | Source: Ambulatory Visit | Attending: Cardiology | Admitting: Cardiology

## 2022-02-06 DIAGNOSIS — R072 Precordial pain: Secondary | ICD-10-CM | POA: Diagnosis not present

## 2022-02-06 DIAGNOSIS — R931 Abnormal findings on diagnostic imaging of heart and coronary circulation: Secondary | ICD-10-CM

## 2022-02-07 ENCOUNTER — Telehealth: Payer: Self-pay | Admitting: Cardiology

## 2022-02-07 NOTE — H&P (View-Only) (Signed)
Office Visit    Patient Name: Teresa Marquez Date of Encounter: 02/11/2022  Primary Care Provider:  Merrilee Seashore, MD Primary Cardiologist:  None Primary Electrophysiologist: None  Chief Complaint    Teresa Marquez is a 81 y.o. female with PMH of HTN, HLD, GERD, DM type II, NAFLD who presents today for follow-up of chest discomfort with exertion and to review coronary CTA.  Past Medical History    Past Medical History:  Diagnosis Date   Abdominal pain    Anxiety    Basal cell carcinoma    DDD (degenerative disc disease), lumbar    Diabetes mellitus without complication (HCC)    DM (diabetes mellitus) ([place])    Drug-induced myopathy    Elevated blood pressure reading in office without diagnosis of hypertension    GERD (gastroesophageal reflux disease)    Hyperlipidemia    Menopause    NAFLD (nonalcoholic fatty liver disease)    OSA (obstructive sleep apnea)    Past Surgical History:  Procedure Laterality Date   BREAST EXCISIONAL BIOPSY Left    benign   CARPAL TUNNEL RELEASE     FOOT SURGERY     NASAL SINUS SURGERY     pituitary adenoma resection     SHOULDER SURGERY     TOTAL HIP ARTHROPLASTY     TUBAL LIGATION      Allergies  Allergies  Allergen Reactions   Bactrim [Sulfamethoxazole-Trimethoprim]     Heart racing   Keflet [Cephalexin]    Tetracyclines & Related     History of Present Illness    Teresa Marquez is a 81 year old female with the above-mentioned past medical history who presents today for follow-up of coronary CTA due to chest tightness.  Teresa Marquez was referred to Dr. Johney Frame and was seen initially on 01/11/2022 for complaint of statin intolerance and chest discomfort.  Patient reported that she is able to exercise with some increased shortness of breath and chest discomfort when doing high resistant activities.  Discomfort does improve with belching but does not completely resolve and is not associated with  dizziness, syncope, orthopnea.  Coronary CTA was ordered and completed that revealed total occlusion of proximal mid LAD with recommendation of cardiac catheterization for further evaluation.  Since last being seen in the office patient reports today that she is having chest pain across the chest similar to a panic attack. She was at the beach this weekend and felt a similar pain also down her arms at the beach. She denies palpitation, nausea, or dizziness. Dr. Quentin Ore the DOD evaluated the patient with me and suggested that she go the the ED for further evaluation.   Home Medications    Current Outpatient Medications  Medication Sig Dispense Refill   aspirin 81 MG chewable tablet Chew daily by mouth.     fluticasone (FLONASE) 50 MCG/ACT nasal spray Place as needed into both nostrils for allergies or rhinitis.     folic acid (FOLVITE) 371 MCG tablet Take 400 mcg daily by mouth.     Multiple Vitamins-Minerals (HAIR SKIN & NAILS ADVANCED PO) Take by mouth.     NUTRITIONAL SUPPLEMENTS PO Take by mouth.     omeprazole (PRILOSEC) 20 MG capsule Take 10 mg by mouth as needed.     Turmeric 500 MG CAPS Take by mouth.     vitamin B-12 (CYANOCOBALAMIN) 1000 MCG tablet Take 1,000 mcg daily by mouth.     vitamin C (ASCORBIC ACID) 500 MG tablet Take  500 mg by mouth as needed. Per patient taking in Winter, Spring, and Fall months.     No current facility-administered medications for this visit.     Review of Systems  Please see the history of present illness.    (+)Chest pain  All other systems reviewed and are otherwise negative except as noted above.  Physical Exam    Wt Readings from Last 3 Encounters:  02/11/22 188 lb 12.8 oz (85.6 kg)  01/11/22 185 lb 6.4 oz (84.1 kg)  05/20/17 178 lb (80.7 kg)   VS: Vitals:   02/11/22 1335  BP: (!) 150/80  Pulse: 90  ,Body mass index is 32.41 kg/m.  Constitutional:      Appearance: Healthy appearance. Not in distress.  Neck:     Vascular: JVD  normal.  Pulmonary:     Effort: Pulmonary effort is normal.     Breath sounds: No wheezing. No rales. Diminished in the bases Cardiovascular:     Normal rate. Regular rhythm. Normal S1. Normal S2.      Murmurs: There is no murmur.  Edema:    Peripheral edema absent.  Abdominal:     Palpations: Abdomen is soft non tender. There is no hepatomegaly.  Skin:    General: Skin is warm and dry.  Neurological:     General: No focal deficit present.     Mental Status: Alert and oriented to person, place and time.     Cranial Nerves: Cranial nerves are intact.  EKG/LABS/Other Studies Reviewed    ECG personally reviewed by me today - Sinus Rhythm with possible anterior lateral infarct and TWI in leads II. III. AVF, V1-V6 Risk Assessment/Calculations:           Lab Results  Component Value Date   WBC 7.7 09/14/2009   HGB 10.8 DELTA CHECK NOTED REPEATED TO VERIFY (L) 09/14/2009   HCT 31.2 (L) 09/14/2009   MCV 95.1 09/14/2009   PLT 161 09/14/2009   Lab Results  Component Value Date   CREATININE 0.80 01/11/2022   BUN 11 01/11/2022   NA 133 (L) 01/11/2022   K 4.1 01/11/2022   CL 97 01/11/2022   CO2 23 01/11/2022   Lab Results  Component Value Date   ALT 25 09/07/2009   AST 22 09/07/2009   ALKPHOS 67 09/07/2009   BILITOT 0.5 09/07/2009   No results found for: "CHOL", "HDL", "LDLCALC", "LDLDIRECT", "TRIG", "CHOLHDL"  No results found for: "HGBA1C"  Assessment & Plan    1.  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 -LHC to further evaluate abnormal cardiac CTA results   Disposition:   cation Adjustments/Labs and Tests Ordered: Current medicines are reviewed at length with the patient today.  Concerns regarding medicines are outlined above.   Signed, Mable Fill, Marissa Nestle, NP 02/11/2022, 2:21 PM [place]    ------------------   I have seen, examined the patient, and reviewed the above assessment and plan.    Patient with chest pain and new EKG findings concerning for ischemia.  She will present to [facility] via EMS for urgent/emergent left heart catheterization.  I discussed the plan with patient who is agreeable.  Hospital team aware.   Vickie Epley, MD 02/11/2022 2:28 PM

## 2022-02-07 NOTE — Telephone Encounter (Signed)
Patient would like the results to her CT Scan.  He is leaving for vacation at [place] today.

## 2022-02-07 NOTE — Progress Notes (Addendum)
Office Visit    Patient Name: Teresa Marquez Date of Encounter: 02/11/2022  Primary Care Provider:  Merrilee Seashore, MD Primary Cardiologist:  None Primary Electrophysiologist: None  Chief Complaint    Teresa Marquez is a 81 y.o. female with PMH of HTN, HLD, GERD, DM type II, NAFLD who presents today for follow-up of chest discomfort with exertion and to review coronary CTA.  Past Medical History    Past Medical History:  Diagnosis Date   Abdominal pain    Anxiety    Basal cell carcinoma    DDD (degenerative disc disease), lumbar    Diabetes mellitus without complication (HCC)    DM (diabetes mellitus) ([place])    Drug-induced myopathy    Elevated blood pressure reading in office without diagnosis of hypertension    GERD (gastroesophageal reflux disease)    Hyperlipidemia    Menopause    NAFLD (nonalcoholic fatty liver disease)    OSA (obstructive sleep apnea)    Past Surgical History:  Procedure Laterality Date   BREAST EXCISIONAL BIOPSY Left    benign   CARPAL TUNNEL RELEASE     FOOT SURGERY     NASAL SINUS SURGERY     pituitary adenoma resection     SHOULDER SURGERY     TOTAL HIP ARTHROPLASTY     TUBAL LIGATION      Allergies  Allergies  Allergen Reactions   Bactrim [Sulfamethoxazole-Trimethoprim]     Heart racing   Keflet [Cephalexin]    Tetracyclines & Related     History of Present Illness    Teresa Marquez is a 81 year old female with the above-mentioned past medical history who presents today for follow-up of coronary CTA due to chest tightness.  Teresa Marquez was referred to Dr. Johney Frame and was seen initially on 01/11/2022 for complaint of statin intolerance and chest discomfort.  Patient reported that she is able to exercise with some increased shortness of breath and chest discomfort when doing high resistant activities.  Discomfort does improve with belching but does not completely resolve and is not associated with  dizziness, syncope, orthopnea.  Coronary CTA was ordered and completed that revealed total occlusion of proximal mid LAD with recommendation of cardiac catheterization for further evaluation.  Since last being seen in the office patient reports today that she is having chest pain across the chest similar to a panic attack. She was at the beach this weekend and felt a similar pain also down her arms at the beach. She denies palpitation, nausea, or dizziness. Dr. Quentin Ore the DOD evaluated the patient with me and suggested that she go the the ED for further evaluation.   Home Medications    Current Outpatient Medications  Medication Sig Dispense Refill   aspirin 81 MG chewable tablet Chew daily by mouth.     fluticasone (FLONASE) 50 MCG/ACT nasal spray Place as needed into both nostrils for allergies or rhinitis.     folic acid (FOLVITE) 542 MCG tablet Take 400 mcg daily by mouth.     Multiple Vitamins-Minerals (HAIR SKIN & NAILS ADVANCED PO) Take by mouth.     NUTRITIONAL SUPPLEMENTS PO Take by mouth.     omeprazole (PRILOSEC) 20 MG capsule Take 10 mg by mouth as needed.     Turmeric 500 MG CAPS Take by mouth.     vitamin B-12 (CYANOCOBALAMIN) 1000 MCG tablet Take 1,000 mcg daily by mouth.     vitamin C (ASCORBIC ACID) 500 MG tablet Take  500 mg by mouth as needed. Per patient taking in Winter, Spring, and Fall months.     No current facility-administered medications for this visit.     Review of Systems  Please see the history of present illness.    (+)Chest pain  All other systems reviewed and are otherwise negative except as noted above.  Physical Exam    Wt Readings from Last 3 Encounters:  02/11/22 188 lb 12.8 oz (85.6 kg)  01/11/22 185 lb 6.4 oz (84.1 kg)  05/20/17 178 lb (80.7 kg)   VS: Vitals:   02/11/22 1335  BP: (!) 150/80  Pulse: 90  ,Body mass index is 32.41 kg/m.  Constitutional:      Appearance: Healthy appearance. Not in distress.  Neck:     Vascular: JVD  normal.  Pulmonary:     Effort: Pulmonary effort is normal.     Breath sounds: No wheezing. No rales. Diminished in the bases Cardiovascular:     Normal rate. Regular rhythm. Normal S1. Normal S2.      Murmurs: There is no murmur.  Edema:    Peripheral edema absent.  Abdominal:     Palpations: Abdomen is soft non tender. There is no hepatomegaly.  Skin:    General: Skin is warm and dry.  Neurological:     General: No focal deficit present.     Mental Status: Alert and oriented to person, place and time.     Cranial Nerves: Cranial nerves are intact.  EKG/LABS/Other Studies Reviewed    ECG personally reviewed by me today - Sinus Rhythm with possible anterior lateral infarct and TWI in leads II. III. AVF, V1-V6 Risk Assessment/Calculations:           Lab Results  Component Value Date   WBC 7.7 09/14/2009   HGB 10.8 DELTA CHECK NOTED REPEATED TO VERIFY (L) 09/14/2009   HCT 31.2 (L) 09/14/2009   MCV 95.1 09/14/2009   PLT 161 09/14/2009   Lab Results  Component Value Date   CREATININE 0.80 01/11/2022   BUN 11 01/11/2022   NA 133 (L) 01/11/2022   K 4.1 01/11/2022   CL 97 01/11/2022   CO2 23 01/11/2022   Lab Results  Component Value Date   ALT 25 09/07/2009   AST 22 09/07/2009   ALKPHOS 67 09/07/2009   BILITOT 0.5 09/07/2009   No results found for: "CHOL", "HDL", "LDLCALC", "LDLDIRECT", "TRIG", "CHOLHDL"  No results found for: "HGBA1C"  Assessment & Plan    1.  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 -LHC to further evaluate abnormal cardiac CTA results   Disposition:   cation Adjustments/Labs and Tests Ordered: Current medicines are reviewed at length with the patient today.  Concerns regarding medicines are outlined above.   Signed, Mable Fill, Marissa Nestle, NP 02/11/2022, 2:21 PM [place]    ------------------   I have seen, examined the patient, and reviewed the above assessment and plan.    Patient with chest pain and new EKG findings concerning for ischemia.  She will present to [facility] via EMS for urgent/emergent left heart catheterization.  I discussed the plan with patient who is agreeable.  Hospital team aware.   Vickie Epley, MD 02/11/2022 2:28 PM

## 2022-02-07 NOTE — Telephone Encounter (Signed)
Dr. Johney Frame spoke with the pt about Cardiac CT results and plan for needed cath.  Pt will need an OV with updated H&P, labs, EKG, and cath scheduled at this visit.  Pt agreed to appt with Ambrose Pancoast NP on next Monday 02/11/22 at 1:30 pm vs 2:15 pm to arrange cath.   She is now scheduled for next Monday 8/7 at 1:30 pm and is aware to arrive 15 mins prior to that appt.  Pt verbalized understanding and agrees with this plan.

## 2022-02-07 NOTE — Telephone Encounter (Signed)
Dr. Johney Frame, the pt is calling in very anxious over her Cardiac CT results she viewed in mychart.  She states she googled terminology and her diagnosis seems poor.  She states she is leaving for the beach at 0900 today and would like to know the results of her test, and if it's safe to travel to [place] based on these results.   She states she is feeling much better since her last OV with you.  She adds she will not be back in town until Sunday.  She states you can call her on her home phone until 9 am then after that you will need to try and reach her cell phone.   Dr. Johney Frame, if she needs a cath, she will need an updated OV, H&P, labs, and EKG.  I have held her a slot on Ambrose Pancoast NP schedule for next Monday 8/7 at  2:15 pm.  She will be back in town at that time.  Please ask her if she can come into the office for then and I will be glad to arrange that appt.  Thanks so much!

## 2022-02-11 ENCOUNTER — Encounter (HOSPITAL_COMMUNITY)
Admission: EM | Disposition: A | Discharge status: Home Health | Payer: Self-pay | Source: Ambulatory Visit | Attending: Thoracic Surgery (Cardiothoracic Vascular Surgery)

## 2022-02-11 ENCOUNTER — Ambulatory Visit (INDEPENDENT_AMBULATORY_CARE_PROVIDER_SITE_OTHER): Payer: Medicare Other | Admitting: Nurse Practitioner

## 2022-02-11 ENCOUNTER — Inpatient Hospital Stay (HOSPITAL_COMMUNITY)
Admission: EM | Admit: 2022-02-11 | Discharge: 2022-02-18 | DRG: 234 | Disposition: A | Discharge status: Home Health | Payer: Medicare Other | Source: Ambulatory Visit | Attending: Thoracic Surgery (Cardiothoracic Vascular Surgery) | Admitting: Thoracic Surgery (Cardiothoracic Vascular Surgery)

## 2022-02-11 ENCOUNTER — Other Ambulatory Visit: Payer: Self-pay

## 2022-02-11 ENCOUNTER — Encounter: Payer: Self-pay | Admitting: Nurse Practitioner

## 2022-02-11 ENCOUNTER — Encounter (HOSPITAL_COMMUNITY): Payer: Self-pay

## 2022-02-11 ENCOUNTER — Ambulatory Visit (HOSPITAL_COMMUNITY): Admit: 2022-02-11 | Payer: Medicare Other | Admitting: Cardiovascular Disease

## 2022-02-11 VITALS — BP 150/80 | HR 90 | Ht 64.0 in | Wt 188.8 lb

## 2022-02-11 DIAGNOSIS — Z881 Allergy status to other antibiotic agents status: Secondary | ICD-10-CM | POA: Diagnosis not present

## 2022-02-11 DIAGNOSIS — F419 Anxiety disorder, unspecified: Secondary | ICD-10-CM | POA: Diagnosis present

## 2022-02-11 DIAGNOSIS — I3139 Other pericardial effusion (noninflammatory): Secondary | ICD-10-CM | POA: Diagnosis present

## 2022-02-11 DIAGNOSIS — G4733 Obstructive sleep apnea (adult) (pediatric): Secondary | ICD-10-CM | POA: Diagnosis not present

## 2022-02-11 DIAGNOSIS — R931 Abnormal findings on diagnostic imaging of heart and coronary circulation: Secondary | ICD-10-CM | POA: Diagnosis not present

## 2022-02-11 DIAGNOSIS — D62 Acute posthemorrhagic anemia: Secondary | ICD-10-CM | POA: Diagnosis not present

## 2022-02-11 DIAGNOSIS — T462X5A Adverse effect of other antidysrhythmic drugs, initial encounter: Secondary | ICD-10-CM | POA: Diagnosis not present

## 2022-02-11 DIAGNOSIS — I200 Unstable angina: Secondary | ICD-10-CM | POA: Diagnosis present

## 2022-02-11 DIAGNOSIS — E119 Type 2 diabetes mellitus without complications: Secondary | ICD-10-CM | POA: Diagnosis present

## 2022-02-11 DIAGNOSIS — I2582 Chronic total occlusion of coronary artery: Secondary | ICD-10-CM | POA: Diagnosis present

## 2022-02-11 DIAGNOSIS — E7800 Pure hypercholesterolemia, unspecified: Secondary | ICD-10-CM | POA: Diagnosis present

## 2022-02-11 DIAGNOSIS — E669 Obesity, unspecified: Secondary | ICD-10-CM | POA: Diagnosis present

## 2022-02-11 DIAGNOSIS — I10 Essential (primary) hypertension: Secondary | ICD-10-CM | POA: Diagnosis not present

## 2022-02-11 DIAGNOSIS — E871 Hypo-osmolality and hyponatremia: Secondary | ICD-10-CM | POA: Diagnosis present

## 2022-02-11 DIAGNOSIS — R110 Nausea: Secondary | ICD-10-CM | POA: Diagnosis not present

## 2022-02-11 DIAGNOSIS — Z951 Presence of aortocoronary bypass graft: Secondary | ICD-10-CM

## 2022-02-11 DIAGNOSIS — I25110 Atherosclerotic heart disease of native coronary artery with unstable angina pectoris: Principal | ICD-10-CM | POA: Diagnosis present

## 2022-02-11 DIAGNOSIS — Z882 Allergy status to sulfonamides status: Secondary | ICD-10-CM

## 2022-02-11 DIAGNOSIS — I2510 Atherosclerotic heart disease of native coronary artery without angina pectoris: Secondary | ICD-10-CM | POA: Diagnosis present

## 2022-02-11 DIAGNOSIS — M1990 Unspecified osteoarthritis, unspecified site: Secondary | ICD-10-CM | POA: Diagnosis not present

## 2022-02-11 DIAGNOSIS — E782 Mixed hyperlipidemia: Secondary | ICD-10-CM | POA: Diagnosis not present

## 2022-02-11 DIAGNOSIS — Z6832 Body mass index (BMI) 32.0-32.9, adult: Secondary | ICD-10-CM

## 2022-02-11 DIAGNOSIS — I209 Angina pectoris, unspecified: Secondary | ICD-10-CM | POA: Diagnosis not present

## 2022-02-11 DIAGNOSIS — J9811 Atelectasis: Secondary | ICD-10-CM | POA: Diagnosis not present

## 2022-02-11 DIAGNOSIS — R0789 Other chest pain: Secondary | ICD-10-CM | POA: Diagnosis not present

## 2022-02-11 DIAGNOSIS — I480 Paroxysmal atrial fibrillation: Secondary | ICD-10-CM | POA: Diagnosis present

## 2022-02-11 DIAGNOSIS — K219 Gastro-esophageal reflux disease without esophagitis: Secondary | ICD-10-CM | POA: Diagnosis present

## 2022-02-11 DIAGNOSIS — R072 Precordial pain: Principal | ICD-10-CM

## 2022-02-11 DIAGNOSIS — Z20822 Contact with and (suspected) exposure to covid-19: Secondary | ICD-10-CM | POA: Diagnosis present

## 2022-02-11 DIAGNOSIS — Z79899 Other long term (current) drug therapy: Secondary | ICD-10-CM

## 2022-02-11 DIAGNOSIS — J90 Pleural effusion, not elsewhere classified: Secondary | ICD-10-CM | POA: Diagnosis not present

## 2022-02-11 DIAGNOSIS — Z4682 Encounter for fitting and adjustment of non-vascular catheter: Secondary | ICD-10-CM | POA: Diagnosis not present

## 2022-02-11 DIAGNOSIS — K5900 Constipation, unspecified: Secondary | ICD-10-CM | POA: Diagnosis present

## 2022-02-11 DIAGNOSIS — I97791 Other intraoperative cardiac functional disturbances during other surgery: Secondary | ICD-10-CM | POA: Diagnosis not present

## 2022-02-11 DIAGNOSIS — K760 Fatty (change of) liver, not elsewhere classified: Secondary | ICD-10-CM | POA: Diagnosis present

## 2022-02-11 DIAGNOSIS — Z96649 Presence of unspecified artificial hip joint: Secondary | ICD-10-CM | POA: Diagnosis present

## 2022-02-11 DIAGNOSIS — R079 Chest pain, unspecified: Secondary | ICD-10-CM | POA: Diagnosis not present

## 2022-02-11 DIAGNOSIS — I25119 Atherosclerotic heart disease of native coronary artery with unspecified angina pectoris: Secondary | ICD-10-CM | POA: Diagnosis not present

## 2022-02-11 DIAGNOSIS — G4730 Sleep apnea, unspecified: Secondary | ICD-10-CM | POA: Diagnosis not present

## 2022-02-11 DIAGNOSIS — Z01810 Encounter for preprocedural cardiovascular examination: Secondary | ICD-10-CM | POA: Diagnosis not present

## 2022-02-11 DIAGNOSIS — Z7982 Long term (current) use of aspirin: Secondary | ICD-10-CM

## 2022-02-11 DIAGNOSIS — Z85828 Personal history of other malignant neoplasm of skin: Secondary | ICD-10-CM

## 2022-02-11 HISTORY — PX: LEFT HEART CATH AND CORONARY ANGIOGRAPHY: CATH118249

## 2022-02-11 LAB — POCT I-STAT, CHEM 8
BUN: 13 mg/dL (ref 8–23)
Calcium, Ion: 1.17 mmol/L (ref 1.15–1.40)
Chloride: 103 mmol/L (ref 98–111)
Creatinine, Ser: 0.9 mg/dL (ref 0.44–1.00)
Glucose, Bld: 142 mg/dL — ABNORMAL HIGH (ref 70–99)
HCT: 36 % (ref 36.0–46.0)
Hemoglobin: 12.2 g/dL (ref 12.0–15.0)
Potassium: 3.9 mmol/L (ref 3.5–5.1)
Sodium: 138 mmol/L (ref 135–145)
TCO2: 24 mmol/L (ref 22–32)

## 2022-02-11 LAB — CBC
HCT: 35.7 % — ABNORMAL LOW (ref 36.0–46.0)
Hemoglobin: 12.6 g/dL (ref 12.0–15.0)
MCH: 32 pg (ref 26.0–34.0)
MCHC: 35.3 g/dL (ref 30.0–36.0)
MCV: 90.6 fL (ref 80.0–100.0)
Platelets: 278 10*3/uL (ref 150–400)
RBC: 3.94 MIL/uL (ref 3.87–5.11)
RDW: 12.1 % (ref 11.5–15.5)
WBC: 8.5 10*3/uL (ref 4.0–10.5)
nRBC: 0 % (ref 0.0–0.2)

## 2022-02-11 LAB — BASIC METABOLIC PANEL
Anion gap: 9 (ref 5–15)
BUN: 12 mg/dL (ref 8–23)
CO2: 23 mmol/L (ref 22–32)
Calcium: 8.7 mg/dL — ABNORMAL LOW (ref 8.9–10.3)
Chloride: 106 mmol/L (ref 98–111)
Creatinine, Ser: 0.89 mg/dL (ref 0.44–1.00)
GFR, Estimated: >60 mL/min (ref >60)
Glucose, Bld: 138 mg/dL — ABNORMAL HIGH (ref 70–99)
Potassium: 3.9 mmol/L (ref 3.5–5.1)
Sodium: 138 mmol/L (ref 135–145)

## 2022-02-11 SURGERY — LEFT HEART CATH AND CORONARY ANGIOGRAPHY
Anesthesia: LOCAL

## 2022-02-11 MED ORDER — HEPARIN (PORCINE) IN NACL 1000-0.9 UT/500ML-% IV SOLN
INTRAVENOUS | Status: DC | PRN
  Administered 2022-02-11 (×2): 500 mL

## 2022-02-11 MED ORDER — LIDOCAINE HCL (PF) 1 % IJ SOLN
INTRAMUSCULAR | Status: AC
  Filled 2022-02-11: qty 30

## 2022-02-11 MED ORDER — SODIUM CHLORIDE 0.9 % IV SOLN: 250.0000 mL | INTRAVENOUS | Status: DC | PRN

## 2022-02-11 MED ORDER — ATORVASTATIN CALCIUM 80 MG PO TABS
80.0000 mg | ORAL_TABLET | Freq: Every day | ORAL | Status: DC
  Administered 2022-02-11 – 2022-02-18 (×7): 80 mg via ORAL
  Filled 2022-02-11 (×7): qty 1

## 2022-02-11 MED ORDER — HEPARIN SODIUM (PORCINE) 1000 UNIT/ML IJ SOLN
INTRAMUSCULAR | Status: AC
  Filled 2022-02-11: qty 10

## 2022-02-11 MED ORDER — FENTANYL CITRATE (PF) 100 MCG/2ML IJ SOLN
INTRAMUSCULAR | Status: DC | PRN
  Administered 2022-02-11: 25 ug via INTRAVENOUS

## 2022-02-11 MED ORDER — SODIUM CHLORIDE 0.9% FLUSH: 3.0000 mL | INTRAVENOUS | Status: DC | PRN

## 2022-02-11 MED ORDER — HYDRALAZINE HCL 20 MG/ML IJ SOLN: 10.0000 mg | INTRAMUSCULAR | Status: AC | PRN

## 2022-02-11 MED ORDER — VERAPAMIL HCL 2.5 MG/ML IV SOLN
INTRAVENOUS | Status: AC
  Filled 2022-02-11: qty 2

## 2022-02-11 MED ORDER — IOHEXOL 350 MG/ML SOLN
INTRAVENOUS | Status: DC | PRN
  Administered 2022-02-11: 45 mL

## 2022-02-11 MED ORDER — SODIUM CHLORIDE 0.9% FLUSH: 3.0000 mL | Freq: Two times a day (BID) | INTRAVENOUS | Status: DC

## 2022-02-11 MED ORDER — FENTANYL CITRATE (PF) 100 MCG/2ML IJ SOLN
INTRAMUSCULAR | Status: AC
  Filled 2022-02-11: qty 2

## 2022-02-11 MED ORDER — SODIUM CHLORIDE 0.9% FLUSH
3.0000 mL | Freq: Two times a day (BID) | INTRAVENOUS | Status: DC
  Administered 2022-02-12: 3 mL via INTRAVENOUS

## 2022-02-11 MED ORDER — MIDAZOLAM HCL 2 MG/2ML IJ SOLN
INTRAMUSCULAR | Status: AC
  Filled 2022-02-11: qty 2

## 2022-02-11 MED ORDER — LIDOCAINE HCL (PF) 1 % IJ SOLN
INTRAMUSCULAR | Status: DC | PRN
  Administered 2022-02-11: 2 mL

## 2022-02-11 MED ORDER — SODIUM CHLORIDE 0.9 % WEIGHT BASED INFUSION: 3.0000 mL/kg/h | INTRAVENOUS | Status: DC

## 2022-02-11 MED ORDER — LABETALOL HCL 5 MG/ML IV SOLN
10.0000 mg | INTRAVENOUS | Status: AC | PRN
  Administered 2022-02-11: 10 mg via INTRAVENOUS
  Filled 2022-02-11: qty 4

## 2022-02-11 MED ORDER — HEPARIN SODIUM (PORCINE) 1000 UNIT/ML IJ SOLN
INTRAMUSCULAR | Status: DC | PRN
  Administered 2022-02-11: 5000 [IU] via INTRAVENOUS

## 2022-02-11 MED ORDER — HEPARIN (PORCINE) IN NACL 1000-0.9 UT/500ML-% IV SOLN
INTRAVENOUS | Status: AC
  Filled 2022-02-11: qty 1000

## 2022-02-11 MED ORDER — ONDANSETRON HCL 4 MG/2ML IJ SOLN
4.0000 mg | Freq: Four times a day (QID) | INTRAMUSCULAR | Status: DC | PRN
  Administered 2022-02-12: 4 mg via INTRAVENOUS
  Filled 2022-02-11: qty 2

## 2022-02-11 MED ORDER — HEPARIN (PORCINE) 25000 UT/250ML-% IV SOLN
1300.0000 [IU]/h | INTRAVENOUS | Status: DC
  Administered 2022-02-11: 1100 [IU]/h via INTRAVENOUS
  Administered 2022-02-12: 1300 [IU]/h via INTRAVENOUS
  Filled 2022-02-11 (×2): qty 250

## 2022-02-11 MED ORDER — ASPIRIN 81 MG PO CHEW
81.0000 mg | CHEWABLE_TABLET | Freq: Every day | ORAL | Status: DC
  Administered 2022-02-12: 81 mg via ORAL
  Filled 2022-02-11 (×2): qty 1

## 2022-02-11 MED ORDER — MIDAZOLAM HCL 2 MG/2ML IJ SOLN
INTRAMUSCULAR | Status: DC | PRN
  Administered 2022-02-11: 1 mg via INTRAVENOUS

## 2022-02-11 MED ORDER — SODIUM CHLORIDE 0.9 % IV SOLN: INTRAVENOUS | Status: AC

## 2022-02-11 MED ORDER — SODIUM CHLORIDE 0.9 % WEIGHT BASED INFUSION: 1.0000 mL/kg/h | INTRAVENOUS | Status: DC

## 2022-02-11 MED ORDER — ACETAMINOPHEN 325 MG PO TABS: 650.0000 mg | ORAL_TABLET | ORAL | Status: DC | PRN

## 2022-02-11 MED ORDER — ASPIRIN 81 MG PO CHEW
81.0000 mg | CHEWABLE_TABLET | ORAL | Status: AC
  Administered 2022-02-11: 81 mg via ORAL
  Filled 2022-02-11: qty 1

## 2022-02-11 MED ORDER — VERAPAMIL HCL 2.5 MG/ML IV SOLN
INTRAVENOUS | Status: DC | PRN
  Administered 2022-02-11: 10 mL via INTRA_ARTERIAL

## 2022-02-11 SURGICAL SUPPLY — 10 items
BAND CMPR LRG ZPHR (HEMOSTASIS) ×1
BAND ZEPHYR COMPRESS 30 LONG (HEMOSTASIS) ×1 IMPLANT
CATH 5FR JL3.5 JR4 ANG PIG MP (CATHETERS) ×1 IMPLANT
GLIDESHEATH SLEND SS 6F .021 (SHEATH) ×1 IMPLANT
GUIDEWIRE INQWIRE 1.5J.035X260 (WIRE) IMPLANT
INQWIRE 1.5J .035X260CM (WIRE) ×2
KIT HEART LEFT (KITS) ×2 IMPLANT
PACK CARDIAC CATHETERIZATION (CUSTOM PROCEDURE TRAY) ×2 IMPLANT
TRANSDUCER W/STOPCOCK (MISCELLANEOUS) ×2 IMPLANT
TUBING CIL FLEX 10 FLL-RA (TUBING) ×2 IMPLANT

## 2022-02-11 NOTE — ED Provider Notes (Signed)
[facility] EMERGENCY DEPARTMENT Provider Note   CSN: [id number] Arrival date & time: 02/11/22  1445     History  Chief Complaint  Patient presents with   Chest Pain    Teresa Marquez is a 81 y.o. female.  Patient sent over from cardiology office.  They have concerns on her EKG which shows inverted T waves.  They are planning to take her to heart catheterization.  Patient has had intermittent discomfort in the chest for several months worse with exertion.  May be a little more constant here of late.  Patient nontoxic no acute distress.  Patient's past medical history is negative for any known coronary artery disease.  But she does have high cholesterol.  The discomfort is more of a tightness as the patient describes it and it is kind of over the anterior part of the chest left and right.       Home Medications Prior to Admission medications   Medication Sig Start Date End Date Taking? Authorizing Provider  aspirin 81 MG chewable tablet Chew daily by mouth.    [provider]  fluticasone (FLONASE) 50 MCG/ACT nasal spray Place as needed into both nostrils for allergies or rhinitis.    [provider]  folic acid (FOLVITE) 654 MCG tablet Take 400 mcg daily by mouth.    [provider]  Multiple Vitamins-Minerals (HAIR SKIN & NAILS ADVANCED PO) Take by mouth.    [provider]  NUTRITIONAL SUPPLEMENTS PO Take by mouth.    [provider]  omeprazole (PRILOSEC) 20 MG capsule Take 10 mg by mouth as needed.    [provider]  Turmeric 500 MG CAPS Take by mouth.    [provider]  vitamin B-12 (CYANOCOBALAMIN) 1000 MCG tablet Take 1,000 mcg daily by mouth.    [provider]  vitamin C (ASCORBIC ACID) 500 MG tablet Take 500 mg by mouth as needed. Per patient taking in Winter, Spring, and Fall months.    [provider]      Allergies    Bactrim [sulfamethoxazole-trimethoprim],  Keflet [cephalexin], and Tetracyclines & related    Review of Systems   Review of Systems  Constitutional:  Negative for chills and fever.  HENT:  Negative for ear pain and sore throat.   Eyes:  Negative for pain and visual disturbance.  Respiratory:  Negative for cough and shortness of breath.   Cardiovascular:  Positive for chest pain. Negative for palpitations and leg swelling.  Gastrointestinal:  Negative for abdominal pain and vomiting.  Genitourinary:  Negative for dysuria and hematuria.  Musculoskeletal:  Negative for arthralgias and back pain.  Skin:  Negative for color change and rash.  Neurological:  Negative for seizures and syncope.  All other systems reviewed and are negative.   Physical Exam Updated Vital Signs BP (!) 186/85   Pulse 90   Temp 97.8 F (36.6 C) (Oral)   Resp 14   Ht 1.626 m ('5\' 4"'$ )   Wt 86.9 kg   SpO2 98%   BMI 32.88 kg/m  Physical Exam Vitals and nursing note reviewed.  Constitutional:      General: She is not in acute distress.    Appearance: She is well-developed.  HENT:     Head: Normocephalic and atraumatic.  Eyes:     Conjunctiva/sclera: Conjunctivae normal.  Cardiovascular:     Rate and Rhythm: Normal rate and regular rhythm.     Heart sounds: No murmur heard. Pulmonary:  Effort: Pulmonary effort is normal. No respiratory distress.     Breath sounds: Normal breath sounds. No decreased breath sounds, wheezing, rhonchi or rales.  Chest:     Chest wall: No tenderness.  Abdominal:     Palpations: Abdomen is soft.     Tenderness: There is no abdominal tenderness.  Musculoskeletal:        General: No swelling.     Cervical back: Neck supple.     Right lower leg: No edema.     Left lower leg: No edema.  Skin:    General: Skin is warm and dry.     Capillary Refill: Capillary refill takes less than 2 seconds.  Neurological:     Mental Status: She is alert.  Psychiatric:        Mood and Affect: Mood normal.     ED Results /  Procedures / Treatments   Labs (all labs ordered are listed, but only abnormal results are displayed) Labs Reviewed  CBC  BASIC METABOLIC PANEL    EKG EKG Interpretation  Date/Time:  Monday February 11 2022 14:55:20 EDT Ventricular Rate:  93 PR Interval:  170 QRS Duration: 96 QT Interval:  393 QTC Calculation: 489 R Axis:   -65 Text Interpretation: Sinus rhythm Inferior infarct, old Probable anterior infarct, age indeterminate Confirmed by Fredia Sorrow [phone]) on 02/11/2022 2:58:36 PM  Radiology No results found.  Procedures Procedures    Medications Ordered in ED Medications  sodium chloride flush (NS) 0.9 % injection 3 mL (has no administration in time range)  sodium chloride flush (NS) 0.9 % injection 3 mL (has no administration in time range)  0.9 %  sodium chloride infusion (has no administration in time range)  aspirin chewable tablet 81 mg (has no administration in time range)  0.9% sodium chloride infusion (has no administration in time range)    Followed by  0.9% sodium chloride infusion (has no administration in time range)    ED Course/ Medical Decision Making/ A&P                           Medical Decision Making  Patient sent here for evaluation for this chest discomfort along with EKG changes that show inverted T waves.  The patient sent to the ED for holding for admission but now there is an opening in the Cath Lab so cardiology wants to take her directly up to the Cath Lab.  Patient screened by me.  Patient good to go to the Cath Lab.  CRITICAL CARE Performed by: Fredia Sorrow Total critical care time: 35 minutes Critical care time was exclusive of separately billable procedures and treating other patients. Critical care was necessary to treat or prevent imminent or life-threatening deterioration. Critical care was time spent personally by me on the following activities: development of treatment plan with patient and/or surrogate as well as nursing,  discussions with consultants, evaluation of patient's response to treatment, examination of patient, obtaining history from patient or surrogate, ordering and performing treatments and interventions, ordering and review of laboratory studies, ordering and review of radiographic studies, pulse oximetry and re-evaluation of patient's condition.  Final Clinical Impression(s) / ED Diagnoses Final diagnoses:  Precordial pain  Unstable angina [facility])    Rx / DC Orders ED Discharge Orders     None         Fredia Sorrow, MD 02/11/22 1505

## 2022-02-11 NOTE — Progress Notes (Incomplete)
Progress Note  Patient Name: Teresa Marquez Date of Encounter: 02/12/2022  [facility] Cardiologist: Freada Bergeron, MD   Subjective   Patient is doing well this AM. Denies chest pain or pain near radial cath site.   Inpatient Medications    Scheduled Meds:  aspirin  81 mg Oral Daily   atorvastatin  80 mg Oral Daily   carvedilol  3.125 mg Oral BID WC   sodium chloride flush  3 mL Intravenous Q12H   Continuous Infusions:  sodium chloride     heparin 1,100 Units/hr (02/11/22 2232)   PRN Meds: sodium chloride, acetaminophen, ondansetron (ZOFRAN) IV, sodium chloride flush   Vital Signs    Vitals:   02/12/22 0117 02/12/22 0150 02/12/22 0500 02/12/22 0741  BP: (!) 147/59  (!) 160/65 128/62  Pulse: 72 65 60 70  Resp: '18 16 16   '$ Temp: 97.7 F (36.5 C)   98 F (36.7 C)  TempSrc: Oral   Oral  SpO2: 96% 97% 99% 98%  Weight:      Height:        Intake/Output Summary (Last 24 hours) at 02/12/2022 0915 Last data filed at 02/12/2022 0700 Gross per 24 hour  Intake 334.64 ml  Output --  Net 334.64 ml      02/11/2022    5:04 PM 02/11/2022    3:01 PM 02/11/2022    1:35 PM  Last 3 Weights  Weight (lbs) 186 lb 12.8 oz 191 lb 9.3 oz 188 lb 12.8 oz  Weight (kg) 84.732 kg 86.9 kg 85.639 kg      Telemetry    Sinus rhythm, HR in the 70s-80 - Personally Reviewed  ECG     No new tracings- Personally Reviewed  Physical Exam   GEN: No acute distress. Resting comfortably in the bed  Neck: No JVD Cardiac: RRR, grade 2/6 systolic murmur at upper sternal border. Right radial cath site is soft, nontender, no bleeding or redness noted  Respiratory: Clear to auscultation bilaterally. GI: Soft, nontender, non-distended  MS: No edema; No deformity. Neuro:  Nonfocal  Psych: Normal affect   Labs    High Sensitivity Troponin:  No results for input(s): "TROPONINIHS" in the last 720 hours.   Chemistry Recent Labs  Lab 02/11/22 1608 02/11/22 1609  NA 138 138  K 3.9 3.9   CL 106 103  CO2 23  --   GLUCOSE 138* 142*  BUN 12 13  CREATININE 0.89 0.90  CALCIUM 8.7*  --   GFRNONAA >60  --   ANIONGAP 9  --     Lipids No results for input(s): "CHOL", "TRIG", "HDL", "LABVLDL", "LDLCALC", "CHOLHDL" in the last 168 hours.  Hematology Recent Labs  Lab 02/11/22 1608 02/11/22 1609 02/12/22 0808  WBC 8.5  --  7.2  RBC 3.94  --  3.98  HGB 12.6 12.2 12.5  HCT 35.7* 36.0 37.2  MCV 90.6  --  93.5  MCH 32.0  --  31.4  MCHC 35.3  --  33.6  RDW 12.1  --  12.3  PLT 278  --  262   Thyroid No results for input(s): "TSH", "FREET4" in the last 168 hours.  BNPNo results for input(s): "BNP", "PROBNP" in the last 168 hours.  DDimer No results for input(s): "DDIMER" in the last 168 hours.   Radiology    CARDIAC CATHETERIZATION  Result Date: 02/11/2022 1.  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  Patent left main with mild  distal left main stenosis 3.  Patent left circumflex with mild diffuse nonobstructive disease 4.  Patent RCA (large, dominant vessel) with mild diffuse plaquing but no areas of high-grade stenosis 5.  Mild hypokinesis of the distal anterior wall with preserved overall LVEF estimated at 55 to 65% Recommend: Resume heparin 2 hours after TR band off, TCTS consultation for consideration of CABG    Cardiac Studies   LHC 02/11/22: 1.  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  Patent left main with mild distal left main stenosis 3.  Patent left circumflex with mild diffuse nonobstructive disease 4.  Patent RCA (large, dominant vessel) with mild diffuse plaquing but no areas of high-grade stenosis 5.  Mild hypokinesis of the distal anterior wall with preserved overall LVEF estimated at 55 to 65%   Recommend: Resume heparin 2 hours after TR band off, TCTS consultation for consideration of CABG  Patient Profile     81 y.o. female with PMH of HTN, HLD, GERD, DM type II, NAFLD who initially presented to clinic for  chest pain found to have CTO of prox LAD on CTA planned for cath but presented to the clinic 8/7 with chest pain and ECG changes prompting urgent transfer to [facility] for emergent coronary angiography. Cath demonstrated subtotal ostial LAD disease now planned for CABG.  Assessment & Plan    Critical Ostial LAD Disease: - Patient with recently diagnosed LAD disease on coronary CTA that was obtained for exertional chest pain. She was planned for outpatient cath, but presented to clinic during pre-procedure visit with chest tightness found to have dynamic ECG changes concerning for ischemia.  - She was transferred to [facility] for urgent coronary angiography which revealed critical ostial LAD disease. Now planned for CABG. - CT surgery consulted for consideration for CABG-- getting pre CABG dopplers today  - Continue heparin gtt - Continue ASA '81mg'$  daily - Continue lipitor '80mg'$  daily - Start carvedilol 3.125 mg BID  - Nitro prn - Echocardiogram pending - Ordered lipid panel and A1c to further assess risk factors   HLD: -Continue lipitor '80mg'$  daily   For questions or updates, please contact [facility] Please consult [email or web address] for contact info under        Signed, Freada Bergeron, MD  02/12/2022, 9:15 AM     Patient seen and examined and agree with Vikki Ports, PA-C as detailed above.   In brief, the patient is a 81 year old female with history of HTN, HLD, GERD, DMII, and NAFLD who initially presented to clinic with chest pain found to have CTO of LAD on CCTA with plan for outpatient cath, but she developed recurrent chest pain at preprocedure clinic visit with anterior TWI on ECG prompting urgent transfer to [facility] for emergent coronary angiography. Cath demonstrated subtotal ostial LAD disease now awaiting CABG.  Currently, the patient is comfortable and chest pain free. Remains on heparin gtt. TTE on my review with LVEF 60-65%, normal RV, no significant valve disease, RAP 49mHg.    GEN: No acute distress.   Neck: No JVD Cardiac: RRR, no murmurs, rubs, or gallops.  Respiratory: Clear to auscultation bilaterally. GI: Soft, nontender, non-distended  MS: No edema; No deformity. Right radial access site c/d/i Neuro:  Nonfocal  Psych: Normal affect    Plan: -Cath with subtotal ostial LAD disease; now awaiting CABG -Continue heparin gtt -Continue ASA '81mg'$  daily -Continue lipitor '80mg'$  daily -Continue coreg 3.'125mg'$  BID -Will add ARB post-OR   HGwyndolyn Kaufman MD

## 2022-02-11 NOTE — ED Notes (Signed)
Cardiology at bedside and is preparing to accept pt in cath lab.

## 2022-02-11 NOTE — ED Triage Notes (Signed)
Pt BIB [facility] from the cardiologist office Pt was in for a follow up and they noted new T-wave inversion in the inferior leads. Pt was sent to ED so she can have a catheterization today. Pt reports she does have some slight chest discomfort at this time.

## 2022-02-11 NOTE — H&P (Signed)
Patient had coronary CTA last week that showed total occlusion of proximal to mid LAD. She was seen by Teresa Pancoast, NP, in the office today in order to set up cardiac catheterization. While in the office, she reported active chest pain and showed new T wave inversions in inferior leads and leads V1-V6. She was also seen by DOD (Dr, Teresa Marquez) and was then sent to the ED via EMS for urgent/emergent cardiac catheterization.   Please see full office visit note below which will serve at H&P.  Teresa Mclean, PA-C 02/11/2022 2:57 PM        Office Visit    Patient Name: Teresa Marquez Date of Encounter: 02/11/2022   Primary Care Provider:  Merrilee Seashore, MD Primary Cardiologist:  None Primary Electrophysiologist: None   Chief Complaint    Teresa Marquez is a 81 y.o. female with PMH of HTN, HLD, GERD, DM type II, NAFLD who presents today for follow-up of chest discomfort with exertion and to review coronary CTA.   Past Medical History        Past Medical History:  Diagnosis Date   Abdominal pain     Anxiety     Basal cell carcinoma     DDD (degenerative disc disease), lumbar     Diabetes mellitus without complication (HCC)     DM (diabetes mellitus) ([place])     Drug-induced myopathy     Elevated blood pressure reading in office without diagnosis of hypertension     GERD (gastroesophageal reflux disease)     Hyperlipidemia     Menopause     NAFLD (nonalcoholic fatty liver disease)     OSA (obstructive sleep apnea)           Past Surgical History:  Procedure Laterality Date   BREAST EXCISIONAL BIOPSY Left      benign   CARPAL TUNNEL RELEASE       FOOT SURGERY       NASAL SINUS SURGERY       pituitary adenoma resection       SHOULDER SURGERY       TOTAL HIP ARTHROPLASTY       TUBAL LIGATION          Allergies        Allergies  Allergen Reactions   Bactrim [Sulfamethoxazole-Trimethoprim]        Heart racing   Keflet [Cephalexin]     Tetracyclines &  Related        History of Present Illness    Teresa Marquez is a 81 year old female with the above-mentioned past medical history who presents today for follow-up of coronary CTA due to chest tightness.  Ms. Teresa Marquez was referred to Dr. Johney Marquez and was seen initially on 01/11/2022 for complaint of statin intolerance and chest discomfort.  Patient reported that she is able to exercise with some increased shortness of breath and chest discomfort when doing high resistant activities.  Discomfort does improve with belching but does not completely resolve and is not associated with dizziness, syncope, orthopnea.  Coronary CTA was ordered and completed that revealed total occlusion of proximal mid LAD with recommendation of cardiac catheterization for further evaluation.   Since last being seen in the office patient reports today that she is having chest pain across the chest similar to a panic attack. She was at the beach this weekend and felt a similar pain also down her arms at the beach. She denies palpitation, nausea, or dizziness. Dr. Quentin Marquez the  DOD evaluated the patient with me and suggested that she go the the ED for further evaluation.     Home Medications          Current Outpatient Medications  Medication Sig Dispense Refill   aspirin 81 MG chewable tablet Chew daily by mouth.       fluticasone (FLONASE) 50 MCG/ACT nasal spray Place as needed into both nostrils for allergies or rhinitis.       folic acid (FOLVITE) 244 MCG tablet Take 400 mcg daily by mouth.       Multiple Vitamins-Minerals (HAIR SKIN & NAILS ADVANCED PO) Take by mouth.       NUTRITIONAL SUPPLEMENTS PO Take by mouth.       omeprazole (PRILOSEC) 20 MG capsule Take 10 mg by mouth as needed.       Turmeric 500 MG CAPS Take by mouth.       vitamin B-12 (CYANOCOBALAMIN) 1000 MCG tablet Take 1,000 mcg daily by mouth.       vitamin C (ASCORBIC ACID) 500 MG tablet Take 500 mg by mouth as needed. Per patient taking in  Winter, Spring, and Fall months.        No current facility-administered medications for this visit.      Review of Systems  Please see the history of present illness.    (+)Chest pain   All other systems reviewed and are otherwise negative except as noted above.   Physical Exam       Wt Readings from Last 3 Encounters:  02/11/22 188 lb 12.8 oz (85.6 kg)  01/11/22 185 lb 6.4 oz (84.1 kg)  05/20/17 178 lb (80.7 kg)    VS:    Vitals:    02/11/22 1335  BP: (!) 150/80  Pulse: 90  ,Body mass index is 32.41 kg/m.   Constitutional:      Appearance: Healthy appearance. Not in distress.  Neck:     Vascular: JVD normal.  Pulmonary:     Effort: Pulmonary effort is normal.     Breath sounds: No wheezing. No rales. Diminished in the bases Cardiovascular:     Normal rate. Regular rhythm. Normal S1. Normal S2.      Murmurs: There is no murmur.  Edema:    Peripheral edema absent.  Abdominal:     Palpations: Abdomen is soft non tender. There is no hepatomegaly.  Skin:    General: Skin is warm and dry.  Neurological:     General: No focal deficit present.     Mental Status: Alert and oriented to person, place and time.     Cranial Nerves: Cranial nerves are intact.  EKG/LABS/Other Studies Reviewed    ECG personally reviewed by me today - Sinus Rhythm with possible anterior lateral infarct and TWI in leads II. III. AVF, V1-V6 Risk Assessment/Calculations:               Recent Labs       Lab Results  Component Value Date    WBC 7.7 09/14/2009    HGB 10.8 DELTA CHECK NOTED REPEATED TO VERIFY (L) 09/14/2009    HCT 31.2 (L) 09/14/2009    MCV 95.1 09/14/2009    PLT 161 09/14/2009      Recent Labs       Lab Results  Component Value Date    CREATININE 0.80 01/11/2022    BUN 11 01/11/2022    NA 133 (L) 01/11/2022    K 4.1 01/11/2022    CL 97  01/11/2022    CO2 23 01/11/2022      Recent Labs       Lab Results  Component Value Date    ALT 25 09/07/2009    AST 22  09/07/2009    ALKPHOS 67 09/07/2009    BILITOT 0.5 09/07/2009      Recent Labs  No results found for: "CHOL", "HDL", "LDLCALC", "LDLDIRECT", "TRIG", "CHOLHDL"    Recent Labs  No results found for: "HGBA1C"     Assessment & Plan    1.  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 -LHC to further evaluate abnormal cardiac CTA results     Disposition:   cation Adjustments/Labs and Tests Ordered: Current medicines are reviewed at length with the patient today.  Concerns regarding medicines are outlined above.    Signed, Mable Fill, Marissa Nestle, NP 02/11/2022, 2:21 PM [place]       ------------------     I have seen, examined the patient, and reviewed the above assessment and plan.     Patient with chest pain and new EKG findings concerning for ischemia.  She will present to [facility] via EMS for urgent/emergent left heart catheterization.  I discussed the plan with patient who is agreeable.  Hospital team aware.     Vickie Epley, MD 02/11/2022 2:28 PM

## 2022-02-11 NOTE — Interval H&P Note (Signed)
History and Physical Interval Note:  02/11/2022 3:55 PM  Teresa Marquez  has presented today for surgery, with the diagnosis of unstable angina.  The various methods of treatment have been discussed with the patient and family. After consideration of risks, benefits and other options for treatment, the patient has consented to  Procedure(s): LEFT HEART CATH AND CORONARY ANGIOGRAPHY (N/A) as a surgical intervention.  The patient's history has been reviewed, patient examined, no change in status, stable for surgery.  I have reviewed the patient's chart and labs.  Questions were answered to the patient's satisfaction.     Sherren Mocha

## 2022-02-11 NOTE — Progress Notes (Signed)
ANTICOAGULATION CONSULT NOTE - Initial Consult  Pharmacy Consult for heparin Indication: chest pain/ACS  Allergies  Allergen Reactions   Bactrim [Sulfamethoxazole-Trimethoprim]     Heart racing   Keflet [Cephalexin]     Makes me bleed   Tetracyclines & Related     Yeast infection     Patient Measurements: Height: '5\' 4"'$  (162.6 cm) Weight: 84.7 kg (186 lb 12.8 oz) IBW/kg (Calculated) : 54.7 Heparin Dosing Weight: 73kg  Vital Signs: Temp: 98.1 F (36.7 C) (08/07 1658) Temp Source: Oral (08/07 1658) BP: 156/69 (08/07 1845) Pulse Rate: 79 (08/07 1845)  Labs: Recent Labs    02/11/22 1608  HGB 12.6  HCT 35.7*  PLT 278  CREATININE 0.89    Estimated Creatinine Clearance: 53.1 mL/min (by C-G formula based on SCr of 0.89 mg/dL).   Medical History: Past Medical History:  Diagnosis Date   Abdominal pain    Anxiety    Basal cell carcinoma    DDD (degenerative disc disease), lumbar    Diabetes mellitus without complication (HCC)    DM (diabetes mellitus) (HCC)    Drug-induced myopathy    Elevated blood pressure reading in office without diagnosis of hypertension    GERD (gastroesophageal reflux disease)    Hyperlipidemia    Menopause    NAFLD (nonalcoholic fatty liver disease)    OSA (obstructive sleep apnea)     Medications:  Medications Prior to Admission  Medication Sig Dispense Refill Last Dose   acetaminophen (TYLENOL) 500 MG tablet Take 500 mg by mouth every 6 (six) hours as needed for moderate pain.   Past Week   aspirin 81 MG chewable tablet Chew 81 mg by mouth daily.   02/11/2022   ezetimibe (ZETIA) 10 MG tablet Take 10 mg by mouth daily.   02/10/2022   fluticasone (FLONASE) 50 MCG/ACT nasal spray Place 2 sprays into both nostrils every evening.   03/09/3299   folic acid (FOLVITE) 762 MCG tablet Take 400 mcg daily by mouth.   02/10/2022   Multiple Vitamins-Minerals (HAIR SKIN & NAILS ADVANCED PO) Take 1 tablet by mouth daily.   02/10/2022   omeprazole (PRILOSEC) 20  MG capsule Take 10 mg by mouth every evening.   02/10/2022   Polyvinyl Alcohol-Povidone (REFRESH OP) Place 1 drop into both eyes daily.   02/10/2022   Turmeric 500 MG CAPS Take 2 capsules by mouth daily.   02/10/2022   vitamin B-12 (CYANOCOBALAMIN) 1000 MCG tablet Take 1,000 mcg daily by mouth.   02/10/2022   Scheduled:   aspirin  81 mg Oral Daily   atorvastatin  80 mg Oral Daily   sodium chloride flush  3 mL Intravenous Q12H   Infusions:   sodium chloride 100 mL/hr at 02/11/22 1808   sodium chloride     heparin      Assessment: Pt was admitted for CP. She is now s/p cath>>significant CAD of LAD. Plan for CABG consult. Heparin has been ordered to start 2 hrs post TR band removal estimated at [place]. We will start without bolus.    Goal of Therapy:  Heparin level 0.3-0.7 units/ml Monitor platelets by anticoagulation protocol: Yes   Plan:  Start heparin at 1100 units/hr at 10 PM Check 8 hr HL in AM Daily HL and CBC F/u plan for CABG  Onnie Boer, PharmD, BCIDP, AAHIVP, CPP Infectious Disease Pharmacist 02/11/2022 7:05 PM

## 2022-02-12 ENCOUNTER — Encounter (HOSPITAL_COMMUNITY): Payer: Self-pay | Admitting: Cardiovascular Disease

## 2022-02-12 ENCOUNTER — Inpatient Hospital Stay (HOSPITAL_COMMUNITY): Payer: Medicare Other

## 2022-02-12 ENCOUNTER — Other Ambulatory Visit (HOSPITAL_COMMUNITY): Payer: Medicare Other

## 2022-02-12 DIAGNOSIS — I10 Essential (primary) hypertension: Secondary | ICD-10-CM | POA: Diagnosis not present

## 2022-02-12 DIAGNOSIS — I25110 Atherosclerotic heart disease of native coronary artery with unstable angina pectoris: Secondary | ICD-10-CM

## 2022-02-12 DIAGNOSIS — E782 Mixed hyperlipidemia: Secondary | ICD-10-CM

## 2022-02-12 DIAGNOSIS — I2510 Atherosclerotic heart disease of native coronary artery without angina pectoris: Secondary | ICD-10-CM

## 2022-02-12 DIAGNOSIS — Z01810 Encounter for preprocedural cardiovascular examination: Secondary | ICD-10-CM | POA: Diagnosis not present

## 2022-02-12 LAB — TYPE AND SCREEN
ABO/RH(D): A POS
Antibody Screen: NEGATIVE

## 2022-02-12 LAB — HEPARIN LEVEL (UNFRACTIONATED): Heparin Unfractionated: 0.24 IU/mL — ABNORMAL LOW (ref 0.30–0.70)

## 2022-02-12 LAB — BASIC METABOLIC PANEL
Anion gap: 11 (ref 5–15)
BUN: 14 mg/dL (ref 8–23)
CO2: 22 mmol/L (ref 22–32)
Calcium: 8.8 mg/dL — ABNORMAL LOW (ref 8.9–10.3)
Chloride: 104 mmol/L (ref 98–111)
Creatinine, Ser: 0.85 mg/dL (ref 0.44–1.00)
GFR, Estimated: >60 mL/min (ref >60)
Glucose, Bld: 167 mg/dL — ABNORMAL HIGH (ref 70–99)
Potassium: 4.2 mmol/L (ref 3.5–5.1)
Sodium: 137 mmol/L (ref 135–145)

## 2022-02-12 LAB — HEMOGLOBIN A1C
Hgb A1c MFr Bld: 7 % — ABNORMAL HIGH (ref 4.8–5.6)
Mean Plasma Glucose: 154.2 mg/dL

## 2022-02-12 LAB — ECHOCARDIOGRAM COMPLETE
AR max vel: 2.42 cm2
AV Peak grad: 16.8 mmHg
Ao pk vel: 2.05 m/s
Height: 64 in
P 1/2 time: 96 msec
S' Lateral: 3.3 cm
Weight: 2988.8 oz

## 2022-02-12 LAB — LIPID PANEL
Cholesterol: 174 mg/dL (ref 0–200)
HDL: 36 mg/dL — ABNORMAL LOW (ref >40)
LDL Cholesterol: 106 mg/dL — ABNORMAL HIGH (ref 0–99)
Total CHOL/HDL Ratio: 4.8 RATIO
Triglycerides: 160 mg/dL — ABNORMAL HIGH (ref <150)
VLDL: 32 mg/dL (ref 0–40)

## 2022-02-12 LAB — URINALYSIS, COMPLETE (UACMP) WITH MICROSCOPIC
Bacteria, UA: NONE SEEN
Bilirubin Urine: NEGATIVE
Glucose, UA: NEGATIVE mg/dL
Hgb urine dipstick: NEGATIVE
Ketones, ur: NEGATIVE mg/dL
Nitrite: NEGATIVE
Protein, ur: NEGATIVE mg/dL
Specific Gravity, Urine: 1.008 (ref 1.005–1.030)
pH: 7 (ref 5.0–8.0)

## 2022-02-12 LAB — CBC
HCT: 37.2 % (ref 36.0–46.0)
Hemoglobin: 12.5 g/dL (ref 12.0–15.0)
MCH: 31.4 pg (ref 26.0–34.0)
MCHC: 33.6 g/dL (ref 30.0–36.0)
MCV: 93.5 fL (ref 80.0–100.0)
Platelets: 262 10*3/uL (ref 150–400)
RBC: 3.98 MIL/uL (ref 3.87–5.11)
RDW: 12.3 % (ref 11.5–15.5)
WBC: 7.2 10*3/uL (ref 4.0–10.5)
nRBC: 0 % (ref 0.0–0.2)

## 2022-02-12 LAB — SARS CORONAVIRUS 2 BY RT PCR: SARS Coronavirus 2 by RT PCR: NEGATIVE

## 2022-02-12 LAB — PROTIME-INR
INR: 1.1 (ref 0.8–1.2)
Prothrombin Time: 13.8 seconds (ref 11.4–15.2)

## 2022-02-12 LAB — APTT: aPTT: 70 seconds — ABNORMAL HIGH (ref 24–36)

## 2022-02-12 LAB — SURGICAL PCR SCREEN
MRSA, PCR: NEGATIVE
Staphylococcus aureus: NEGATIVE

## 2022-02-12 MED ORDER — CHLORHEXIDINE GLUCONATE 0.12 % MT SOLN
15.0000 mL | Freq: Once | OROMUCOSAL | Status: AC
  Administered 2022-02-13: 15 mL via OROMUCOSAL
  Filled 2022-02-12: qty 15

## 2022-02-12 MED ORDER — MELATONIN 3 MG PO TABS
3.0000 mg | ORAL_TABLET | Freq: Once | ORAL | Status: AC
Start: 2022-02-12 — End: 2022-02-12
  Administered 2022-02-12: 3 mg via ORAL
  Filled 2022-02-12: qty 1

## 2022-02-12 MED ORDER — DIAZEPAM 2 MG PO TABS
2.0000 mg | ORAL_TABLET | Freq: Once | ORAL | Status: AC
Start: 2022-02-13 — End: 2022-02-13
  Administered 2022-02-13: 2 mg via ORAL
  Filled 2022-02-12: qty 1

## 2022-02-12 MED ORDER — PHENYLEPHRINE HCL-NACL 20-0.9 MG/250ML-% IV SOLN
30.0000 ug/min | INTRAVENOUS | Status: AC
  Administered 2022-02-13: 25 ug/min via INTRAVENOUS
  Filled 2022-02-12: qty 250

## 2022-02-12 MED ORDER — NITROGLYCERIN IN D5W 200-5 MCG/ML-% IV SOLN
2.0000 ug/min | INTRAVENOUS | Status: DC
  Filled 2022-02-12: qty 250

## 2022-02-12 MED ORDER — TRANEXAMIC ACID (OHS) PUMP PRIME SOLUTION
2.0000 mg/kg | INTRAVENOUS | Status: DC
  Filled 2022-02-12: qty 1.69

## 2022-02-12 MED ORDER — POTASSIUM CHLORIDE 2 MEQ/ML IV SOLN
80.0000 meq | INTRAVENOUS | Status: DC
  Filled 2022-02-12: qty 40

## 2022-02-12 MED ORDER — CHLORHEXIDINE GLUCONATE CLOTH 2 % EX PADS
6.0000 | MEDICATED_PAD | Freq: Once | CUTANEOUS | Status: AC
  Administered 2022-02-13: 6 via TOPICAL

## 2022-02-12 MED ORDER — PLASMA-LYTE A IV SOLN
INTRAVENOUS | Status: DC
  Filled 2022-02-12: qty 2.5

## 2022-02-12 MED ORDER — INSULIN REGULAR(HUMAN) IN NACL 100-0.9 UT/100ML-% IV SOLN
INTRAVENOUS | Status: AC
  Administered 2022-02-13: 5 [IU]/h via INTRAVENOUS
  Filled 2022-02-12: qty 100

## 2022-02-12 MED ORDER — CEFAZOLIN SODIUM-DEXTROSE 2-4 GM/100ML-% IV SOLN
2.0000 g | INTRAVENOUS | Status: AC
  Administered 2022-02-13: 2 g via INTRAVENOUS
  Filled 2022-02-12: qty 100

## 2022-02-12 MED ORDER — VANCOMYCIN HCL 1500 MG/300ML IV SOLN
1500.0000 mg | INTRAVENOUS | Status: AC
  Administered 2022-02-13: 1500 mg via INTRAVENOUS
  Filled 2022-02-12: qty 300

## 2022-02-12 MED ORDER — CARVEDILOL 3.125 MG PO TABS
3.1250 mg | ORAL_TABLET | Freq: Two times a day (BID) | ORAL | Status: DC
  Administered 2022-02-12: 3.125 mg via ORAL
  Filled 2022-02-12: qty 1

## 2022-02-12 MED ORDER — MANNITOL 20 % IV SOLN
INTRAVENOUS | Status: DC
  Filled 2022-02-12: qty 13

## 2022-02-12 MED ORDER — MILRINONE LACTATE IN DEXTROSE 20-5 MG/100ML-% IV SOLN
0.3000 ug/kg/min | INTRAVENOUS | Status: DC
  Filled 2022-02-12: qty 100

## 2022-02-12 MED ORDER — METOPROLOL TARTRATE 12.5 MG HALF TABLET
12.5000 mg | ORAL_TABLET | Freq: Once | ORAL | Status: AC
  Administered 2022-02-13: 12.5 mg via ORAL
  Filled 2022-02-12: qty 1

## 2022-02-12 MED ORDER — CHLORHEXIDINE GLUCONATE CLOTH 2 % EX PADS
6.0000 | MEDICATED_PAD | Freq: Once | CUTANEOUS | Status: AC
Start: 2022-02-12 — End: 2022-02-12
  Administered 2022-02-12: 6 via TOPICAL

## 2022-02-12 MED ORDER — TRANEXAMIC ACID 1000 MG/10ML IV SOLN
1.5000 mg/kg/h | INTRAVENOUS | Status: AC
  Administered 2022-02-13: 1.5 mg/kg/h via INTRAVENOUS
  Filled 2022-02-12: qty 25

## 2022-02-12 MED ORDER — BISACODYL 5 MG PO TBEC
5.0000 mg | DELAYED_RELEASE_TABLET | Freq: Once | ORAL | Status: AC
Start: 2022-02-12 — End: 2022-02-12
  Administered 2022-02-12: 5 mg via ORAL
  Filled 2022-02-12: qty 1

## 2022-02-12 MED ORDER — EPINEPHRINE HCL 5 MG/250ML IV SOLN IN NS
0.0000 ug/min | INTRAVENOUS | Status: DC
  Filled 2022-02-12: qty 250

## 2022-02-12 MED ORDER — DEXMEDETOMIDINE HCL IN NACL 400 MCG/100ML IV SOLN
0.1000 ug/kg/h | INTRAVENOUS | Status: AC
  Administered 2022-02-13: .5 ug/kg/h via INTRAVENOUS
  Filled 2022-02-12: qty 100

## 2022-02-12 MED ORDER — TRANEXAMIC ACID (OHS) BOLUS VIA INFUSION
15.0000 mg/kg | INTRAVENOUS | Status: AC
  Administered 2022-02-13: 1270.5 mg via INTRAVENOUS
  Filled 2022-02-12: qty 1271

## 2022-02-12 MED ORDER — HEPARIN 30,000 UNITS/1000 ML (OHS) CELLSAVER SOLUTION
Status: DC
  Filled 2022-02-12: qty 1000

## 2022-02-12 MED ORDER — NOREPINEPHRINE 4 MG/250ML-% IV SOLN
0.0000 ug/min | INTRAVENOUS | Status: DC
  Filled 2022-02-12: qty 250

## 2022-02-12 NOTE — Progress Notes (Signed)
Pre-CABG study completed.  ° °Please see CV Proc for preliminary results.  ° °Karilyn Wind, RDMS, RVT ° °

## 2022-02-12 NOTE — Care Management (Signed)
  Transition of Care [facility]) Screening Note   Patient Details  Name: Teresa Marquez Date of Birth: [birth year]   Transition of Care [facility]) CM/SW Contact:    Bethena Roys, RN Phone Number: 02/12/2022, 1:02 PM    Transition of Care Department [facility]) has reviewed the patient and no TOC needs have been identified at this time. Case Manager will continue to monitor patient advancement through interdisciplinary progression rounds. If new patient transition needs arise, please place a TOC consult.

## 2022-02-12 NOTE — Progress Notes (Signed)
CARDIAC REHAB PHASE I    Pre op OHS teaching including OHS care guide, OHS booklet, IS use, pain management, sternal precautions, post op home needs, restrictions, ambulation, and OHS video information. All questions and concerns addressed. Plan for surgery tomorrow. Was able to get to 800 with IS demonstration. Encouraged pt to practice IS  today. Will continue to follow.   1100-1200  Vanessa Barbara, RN BSN 02/12/2022 11:58 AM

## 2022-02-12 NOTE — Progress Notes (Signed)
ANTICOAGULATION CONSULT NOTE  Pharmacy Consult for heparin Indication: chest pain/ACS  Allergies  Allergen Reactions   Bactrim [Sulfamethoxazole-Trimethoprim]     Heart racing   Keflet [Cephalexin]     Makes me bleed   Tetracyclines & Related     Yeast infection     Patient Measurements: Height: '5\' 4"'$  (162.6 cm) Weight: 84.7 kg (186 lb 12.8 oz) IBW/kg (Calculated) : 54.7 Heparin Dosing Weight: 73kg  Vital Signs: Temp: 98 F (36.7 C) (08/08 0741) Temp Source: Oral (08/08 0741) BP: 128/62 (08/08 0741) Pulse Rate: 70 (08/08 0741)  Labs: Recent Labs    02/11/22 1608 02/11/22 1609 02/12/22 0808 02/12/22 0858  HGB 12.6 12.2 12.5  --   HCT 35.7* 36.0 37.2  --   PLT 278  --  262  --   HEPARINUNFRC  --   --  0.24*  --   CREATININE 0.89 0.90  --  0.85     Estimated Creatinine Clearance: 55.6 mL/min (by C-G formula based on SCr of 0.85 mg/dL).   Medical History: Past Medical History:  Diagnosis Date   Abdominal pain    Anxiety    Basal cell carcinoma    DDD (degenerative disc disease), lumbar    Diabetes mellitus without complication (HCC)    DM (diabetes mellitus) (HCC)    Drug-induced myopathy    Elevated blood pressure reading in office without diagnosis of hypertension    GERD (gastroesophageal reflux disease)    Hyperlipidemia    Menopause    NAFLD (nonalcoholic fatty liver disease)    OSA (obstructive sleep apnea)     Medications:  Medications Prior to Admission  Medication Sig Dispense Refill Last Dose   acetaminophen (TYLENOL) 500 MG tablet Take 500 mg by mouth every 6 (six) hours as needed for moderate pain.   Past Week   aspirin 81 MG chewable tablet Chew 81 mg by mouth daily.   02/11/2022   ezetimibe (ZETIA) 10 MG tablet Take 10 mg by mouth daily.   02/10/2022   fluticasone (FLONASE) 50 MCG/ACT nasal spray Place 2 sprays into both nostrils every evening.   12/09/8754   folic acid (FOLVITE) 433 MCG tablet Take 400 mcg daily by mouth.   02/10/2022    Multiple Vitamins-Minerals (HAIR SKIN & NAILS ADVANCED PO) Take 1 tablet by mouth daily.   02/10/2022   omeprazole (PRILOSEC) 20 MG capsule Take 10 mg by mouth every evening.   02/10/2022   Polyvinyl Alcohol-Povidone (REFRESH OP) Place 1 drop into both eyes daily.   02/10/2022   Turmeric 500 MG CAPS Take 2 capsules by mouth daily.   02/10/2022   vitamin B-12 (CYANOCOBALAMIN) 1000 MCG tablet Take 1,000 mcg daily by mouth.   02/10/2022   Scheduled:   aspirin  81 mg Oral Daily   atorvastatin  80 mg Oral Daily   carvedilol  3.125 mg Oral BID WC   sodium chloride flush  3 mL Intravenous Q12H   Infusions:   sodium chloride     heparin 1,100 Units/hr (02/11/22 2232)    Assessment: Pt was admitted for CP. She is now s/p cath>>significant CAD of LAD. Plan for CABG consult, heparin resumed post-cath.  Heparin level subtherapeutic at 0.24, CBC stable.   Goal of Therapy:  Heparin level 0.3-0.7 units/ml Monitor platelets by anticoagulation protocol: Yes   Plan:  Increase heparin to 1300 units/h Daily heparin level and CBC  Arrie Senate, PharmD, BCPS, [facility] Clinical Pharmacist [phone] Please check AMION for all [place] numbers  02/12/2022    

## 2022-02-12 NOTE — Consult Note (Signed)
Reason for Consult: Severe ostial LAD disease Referring Physician: Dr. Sampson Goon EMSLEE Marquez is an 81 y.o. female.  HPI: Teresa Marquez is an 81 year old woman who presented with a chief complaint of chest pain.  Teresa Marquez is an 81 year old woman with a past medical history significant for hypertension, hyperlipidemia, type 2 diabetes without complication, reflux, fatty liver disease, obesity, and obstructive sleep apnea.  Over the past couple of months she has been having increased shortness of breath and chest discomfort with activity.  She has had some relief with belching at times.  Occasionally will radiate down her arms.  She was referred to Dr. Johney Frame.  A coronary CT suggested possible total occlusion of the proximal to mid LAD.  She was seen in the cardiology office yesterday and was complaining of chest pain.  She was admitted for further evaluation.  She underwent cardiac catheterization which showed a tight ostial LAD stenosis not amenable to percutaneous intervention.  She was started on IV heparin and admitted.  She has been pain-free since admission.  Past Medical History:  Diagnosis Date   Abdominal pain    Anxiety    Basal cell carcinoma    DDD (degenerative disc disease), lumbar    Diabetes mellitus without complication (HCC)    DM (diabetes mellitus) ([place])    Drug-induced myopathy    Elevated blood pressure reading in office without diagnosis of hypertension    GERD (gastroesophageal reflux disease)    Hyperlipidemia    Menopause    NAFLD (nonalcoholic fatty liver disease)    OSA (obstructive sleep apnea)     Past Surgical History:  Procedure Laterality Date   BREAST EXCISIONAL BIOPSY Left    benign   CARPAL TUNNEL RELEASE     FOOT SURGERY     LEFT HEART CATH AND CORONARY ANGIOGRAPHY N/A 02/11/2022   Procedure: LEFT HEART CATH AND CORONARY ANGIOGRAPHY;  Surgeon: Sherren Mocha, MD;  Location: [facility];  Service: Cardiovascular;   Laterality: N/A;   NASAL SINUS SURGERY     pituitary adenoma resection     SHOULDER SURGERY     TOTAL HIP ARTHROPLASTY     TUBAL LIGATION      Family History  Problem Relation Age of Onset   Cancer Mother    Cancer Father     Social History:  reports that she has never smoked. She has never used smokeless tobacco. She reports current alcohol use. She reports that she does not use drugs.  Allergies:  Allergies  Allergen Reactions   Bactrim [Sulfamethoxazole-Trimethoprim]     Heart racing   Keflet [Cephalexin]     Makes me bleed   Tetracyclines & Related     Yeast infection     Medications: Scheduled:  aspirin  81 mg Oral Daily   atorvastatin  80 mg Oral Daily   carvedilol  3.125 mg Oral BID WC   [START ON 02/13/2022] chlorhexidine  15 mL Mouth/Throat Once   Chlorhexidine Gluconate Cloth  6 each Topical Once   And   [START ON 02/13/2022] Chlorhexidine Gluconate Cloth  6 each Topical Once   [START ON 02/13/2022] diazepam  2 mg Oral Once   [START ON 02/13/2022] epinephrine  0-10 mcg/min Intravenous To OR   [START ON 02/13/2022] heparin sodium (porcine) 2,500 Units, papaverine 30 mg in electrolyte-A (PLASMALYTE-A PH 7.4) 500 mL irrigation   Irrigation To OR   [START ON 02/13/2022] insulin   Intravenous To OR   [START ON 02/13/2022] Burgess Amor  Blood Cardioplegia vial (lidocaine/magnesium/mannitol 0.26g-4g-6.4g)   Intracoronary To OR   [START ON 02/13/2022] metoprolol tartrate  12.5 mg Oral Once   [START ON 02/13/2022] phenylephrine  30-200 mcg/min Intravenous To OR   [START ON 02/13/2022] potassium chloride  80 mEq Other To OR   sodium chloride flush  3 mL Intravenous Q12H   [START ON 02/13/2022] tranexamic acid  15 mg/kg Intravenous To OR   [START ON 02/13/2022] tranexamic acid  2 mg/kg Intracatheter To OR    Results for orders placed or performed during the hospital encounter of 02/11/22 (from the past 48 hour(s))  CBC     Status: Abnormal   Collection Time: 02/11/22  4:08 PM  Result Value Ref  Range   WBC 8.5 4.0 - 10.5 K/uL   RBC 3.94 3.87 - 5.11 MIL/uL   Hemoglobin 12.6 12.0 - 15.0 g/dL   HCT 35.7 (L) 36.0 - 46.0 %   MCV 90.6 80.0 - 100.0 fL   MCH 32.0 26.0 - 34.0 pg   MCHC 35.3 30.0 - 36.0 g/dL   RDW 12.1 11.5 - 15.5 %   Platelets 278 150 - 400 K/uL   nRBC 0.0 0.0 - 0.2 %    Comment: Performed at [facility], [place] [address] [place], [place] [address]  Basic metabolic panel     Status: Abnormal   Collection Time: 02/11/22  4:08 PM  Result Value Ref Range   Sodium 138 135 - 145 mmol/L   Potassium 3.9 3.5 - 5.1 mmol/L   Chloride 106 98 - 111 mmol/L   CO2 23 22 - 32 mmol/L   Glucose, Bld 138 (H) 70 - 99 mg/dL    Comment: Glucose reference range applies only to samples taken after fasting for at least 8 hours.   BUN 12 8 - 23 mg/dL   Creatinine, Ser 0.89 0.44 - 1.00 mg/dL   Calcium 8.7 (L) 8.9 - 10.3 mg/dL   GFR, Estimated >60 >60 mL/min    Comment: (NOTE) Calculated using the CKD-EPI Creatinine Equation (2021)    Anion gap 9 5 - 15    Comment: Performed at [facility] [address] [place], [place] [address]  I-STAT, [place] [address]     Status: Abnormal   Collection Time: 02/11/22  4:09 PM  Result Value Ref Range   Sodium 138 135 - 145 mmol/L   Potassium 3.9 3.5 - 5.1 mmol/L   Chloride 103 98 - 111 mmol/L   BUN 13 8 - 23 mg/dL   Creatinine, Ser 0.90 0.44 - 1.00 mg/dL   Glucose, Bld 142 (H) 70 - 99 mg/dL    Comment: Glucose reference range applies only to samples taken after fasting for at least 8 hours.   Calcium, Ion 1.17 1.15 - 1.40 mmol/L   TCO2 24 22 - 32 mmol/L   Hemoglobin 12.2 12.0 - 15.0 g/dL   HCT 36.0 36.0 - 46.0 %  Heparin level (unfractionated)     Status: Abnormal   Collection Time: 02/12/22  8:08 AM  Result Value Ref Range   Heparin Unfractionated 0.24 (L) 0.30 - 0.70 IU/mL    Comment: (NOTE) The clinical reportable range upper limit is being lowered to >1.10 to align with the FDA approved guidance for the current  laboratory assay.  If heparin results are below expected values, and patient dosage has  been confirmed, suggest follow up testing of antithrombin III levels. Performed at [facility], [place],  [place] [address]   CBC     Status: None   Collection Time: 02/12/22  8:08 AM  Result Value Ref Range   WBC 7.2 4.0 - 10.5 K/uL   RBC 3.98 3.87 - 5.11 MIL/uL   Hemoglobin 12.5 12.0 - 15.0 g/dL   HCT 37.2 36.0 - 46.0 %   MCV 93.5 80.0 - 100.0 fL   MCH 31.4 26.0 - 34.0 pg   MCHC 33.6 30.0 - 36.0 g/dL   RDW 12.3 11.5 - 15.5 %   Platelets 262 150 - 400 K/uL   nRBC 0.0 0.0 - 0.2 %    Comment: Performed at [facility], [place] [address] [place], [place] [address]  Hemoglobin A1c     Status: Abnormal   Collection Time: 02/12/22  8:08 AM  Result Value Ref Range   Hgb A1c MFr Bld 7.0 (H) 4.8 - 5.6 %    Comment: (NOTE) Pre diabetes:          5.7%-6.4%  Diabetes:              >6.4%  Glycemic control for   <7.0% adults with diabetes    Mean Plasma Glucose 154.2 mg/dL    Comment: Performed at [facility] [address] [place], [place] [address]  Lipid panel     Status: Abnormal   Collection Time: 02/12/22  8:08 AM  Result Value Ref Range   Cholesterol 174 0 - 200 mg/dL   Triglycerides 160 (H) <150 mg/dL   HDL 36 (L) >40 mg/dL   Total CHOL/HDL Ratio 4.8 RATIO   VLDL 32 0 - 40 mg/dL   LDL Cholesterol 106 (H) 0 - 99 mg/dL    Comment:        Total Cholesterol/HDL:CHD Risk Coronary Heart Disease Risk Table                     Men   Women  1/2 Average Risk   3.4   3.3  Average Risk       5.0   4.4  2 X Average Risk   9.6   7.1  3 X Average Risk  23.4   11.0        Use the calculated Patient Ratio above and the CHD Risk Table to determine the patient's CHD Risk.        ATP III CLASSIFICATION (LDL):  <100     mg/dL   Optimal  100-129  mg/dL   Near or Above                    Optimal  130-159  mg/dL   Borderline  160-189  mg/dL   High  >190      mg/dL   Very High Performed at [facility] [address], [place],  [address]   Basic metabolic panel     Status: Abnormal   Collection Time: 02/12/22  8:58 AM  Result Value Ref Range   Sodium 137 135 - 145 mmol/L   Potassium 4.2 3.5 - 5.1 mmol/L   Chloride 104 98 - 111 mmol/L   CO2 22 22 - 32 mmol/L   Glucose, Bld 167 (H) 70 - 99 mg/dL    Comment: Glucose reference range applies only to samples taken after fasting for at least 8 hours.   BUN 14 8 - 23 mg/dL   Creatinine, Ser 0.85 0.44 - 1.00 mg/dL   Calcium 8.8 (L) 8.9 -  10.3 mg/dL   GFR, Estimated >60 >60 mL/min    Comment: (NOTE) Calculated using the CKD-EPI Creatinine Equation (2021)    Anion gap 11 5 - 15    Comment: Performed at [facility], [place] [address] [place], [place] [address]    ECHOCARDIOGRAM COMPLETE  Result Date: 02/12/2022    ECHOCARDIOGRAM REPORT   Patient Name:   Teresa Marquez [facility] Date of Exam: 02/12/2022 Medical Rec #:  [id number]            Height: Accession #:    [id number]           Weight: Date of Birth:  July 13, 1940             BSA: Patient Age:    37 years             BP:           128/62 mmHg Patient Gender: F                    HR:           68 bpm. Exam Location:  Inpatient Procedure: 2D Echo, Cardiac Doppler and Color Doppler Indications:    Coronary artery disease  History:        Patient has no prior history of Echocardiogram examinations.                 Risk Factors:Diabetes, Dyslipidemia and Sleep Apnea.  Sonographer:    Jefferey Pica Referring Phys: [place]  1. There is a very small area of apicolateral hypokinesis. Left ventricular ejection fraction, by estimation, is 60 to 65%. The left ventricle has normal function. The left ventricle demonstrates regional wall motion abnormalities (see scoring diagram/findings for description). There is mild concentric left ventricular hypertrophy. Left ventricular diastolic parameters are consistent with Grade I  diastolic dysfunction (impaired relaxation). Elevated left atrial pressure.  2. Right ventricular systolic function is normal. The right ventricular size is normal. Tricuspid regurgitation signal is inadequate for assessing PA pressure.  3. The mitral valve is normal in structure. No evidence of mitral valve regurgitation. No evidence of mitral stenosis.  4. The aortic valve is normal in structure. Aortic valve regurgitation is not visualized. No aortic stenosis is present.  5. The inferior vena cava is dilated in size with >50% respiratory variability, suggesting right atrial pressure of 8 mmHg. FINDINGS  Left Ventricle: There is a very small area of apicolateral hypokinesis. Left ventricular ejection fraction, by estimation, is 60 to 65%. The left ventricle has normal function. The left ventricle demonstrates regional wall motion abnormalities. The left  ventricular internal cavity size was normal in size. There is mild concentric left ventricular hypertrophy. Left ventricular diastolic parameters are consistent with Grade I diastolic dysfunction (impaired relaxation). Elevated left atrial pressure.  LV Wall Scoring: The apical lateral segment is hypokinetic. Right Ventricle: The right ventricular size is normal. No increase in right ventricular wall thickness. Right ventricular systolic function is normal. Tricuspid regurgitation signal is inadequate for assessing PA pressure. Left Atrium: Left atrial size was normal in size. Right Atrium: Right atrial size was normal in size. Pericardium: Trivial pericardial effusion is present. The pericardial effusion is circumferential. Mitral Valve: The mitral valve is normal in structure. No evidence of mitral valve regurgitation. No evidence of mitral valve stenosis. Tricuspid Valve: The tricuspid valve is normal in structure. Tricuspid valve regurgitation is not demonstrated. No evidence of tricuspid stenosis. Aortic Valve: The aortic  valve is normal in structure. Aortic  valve regurgitation is not visualized. No aortic stenosis is present. Aortic valve peak gradient measures 16.8 mmHg. Pulmonic Valve: The pulmonic valve was normal in structure. Pulmonic valve regurgitation is not visualized. No evidence of pulmonic stenosis. Aorta: The aortic root is normal in size and structure. Venous: The inferior vena cava is dilated in size with greater than 50% respiratory variability, suggesting right atrial pressure of 8 mmHg. IAS/Shunts: No atrial level shunt detected by color flow Doppler.  LEFT VENTRICLE PLAX 2D LVIDd:         4.50 cm   Diastology LVIDs:         3.30 cm   LV e' medial:  4.50 cm/s LV PW:         1.30 cm   LV e' lateral: 3.90 cm/s LV IVS:        1.30 cm LVOT diam:     2.10 cm LV SV:         11198 LVOT Area:     3.46 cm  IVC IVC diam: 2.10 cm LEFT ATRIUM LA diam:      3.40 cm LA Vol (A2C): 48.1 ml LA Vol (A4C): 41.7 ml  AORTIC VALVE                 PULMONIC VALVE AV Area (Vmax): 2.42 cm     PV Vmax:       0.72 m/s AV Vmax:        205.00 cm/s  PV Peak grad:  2.1 mmHg AV Peak Grad:   16.8 mmHg LVOT Vmax:      143.00 cm/s LVOT Vmean:     92.000 cm/s LVOT VTI:       32.330 m MITRAL VALVE MV PHT:      96.00 msec SHUNTS                         Systemic VTI:  32.33 m                         Systemic Diam: 2.10 cm Dani Gobble Croitoru MD Electronically signed by Sanda Klein MD Signature Date/Time: 02/12/2022/11:35:50 AM    Final    VAS US DOPPLER PRE CABG  Result Date: 02/12/2022 PREOPERATIVE VASCULAR EVALUATION Patient Name:  Teresa Marquez [facility]  Date of Exam:   02/12/2022 Medical Rec #: [id number]             Accession #:    [id number] Date of Birth: 05/28/1941              Patient Gender: F Patient Age:   58 years Exam Location:  [facility] Procedure:      VAS US DOPPLER PRE CABG Referring Phys: Remo Lipps Landers Prajapati --------------------------------------------------------------------------------  Indications:  Pre-CABG. Risk Factors: Hyperlipidemia, Diabetes. Performing  Technologist: Darlin Coco RDMS RVT  Examination Guidelines: A complete evaluation includes B-mode imaging, spectral Doppler, color Doppler, and power Doppler as needed of all accessible portions of each vessel. Bilateral testing is considered an integral part of a complete examination. Limited examinations for reoccurring indications may be performed as noted.  Right Carotid Findings: +----------+--------+--------+--------+--------+------------------+           PSV cm/sEDV cm/sStenosisDescribeComments           +----------+--------+--------+--------+--------+------------------+ CCA Prox  108     19                                         +----------+--------+--------+--------+--------+------------------+  CCA Distal96      19                                         +----------+--------+--------+--------+--------+------------------+ ICA Prox  62      16                      intimal thickening +----------+--------+--------+--------+--------+------------------+ ICA Mid   71      19                                         +----------+--------+--------+--------+--------+------------------+ ICA Distal64      13                                         +----------+--------+--------+--------+--------+------------------+ ECA       117     6                                          +----------+--------+--------+--------+--------+------------------+ +----------+--------+-------+----------------+------------+           PSV cm/sEDV cmsDescribe        Arm Pressure +----------+--------+-------+----------------+------------+ [id number]            Multiphasic, WNL             +----------+--------+-------+----------------+------------+ +---------+--------+--+--------+-+---------+ VertebralPSV cm/s41EDV cm/s9Antegrade +---------+--------+--+--------+-+---------+ Left Carotid Findings: +----------+--------+--------+--------+--------+------------------+            PSV cm/sEDV cm/sStenosisDescribeComments           +----------+--------+--------+--------+--------+------------------+ CCA Prox  104     14                                         +----------+--------+--------+--------+--------+------------------+ CCA Distal85      17                                         +----------+--------+--------+--------+--------+------------------+ ICA Prox  74      20                      intimal thickening +----------+--------+--------+--------+--------+------------------+ ICA Mid   79      21                                         +----------+--------+--------+--------+--------+------------------+ ICA Distal74      20                                         +----------+--------+--------+--------+--------+------------------+ ECA       124     9                                          +----------+--------+--------+--------+--------+------------------+ +----------+--------+--------+----------------+------------+  SubclavianPSV cm/sEDV cm/sDescribe        Arm Pressure +----------+--------+--------+----------------+------------+           159             Multiphasic, WNL             +----------+--------+--------+----------------+------------+ +---------+--------+--+--------+--+---------+ VertebralPSV cm/s48EDV cm/s10Antegrade +---------+--------+--+--------+--+---------+  ABI Findings: +--------+------------------+-----+---------+--------+ Right   Rt Pressure (mmHg)IndexWaveform Comment  +--------+------------------+-----+---------+--------+ [id number]                    triphasic         +--------+------------------+-----+---------+--------+ PTA                            triphasic         +--------+------------------+-----+---------+--------+ DP                             triphasic         +--------+------------------+-----+---------+--------+ +--------+------------------+-----+---------+-------+  Left    Lt Pressure (mmHg)IndexWaveform Comment +--------+------------------+-----+---------+-------+ [id number]                    triphasic        +--------+------------------+-----+---------+-------+ PTA                            triphasic        +--------+------------------+-----+---------+-------+ DP                             triphasic        +--------+------------------+-----+---------+-------+  Right Doppler Findings: +--------+--------+-----+---------+--------+ Site    PressureIndexDoppler  Comments +--------+--------+-----+---------+--------+ [id number]          triphasic         +--------+--------+-----+---------+--------+ Radial               triphasic         +--------+--------+-----+---------+--------+ Ulnar                triphasic         +--------+--------+-----+---------+--------+  Left Doppler Findings: +--------+--------+-----+---------+--------+ Site    PressureIndexDoppler  Comments +--------+--------+-----+---------+--------+ [id number]          triphasic         +--------+--------+-----+---------+--------+ Radial               triphasic         +--------+--------+-----+---------+--------+ Ulnar                triphasic         +--------+--------+-----+---------+--------+  Summary: Right Carotid: The extracranial vessels were near-normal with only minimal wall                thickening or plaque. Left Carotid: The extracranial vessels were near-normal with only minimal wall               thickening or plaque. Vertebrals:  Bilateral vertebral arteries demonstrate antegrade flow. Subclavians: Normal flow hemodynamics were seen in bilateral subclavian              arteries. Right Upper Extremity: Doppler waveforms remain within normal limits with right radial compression. Doppler waveforms remain within normal limits with right ulnar compression. Left Upper Extremity: Doppler waveforms remain within normal limits with  left radial compression. Doppler waveforms decrease 50% with left ulnar compression.    Preliminary  CARDIAC CATHETERIZATION  Result Date: 02/11/2022 1.  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  Patent left main with mild distal left main stenosis 3.  Patent left circumflex with mild diffuse nonobstructive disease 4.  Patent RCA (large, dominant vessel) with mild diffuse plaquing but no areas of high-grade stenosis 5.  Mild hypokinesis of the distal anterior wall with preserved overall LVEF estimated at 55 to 65% Recommend: Resume heparin 2 hours after TR band off, TCTS consultation for consideration of CABG    I personally reviewed the catheterization images.  There is a high-grade ostial LAD stenosis.  Mild plaque but no significant stenoses elsewhere.  Review of Systems  Constitutional:  Positive for activity change and fatigue. Negative for unexpected weight change.  Respiratory:  Positive for shortness of breath.   Cardiovascular:  Positive for chest pain.  Genitourinary:  Negative for difficulty urinating and dysuria.  Neurological:  Negative for dizziness, syncope and weakness.  Hematological:  Bruises/bleeds easily.   Blood pressure 128/62, pulse 70, temperature 98 F (36.7 C), temperature source Oral, resp. rate 16, height '5\' 4"'$  (1.626 m), weight 84.7 kg, SpO2 98 %. Physical Exam Vitals reviewed.  Constitutional:      General: She is not in acute distress.    Appearance: She is obese.  HENT:     Head: Normocephalic and atraumatic.  Eyes:     General: No scleral icterus.    Extraocular Movements: Extraocular movements intact.  Neck:     Vascular: No carotid bruit.  Cardiovascular:     Rate and Rhythm: Normal rate and regular rhythm.     Heart sounds: Normal heart sounds. No murmur heard.    No friction rub. No gallop.  Pulmonary:     Effort: No respiratory distress.     Breath sounds: Normal breath sounds. No wheezing or rales.  Abdominal:      General: There is no distension.     Palpations: Abdomen is soft.  Musculoskeletal:     Cervical back: Neck supple.  Lymphadenopathy:     Cervical: No cervical adenopathy.  Skin:    General: Skin is warm and dry.  Neurological:     General: No focal deficit present.     Mental Status: She is alert and oriented to person, place, and time.     Cranial Nerves: No cranial nerve deficit.     Motor: No weakness.     Assessment/Plan: Teresa Marquez is an 81 year old woman with a past medical history significant for hypertension, hyperlipidemia, type 2 diabetes without complication, reflux, fatty liver disease, obesity, and obstructive sleep apnea.  She presented with chest pain and was found to have a high-grade ostial LAD stenosis.  The stenosis is not amenable to percutaneous intervention.  I recommended to Teresa Marquez that we proceed with coronary bypass grafting x 1 with a left mammary to the LAD.  We have may be able to do this off-pump all of the LAD does appear to be a relatively small vessel.  I informed her of the general nature of the procedure including the incisions to be used, the need for general anesthesia, the possible use of cardiopulmonary bypass, the use of drainage tubes and temporary pacemaker wires postoperatively, the expected hospital stay, and the overall recovery.  I informed her of the indications, risks, benefits, and alternatives.  She understands the risks include, but are not limited to death, MI, stroke, DVT, PE, bleeding, possible need for transfusion, infection, cardiac arrhythmias, respiratory or  renal failure, as well as possibility of other unforeseeable complications.  She understands and accepts the risks and wishes to proceed.  Plan coronary bypass grafting x 1 02/13/2022  Melrose Nakayama 02/12/2022, 1:33 PM

## 2022-02-12 NOTE — H&P (View-Only) (Signed)
Reason for Consult: Severe ostial LAD disease Referring Physician: Dr. Sampson Goon Teresa Marquez is an 81 y.o. female.  HPI: Teresa Marquez is an 81 year old woman who presented with a chief complaint of chest pain.  Teresa Marquez is an 81 year old woman with a past medical history significant for hypertension, hyperlipidemia, type 2 diabetes without complication, reflux, fatty liver disease, obesity, and obstructive sleep apnea.  Over the past couple of months she has been having increased shortness of breath and chest discomfort with activity.  She has had some relief with belching at times.  Occasionally will radiate down her arms.  She was referred to Dr. Johney Frame.  A coronary CT suggested possible total occlusion of the proximal to mid LAD.  She was seen in the cardiology office yesterday and was complaining of chest pain.  She was admitted for further evaluation.  She underwent cardiac catheterization which showed a tight ostial LAD stenosis not amenable to percutaneous intervention.  She was started on IV heparin and admitted.  She has been pain-free since admission.  Past Medical History:  Diagnosis Date   Abdominal pain    Anxiety    Basal cell carcinoma    DDD (degenerative disc disease), lumbar    Diabetes mellitus without complication (HCC)    DM (diabetes mellitus) ([place])    Drug-induced myopathy    Elevated blood pressure reading in office without diagnosis of hypertension    GERD (gastroesophageal reflux disease)    Hyperlipidemia    Menopause    NAFLD (nonalcoholic fatty liver disease)    OSA (obstructive sleep apnea)     Past Surgical History:  Procedure Laterality Date   BREAST EXCISIONAL BIOPSY Left    benign   CARPAL TUNNEL RELEASE     FOOT SURGERY     LEFT HEART CATH AND CORONARY ANGIOGRAPHY N/A 02/11/2022   Procedure: LEFT HEART CATH AND CORONARY ANGIOGRAPHY;  Surgeon: Sherren Mocha, MD;  Location: [facility];  Service: Cardiovascular;   Laterality: N/A;   NASAL SINUS SURGERY     pituitary adenoma resection     SHOULDER SURGERY     TOTAL HIP ARTHROPLASTY     TUBAL LIGATION      Family History  Problem Relation Age of Onset   Cancer Mother    Cancer Father     Social History:  reports that she has never smoked. She has never used smokeless tobacco. She reports current alcohol use. She reports that she does not use drugs.  Allergies:  Allergies  Allergen Reactions   Bactrim [Sulfamethoxazole-Trimethoprim]     Heart racing   Keflet [Cephalexin]     Makes me bleed   Tetracyclines & Related     Yeast infection     Medications: Scheduled:  aspirin  81 mg Oral Daily   atorvastatin  80 mg Oral Daily   carvedilol  3.125 mg Oral BID WC   [START ON 02/13/2022] chlorhexidine  15 mL Mouth/Throat Once   Chlorhexidine Gluconate Cloth  6 each Topical Once   And   [START ON 02/13/2022] Chlorhexidine Gluconate Cloth  6 each Topical Once   [START ON 02/13/2022] diazepam  2 mg Oral Once   [START ON 02/13/2022] epinephrine  0-10 mcg/min Intravenous To OR   [START ON 02/13/2022] heparin sodium (porcine) 2,500 Units, papaverine 30 mg in electrolyte-A (PLASMALYTE-A PH 7.4) 500 mL irrigation   Irrigation To OR   [START ON 02/13/2022] insulin   Intravenous To OR   [START ON 02/13/2022] Burgess Amor  Blood Cardioplegia vial (lidocaine/magnesium/mannitol 0.26g-4g-6.4g)   Intracoronary To OR   [START ON 02/13/2022] metoprolol tartrate  12.5 mg Oral Once   [START ON 02/13/2022] phenylephrine  30-200 mcg/min Intravenous To OR   [START ON 02/13/2022] potassium chloride  80 mEq Other To OR   sodium chloride flush  3 mL Intravenous Q12H   [START ON 02/13/2022] tranexamic acid  15 mg/kg Intravenous To OR   [START ON 02/13/2022] tranexamic acid  2 mg/kg Intracatheter To OR    Results for orders placed or performed during the hospital encounter of 02/11/22 (from the past 48 hour(s))  CBC     Status: Abnormal   Collection Time: 02/11/22  4:08 PM  Result Value Ref  Range   WBC 8.5 4.0 - 10.5 K/uL   RBC 3.94 3.87 - 5.11 MIL/uL   Hemoglobin 12.6 12.0 - 15.0 g/dL   HCT 35.7 (L) 36.0 - 46.0 %   MCV 90.6 80.0 - 100.0 fL   MCH 32.0 26.0 - 34.0 pg   MCHC 35.3 30.0 - 36.0 g/dL   RDW 12.1 11.5 - 15.5 %   Platelets 278 150 - 400 K/uL   nRBC 0.0 0.0 - 0.2 %    Comment: Performed at [facility], [place] [address] [place], [place] [address]  Basic metabolic panel     Status: Abnormal   Collection Time: 02/11/22  4:08 PM  Result Value Ref Range   Sodium 138 135 - 145 mmol/L   Potassium 3.9 3.5 - 5.1 mmol/L   Chloride 106 98 - 111 mmol/L   CO2 23 22 - 32 mmol/L   Glucose, Bld 138 (H) 70 - 99 mg/dL    Comment: Glucose reference range applies only to samples taken after fasting for at least 8 hours.   BUN 12 8 - 23 mg/dL   Creatinine, Ser 0.89 0.44 - 1.00 mg/dL   Calcium 8.7 (L) 8.9 - 10.3 mg/dL   GFR, Estimated >60 >60 mL/min    Comment: (NOTE) Calculated using the CKD-EPI Creatinine Equation (2021)    Anion gap 9 5 - 15    Comment: Performed at [facility] [address], [place], [place] [address]  I-STAT, [place] [address]     Status: Abnormal   Collection Time: 02/11/22  4:09 PM  Result Value Ref Range   Sodium 138 135 - 145 mmol/L   Potassium 3.9 3.5 - 5.1 mmol/L   Chloride 103 98 - 111 mmol/L   BUN 13 8 - 23 mg/dL   Creatinine, Ser 0.90 0.44 - 1.00 mg/dL   Glucose, Bld 142 (H) 70 - 99 mg/dL    Comment: Glucose reference range applies only to samples taken after fasting for at least 8 hours.   Calcium, Ion 1.17 1.15 - 1.40 mmol/L   TCO2 24 22 - 32 mmol/L   Hemoglobin 12.2 12.0 - 15.0 g/dL   HCT 36.0 36.0 - 46.0 %  Heparin level (unfractionated)     Status: Abnormal   Collection Time: 02/12/22  8:08 AM  Result Value Ref Range   Heparin Unfractionated 0.24 (L) 0.30 - 0.70 IU/mL    Comment: (NOTE) The clinical reportable range upper limit is being lowered to >1.10 to align with the FDA approved guidance for the current  laboratory assay.  If heparin results are below expected values, and patient dosage has  been confirmed, suggest follow up testing of antithrombin III levels. Performed at [facility], [place],  [place] [address]   CBC     Status: None   Collection Time: 02/12/22  8:08 AM  Result Value Ref Range   WBC 7.2 4.0 - 10.5 K/uL   RBC 3.98 3.87 - 5.11 MIL/uL   Hemoglobin 12.5 12.0 - 15.0 g/dL   HCT 37.2 36.0 - 46.0 %   MCV 93.5 80.0 - 100.0 fL   MCH 31.4 26.0 - 34.0 pg   MCHC 33.6 30.0 - 36.0 g/dL   RDW 12.3 11.5 - 15.5 %   Platelets 262 150 - 400 K/uL   nRBC 0.0 0.0 - 0.2 %    Comment: Performed at [facility], [place] [address], [place], [place] [address]  Hemoglobin A1c     Status: Abnormal   Collection Time: 02/12/22  8:08 AM  Result Value Ref Range   Hgb A1c MFr Bld 7.0 (H) 4.8 - 5.6 %    Comment: (NOTE) Pre diabetes:          5.7%-6.4%  Diabetes:              >6.4%  Glycemic control for   <7.0% adults with diabetes    Mean Plasma Glucose 154.2 mg/dL    Comment: Performed at [facility] [address] [place], [place] [address]  Lipid panel     Status: Abnormal   Collection Time: 02/12/22  8:08 AM  Result Value Ref Range   Cholesterol 174 0 - 200 mg/dL   Triglycerides 160 (H) <150 mg/dL   HDL 36 (L) >40 mg/dL   Total CHOL/HDL Ratio 4.8 RATIO   VLDL 32 0 - 40 mg/dL   LDL Cholesterol 106 (H) 0 - 99 mg/dL    Comment:        Total Cholesterol/HDL:CHD Risk Coronary Heart Disease Risk Table                     Men   Women  1/2 Average Risk   3.4   3.3  Average Risk       5.0   4.4  2 X Average Risk   9.6   7.1  3 X Average Risk  23.4   11.0        Use the calculated Patient Ratio above and the CHD Risk Table to determine the patient's CHD Risk.        ATP III CLASSIFICATION (LDL):  <100     mg/dL   Optimal  100-129  mg/dL   Near or Above                    Optimal  130-159  mg/dL   Borderline  160-189  mg/dL   High  >190      mg/dL   Very High Performed at [facility] [address] [place], [place] [address]   Basic metabolic panel     Status: Abnormal   Collection Time: 02/12/22  8:58 AM  Result Value Ref Range   Sodium 137 135 - 145 mmol/L   Potassium 4.2 3.5 - 5.1 mmol/L   Chloride 104 98 - 111 mmol/L   CO2 22 22 - 32 mmol/L   Glucose, Bld 167 (H) 70 - 99 mg/dL    Comment: Glucose reference range applies only to samples taken after fasting for at least 8 hours.   BUN 14 8 - 23 mg/dL   Creatinine, Ser 0.85 0.44 - 1.00 mg/dL   Calcium 8.8 (L) 8.9 -  10.3 mg/dL   GFR, Estimated >60 >60 mL/min    Comment: (NOTE) Calculated using the CKD-EPI Creatinine Equation (2021)    Anion gap 11 5 - 15    Comment: Performed at [facility], [place] [address], [place], [place] [address]    ECHOCARDIOGRAM COMPLETE  Result Date: 02/12/2022    ECHOCARDIOGRAM REPORT   Patient Name:   Teresa Marquez [facility] Date of Exam: 02/12/2022 Medical Rec #:  [id number]            Height: Accession #:    [id number]           Weight: Date of Birth:  1941-06-21             BSA: Patient Age:    66 years             BP:           128/62 mmHg Patient Gender: F                    HR:           68 bpm. Exam Location:  Inpatient Procedure: 2D Echo, Cardiac Doppler and Color Doppler Indications:    Coronary artery disease  History:        Patient has no prior history of Echocardiogram examinations.                 Risk Factors:Diabetes, Dyslipidemia and Sleep Apnea.  Sonographer:    Jefferey Pica Referring Phys: [place]  1. There is a very small area of apicolateral hypokinesis. Left ventricular ejection fraction, by estimation, is 60 to 65%. The left ventricle has normal function. The left ventricle demonstrates regional wall motion abnormalities (see scoring diagram/findings for description). There is mild concentric left ventricular hypertrophy. Left ventricular diastolic parameters are consistent with Grade I  diastolic dysfunction (impaired relaxation). Elevated left atrial pressure.  2. Right ventricular systolic function is normal. The right ventricular size is normal. Tricuspid regurgitation signal is inadequate for assessing PA pressure.  3. The mitral valve is normal in structure. No evidence of mitral valve regurgitation. No evidence of mitral stenosis.  4. The aortic valve is normal in structure. Aortic valve regurgitation is not visualized. No aortic stenosis is present.  5. The inferior vena cava is dilated in size with >50% respiratory variability, suggesting right atrial pressure of 8 mmHg. FINDINGS  Left Ventricle: There is a very small area of apicolateral hypokinesis. Left ventricular ejection fraction, by estimation, is 60 to 65%. The left ventricle has normal function. The left ventricle demonstrates regional wall motion abnormalities. The left  ventricular internal cavity size was normal in size. There is mild concentric left ventricular hypertrophy. Left ventricular diastolic parameters are consistent with Grade I diastolic dysfunction (impaired relaxation). Elevated left atrial pressure.  LV Wall Scoring: The apical lateral segment is hypokinetic. Right Ventricle: The right ventricular size is normal. No increase in right ventricular wall thickness. Right ventricular systolic function is normal. Tricuspid regurgitation signal is inadequate for assessing PA pressure. Left Atrium: Left atrial size was normal in size. Right Atrium: Right atrial size was normal in size. Pericardium: Trivial pericardial effusion is present. The pericardial effusion is circumferential. Mitral Valve: The mitral valve is normal in structure. No evidence of mitral valve regurgitation. No evidence of mitral valve stenosis. Tricuspid Valve: The tricuspid valve is normal in structure. Tricuspid valve regurgitation is not demonstrated. No evidence of tricuspid stenosis. Aortic Valve: The aortic  valve is normal in structure. Aortic  valve regurgitation is not visualized. No aortic stenosis is present. Aortic valve peak gradient measures 16.8 mmHg. Pulmonic Valve: The pulmonic valve was normal in structure. Pulmonic valve regurgitation is not visualized. No evidence of pulmonic stenosis. Aorta: The aortic root is normal in size and structure. Venous: The inferior vena cava is dilated in size with greater than 50% respiratory variability, suggesting right atrial pressure of 8 mmHg. IAS/Shunts: No atrial level shunt detected by color flow Doppler.  LEFT VENTRICLE PLAX 2D LVIDd:         4.50 cm   Diastology LVIDs:         3.30 cm   LV e' medial:  4.50 cm/s LV PW:         1.30 cm   LV e' lateral: 3.90 cm/s LV IVS:        1.30 cm LVOT diam:     2.10 cm LV SV:         11198 LVOT Area:     3.46 cm  IVC IVC diam: 2.10 cm LEFT ATRIUM LA diam:      3.40 cm LA Vol (A2C): 48.1 ml LA Vol (A4C): 41.7 ml  AORTIC VALVE                 PULMONIC VALVE AV Area (Vmax): 2.42 cm     PV Vmax:       0.72 m/s AV Vmax:        205.00 cm/s  PV Peak grad:  2.1 mmHg AV Peak Grad:   16.8 mmHg LVOT Vmax:      143.00 cm/s LVOT Vmean:     92.000 cm/s LVOT VTI:       32.330 m MITRAL VALVE MV PHT:      96.00 msec SHUNTS                         Systemic VTI:  32.33 m                         Systemic Diam: 2.10 cm Dani Gobble Croitoru MD Electronically signed by Sanda Klein MD Signature Date/Time: 02/12/2022/11:35:50 AM    Final    VAS US DOPPLER PRE CABG  Result Date: 02/12/2022 PREOPERATIVE VASCULAR EVALUATION Patient Name:  Teresa Marquez [facility]  Date of Exam:   02/12/2022 Medical Rec #: [id number]             Accession #:    [id number] Date of Birth: 08/12/1940              Patient Gender: F Patient Age:   13 years Exam Location:  [facility] Procedure:      VAS US DOPPLER PRE CABG Referring Phys: Remo Lipps Seleny Allbright --------------------------------------------------------------------------------  Indications:  Pre-CABG. Risk Factors: Hyperlipidemia, Diabetes. Performing  Technologist: Darlin Coco RDMS RVT  Examination Guidelines: A complete evaluation includes B-mode imaging, spectral Doppler, color Doppler, and power Doppler as needed of all accessible portions of each vessel. Bilateral testing is considered an integral part of a complete examination. Limited examinations for reoccurring indications may be performed as noted.  Right Carotid Findings: +----------+--------+--------+--------+--------+------------------+           PSV cm/sEDV cm/sStenosisDescribeComments           +----------+--------+--------+--------+--------+------------------+ CCA Prox  108     19                                         +----------+--------+--------+--------+--------+------------------+  CCA Distal96      19                                         +----------+--------+--------+--------+--------+------------------+ ICA Prox  62      16                      intimal thickening +----------+--------+--------+--------+--------+------------------+ ICA Mid   71      19                                         +----------+--------+--------+--------+--------+------------------+ ICA Distal64      13                                         +----------+--------+--------+--------+--------+------------------+ ECA       117     6                                          +----------+--------+--------+--------+--------+------------------+ +----------+--------+-------+----------------+------------+           PSV cm/sEDV cmsDescribe        Arm Pressure +----------+--------+-------+----------------+------------+ [id number]            Multiphasic, WNL             +----------+--------+-------+----------------+------------+ +---------+--------+--+--------+-+---------+ VertebralPSV cm/s41EDV cm/s9Antegrade +---------+--------+--+--------+-+---------+ Left Carotid Findings: +----------+--------+--------+--------+--------+------------------+            PSV cm/sEDV cm/sStenosisDescribeComments           +----------+--------+--------+--------+--------+------------------+ CCA Prox  104     14                                         +----------+--------+--------+--------+--------+------------------+ CCA Distal85      17                                         +----------+--------+--------+--------+--------+------------------+ ICA Prox  74      20                      intimal thickening +----------+--------+--------+--------+--------+------------------+ ICA Mid   79      21                                         +----------+--------+--------+--------+--------+------------------+ ICA Distal74      20                                         +----------+--------+--------+--------+--------+------------------+ ECA       124     9                                          +----------+--------+--------+--------+--------+------------------+ +----------+--------+--------+----------------+------------+  SubclavianPSV cm/sEDV cm/sDescribe        Arm Pressure +----------+--------+--------+----------------+------------+           159             Multiphasic, WNL             +----------+--------+--------+----------------+------------+ +---------+--------+--+--------+--+---------+ VertebralPSV cm/s48EDV cm/s10Antegrade +---------+--------+--+--------+--+---------+  ABI Findings: +--------+------------------+-----+---------+--------+ Right   Rt Pressure (mmHg)IndexWaveform Comment  +--------+------------------+-----+---------+--------+ [id number]                    triphasic         +--------+------------------+-----+---------+--------+ PTA                            triphasic         +--------+------------------+-----+---------+--------+ DP                             triphasic         +--------+------------------+-----+---------+--------+ +--------+------------------+-----+---------+-------+  Left    Lt Pressure (mmHg)IndexWaveform Comment +--------+------------------+-----+---------+-------+ [id number]                    triphasic        +--------+------------------+-----+---------+-------+ PTA                            triphasic        +--------+------------------+-----+---------+-------+ DP                             triphasic        +--------+------------------+-----+---------+-------+  Right Doppler Findings: +--------+--------+-----+---------+--------+ Site    PressureIndexDoppler  Comments +--------+--------+-----+---------+--------+ [id number]          triphasic         +--------+--------+-----+---------+--------+ Radial               triphasic         +--------+--------+-----+---------+--------+ Ulnar                triphasic         +--------+--------+-----+---------+--------+  Left Doppler Findings: +--------+--------+-----+---------+--------+ Site    PressureIndexDoppler  Comments +--------+--------+-----+---------+--------+ [id number]          triphasic         +--------+--------+-----+---------+--------+ Radial               triphasic         +--------+--------+-----+---------+--------+ Ulnar                triphasic         +--------+--------+-----+---------+--------+  Summary: Right Carotid: The extracranial vessels were near-normal with only minimal wall                thickening or plaque. Left Carotid: The extracranial vessels were near-normal with only minimal wall               thickening or plaque. Vertebrals:  Bilateral vertebral arteries demonstrate antegrade flow. Subclavians: Normal flow hemodynamics were seen in bilateral subclavian              arteries. Right Upper Extremity: Doppler waveforms remain within normal limits with right radial compression. Doppler waveforms remain within normal limits with right ulnar compression. Left Upper Extremity: Doppler waveforms remain within normal limits with  left radial compression. Doppler waveforms decrease 50% with left ulnar compression.    Preliminary  CARDIAC CATHETERIZATION  Result Date: 02/11/2022 1.  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  Patent left main with mild distal left main stenosis 3.  Patent left circumflex with mild diffuse nonobstructive disease 4.  Patent RCA (large, dominant vessel) with mild diffuse plaquing but no areas of high-grade stenosis 5.  Mild hypokinesis of the distal anterior wall with preserved overall LVEF estimated at 55 to 65% Recommend: Resume heparin 2 hours after TR band off, TCTS consultation for consideration of CABG    I personally reviewed the catheterization images.  There is a high-grade ostial LAD stenosis.  Mild plaque but no significant stenoses elsewhere.  Review of Systems  Constitutional:  Positive for activity change and fatigue. Negative for unexpected weight change.  Respiratory:  Positive for shortness of breath.   Cardiovascular:  Positive for chest pain.  Genitourinary:  Negative for difficulty urinating and dysuria.  Neurological:  Negative for dizziness, syncope and weakness.  Hematological:  Bruises/bleeds easily.   Blood pressure 128/62, pulse 70, temperature 98 F (36.7 C), temperature source Oral, resp. rate 16, height '5\' 4"'$  (1.626 m), weight 84.7 kg, SpO2 98 %. Physical Exam Vitals reviewed.  Constitutional:      General: She is not in acute distress.    Appearance: She is obese.  HENT:     Head: Normocephalic and atraumatic.  Eyes:     General: No scleral icterus.    Extraocular Movements: Extraocular movements intact.  Neck:     Vascular: No carotid bruit.  Cardiovascular:     Rate and Rhythm: Normal rate and regular rhythm.     Heart sounds: Normal heart sounds. No murmur heard.    No friction rub. No gallop.  Pulmonary:     Effort: No respiratory distress.     Breath sounds: Normal breath sounds. No wheezing or rales.  Abdominal:      General: There is no distension.     Palpations: Abdomen is soft.  Musculoskeletal:     Cervical back: Neck supple.  Lymphadenopathy:     Cervical: No cervical adenopathy.  Skin:    General: Skin is warm and dry.  Neurological:     General: No focal deficit present.     Mental Status: She is alert and oriented to person, place, and time.     Cranial Nerves: No cranial nerve deficit.     Motor: No weakness.     Assessment/Plan: Teresa Marquez is an 81 year old woman with a past medical history significant for hypertension, hyperlipidemia, type 2 diabetes without complication, reflux, fatty liver disease, obesity, and obstructive sleep apnea.  She presented with chest pain and was found to have a high-grade ostial LAD stenosis.  The stenosis is not amenable to percutaneous intervention.  I recommended to Teresa Marquez that we proceed with coronary bypass grafting x 1 with a left mammary to the LAD.  We have may be able to do this off-pump all of the LAD does appear to be a relatively small vessel.  I informed her of the general nature of the procedure including the incisions to be used, the need for general anesthesia, the possible use of cardiopulmonary bypass, the use of drainage tubes and temporary pacemaker wires postoperatively, the expected hospital stay, and the overall recovery.  I informed her of the indications, risks, benefits, and alternatives.  She understands the risks include, but are not limited to death, MI, stroke, DVT, PE, bleeding, possible need for transfusion, infection, cardiac arrhythmias, respiratory or  renal failure, as well as possibility of other unforeseeable complications.  She understands and accepts the risks and wishes to proceed.  Plan coronary bypass grafting x 1 02/13/2022  Melrose Nakayama 02/12/2022, 1:33 PM

## 2022-02-13 ENCOUNTER — Inpatient Hospital Stay (HOSPITAL_COMMUNITY)
Admission: EM | Disposition: A | Discharge status: Home Health | Payer: Self-pay | Source: Ambulatory Visit | Attending: Thoracic Surgery (Cardiothoracic Vascular Surgery)

## 2022-02-13 ENCOUNTER — Inpatient Hospital Stay (HOSPITAL_COMMUNITY): Payer: Medicare Other | Admitting: Anesthesiology

## 2022-02-13 ENCOUNTER — Other Ambulatory Visit: Payer: Self-pay

## 2022-02-13 ENCOUNTER — Inpatient Hospital Stay (HOSPITAL_COMMUNITY): Payer: Medicare Other

## 2022-02-13 ENCOUNTER — Encounter (HOSPITAL_COMMUNITY): Payer: Self-pay | Admitting: Cardiovascular Disease

## 2022-02-13 DIAGNOSIS — E119 Type 2 diabetes mellitus without complications: Secondary | ICD-10-CM | POA: Diagnosis not present

## 2022-02-13 DIAGNOSIS — G4730 Sleep apnea, unspecified: Secondary | ICD-10-CM

## 2022-02-13 DIAGNOSIS — I25110 Atherosclerotic heart disease of native coronary artery with unstable angina pectoris: Secondary | ICD-10-CM

## 2022-02-13 DIAGNOSIS — M1990 Unspecified osteoarthritis, unspecified site: Secondary | ICD-10-CM

## 2022-02-13 DIAGNOSIS — I2510 Atherosclerotic heart disease of native coronary artery without angina pectoris: Secondary | ICD-10-CM | POA: Diagnosis present

## 2022-02-13 HISTORY — PX: TEE WITHOUT CARDIOVERSION: SHX5443

## 2022-02-13 HISTORY — PX: CORONARY ARTERY BYPASS GRAFT: SHX141

## 2022-02-13 LAB — POCT I-STAT 7, (LYTES, BLD GAS, ICA,H+H)
Acid-Base Excess: 1 mmol/L (ref 0.0–2.0)
Acid-Base Excess: 0 mmol/L (ref 0.0–2.0)
Acid-Base Excess: 3 mmol/L — ABNORMAL HIGH (ref 0.0–2.0)
Acid-Base Excess: 0 mmol/L (ref 0.0–2.0)
Acid-Base Excess: 0 mmol/L (ref 0.0–2.0)
Acid-base deficit: 3 mmol/L — ABNORMAL HIGH (ref 0.0–2.0)
Acid-base deficit: 4 mmol/L — ABNORMAL HIGH (ref 0.0–2.0)
Bicarbonate: 26.6 mmol/L (ref 20.0–28.0)
Bicarbonate: 24.7 mmol/L (ref 20.0–28.0)
Bicarbonate: 27 mmol/L (ref 20.0–28.0)
Bicarbonate: 24.2 mmol/L (ref 20.0–28.0)
Bicarbonate: 23.4 mmol/L (ref 20.0–28.0)
Bicarbonate: 26.6 mmol/L (ref 20.0–28.0)
Bicarbonate: 22.2 mmol/L (ref 20.0–28.0)
Calcium, Ion: 1.24 mmol/L (ref 1.15–1.40)
Calcium, Ion: 0.91 mmol/L — ABNORMAL LOW (ref 1.15–1.40)
Calcium, Ion: 1.03 mmol/L — ABNORMAL LOW (ref 1.15–1.40)
Calcium, Ion: 1.05 mmol/L — ABNORMAL LOW (ref 1.15–1.40)
Calcium, Ion: 1.12 mmol/L — ABNORMAL LOW (ref 1.15–1.40)
Calcium, Ion: 1.1 mmol/L — ABNORMAL LOW (ref 1.15–1.40)
Calcium, Ion: 1.12 mmol/L — ABNORMAL LOW (ref 1.15–1.40)
HCT: 36 % (ref 36.0–46.0)
HCT: 24 % — ABNORMAL LOW (ref 36.0–46.0)
HCT: 26 % — ABNORMAL LOW (ref 36.0–46.0)
HCT: 25 % — ABNORMAL LOW (ref 36.0–46.0)
HCT: 33 % — ABNORMAL LOW (ref 36.0–46.0)
HCT: 31 % — ABNORMAL LOW (ref 36.0–46.0)
HCT: 31 % — ABNORMAL LOW (ref 36.0–46.0)
Hemoglobin: 12.2 g/dL (ref 12.0–15.0)
Hemoglobin: 8.2 g/dL — ABNORMAL LOW (ref 12.0–15.0)
Hemoglobin: 8.8 g/dL — ABNORMAL LOW (ref 12.0–15.0)
Hemoglobin: 8.5 g/dL — ABNORMAL LOW (ref 12.0–15.0)
Hemoglobin: 11.2 g/dL — ABNORMAL LOW (ref 12.0–15.0)
Hemoglobin: 10.5 g/dL — ABNORMAL LOW (ref 12.0–15.0)
Hemoglobin: 10.5 g/dL — ABNORMAL LOW (ref 12.0–15.0)
O2 Saturation: 100 %
O2 Saturation: 100 %
O2 Saturation: 100 %
O2 Saturation: 95 %
O2 Saturation: 95 %
O2 Saturation: 99 %
O2 Saturation: 95 %
Patient temperature: 35.9
Patient temperature: 36.3
Patient temperature: 36.5
Potassium: 4.3 mmol/L (ref 3.5–5.1)
Potassium: 4 mmol/L (ref 3.5–5.1)
Potassium: 5.3 mmol/L — ABNORMAL HIGH (ref 3.5–5.1)
Potassium: 4.6 mmol/L (ref 3.5–5.1)
Potassium: 3.9 mmol/L (ref 3.5–5.1)
Potassium: 4.6 mmol/L (ref 3.5–5.1)
Potassium: 4.1 mmol/L (ref 3.5–5.1)
Sodium: 136 mmol/L (ref 135–145)
Sodium: 135 mmol/L (ref 135–145)
Sodium: 136 mmol/L (ref 135–145)
Sodium: 135 mmol/L (ref 135–145)
Sodium: 137 mmol/L (ref 135–145)
Sodium: 134 mmol/L — ABNORMAL LOW (ref 135–145)
Sodium: 135 mmol/L (ref 135–145)
TCO2: 28 mmol/L (ref 22–32)
TCO2: 26 mmol/L (ref 22–32)
TCO2: 28 mmol/L (ref 22–32)
TCO2: 25 mmol/L (ref 22–32)
TCO2: 25 mmol/L (ref 22–32)
TCO2: 28 mmol/L (ref 22–32)
TCO2: 24 mmol/L (ref 22–32)
pCO2 arterial: 45.8 mmHg (ref 32–48)
pCO2 arterial: 36.9 mmHg (ref 32–48)
pCO2 arterial: 37.9 mmHg (ref 32–48)
pCO2 arterial: 37.2 mmHg (ref 32–48)
pCO2 arterial: 44.6 mmHg (ref 32–48)
pCO2 arterial: 47 mmHg (ref 32–48)
pCO2 arterial: 43.8 mmHg (ref 32–48)
pH, Arterial: 7.372 (ref 7.35–7.45)
pH, Arterial: 7.434 (ref 7.35–7.45)
pH, Arterial: 7.461 — ABNORMAL HIGH (ref 7.35–7.45)
pH, Arterial: 7.421 (ref 7.35–7.45)
pH, Arterial: 7.322 — ABNORMAL LOW (ref 7.35–7.45)
pH, Arterial: 7.357 (ref 7.35–7.45)
pH, Arterial: 7.311 — ABNORMAL LOW (ref 7.35–7.45)
pO2, Arterial: 348 mmHg — ABNORMAL HIGH (ref 83–108)
pO2, Arterial: 410 mmHg — ABNORMAL HIGH (ref 83–108)
pO2, Arterial: 464 mmHg — ABNORMAL HIGH (ref 83–108)
pO2, Arterial: 72 mmHg — ABNORMAL LOW (ref 83–108)
pO2, Arterial: 75 mmHg — ABNORMAL LOW (ref 83–108)
pO2, Arterial: 122 mmHg — ABNORMAL HIGH (ref 83–108)
pO2, Arterial: 80 mmHg — ABNORMAL LOW (ref 83–108)

## 2022-02-13 LAB — MAGNESIUM: Magnesium: 2.8 mg/dL — ABNORMAL HIGH (ref 1.7–2.4)

## 2022-02-13 LAB — POCT I-STAT, CHEM 8
BUN: 15 mg/dL (ref 8–23)
BUN: 15 mg/dL (ref 8–23)
BUN: 14 mg/dL (ref 8–23)
BUN: 14 mg/dL (ref 8–23)
Calcium, Ion: 1.23 mmol/L (ref 1.15–1.40)
Calcium, Ion: 1.2 mmol/L (ref 1.15–1.40)
Calcium, Ion: 1.02 mmol/L — ABNORMAL LOW (ref 1.15–1.40)
Calcium, Ion: 1.05 mmol/L — ABNORMAL LOW (ref 1.15–1.40)
Chloride: 101 mmol/L (ref 98–111)
Chloride: 100 mmol/L (ref 98–111)
Chloride: 100 mmol/L (ref 98–111)
Chloride: 99 mmol/L (ref 98–111)
Creatinine, Ser: 0.7 mg/dL (ref 0.44–1.00)
Creatinine, Ser: 0.7 mg/dL (ref 0.44–1.00)
Creatinine, Ser: 0.5 mg/dL (ref 0.44–1.00)
Creatinine, Ser: 0.6 mg/dL (ref 0.44–1.00)
Glucose, Bld: 163 mg/dL — ABNORMAL HIGH (ref 70–99)
Glucose, Bld: 169 mg/dL — ABNORMAL HIGH (ref 70–99)
Glucose, Bld: 130 mg/dL — ABNORMAL HIGH (ref 70–99)
Glucose, Bld: 153 mg/dL — ABNORMAL HIGH (ref 70–99)
HCT: 36 % (ref 36.0–46.0)
HCT: 33 % — ABNORMAL LOW (ref 36.0–46.0)
HCT: 25 % — ABNORMAL LOW (ref 36.0–46.0)
HCT: 26 % — ABNORMAL LOW (ref 36.0–46.0)
Hemoglobin: 12.2 g/dL (ref 12.0–15.0)
Hemoglobin: 11.2 g/dL — ABNORMAL LOW (ref 12.0–15.0)
Hemoglobin: 8.5 g/dL — ABNORMAL LOW (ref 12.0–15.0)
Hemoglobin: 8.8 g/dL — ABNORMAL LOW (ref 12.0–15.0)
Potassium: 4.3 mmol/L (ref 3.5–5.1)
Potassium: 4 mmol/L (ref 3.5–5.1)
Potassium: 4.9 mmol/L (ref 3.5–5.1)
Potassium: 4.6 mmol/L (ref 3.5–5.1)
Sodium: 136 mmol/L (ref 135–145)
Sodium: 135 mmol/L (ref 135–145)
Sodium: 136 mmol/L (ref 135–145)
Sodium: 135 mmol/L (ref 135–145)
TCO2: 23 mmol/L (ref 22–32)
TCO2: 23 mmol/L (ref 22–32)
TCO2: 25 mmol/L (ref 22–32)
TCO2: 21 mmol/L — ABNORMAL LOW (ref 22–32)

## 2022-02-13 LAB — HEMOGLOBIN AND HEMATOCRIT, BLOOD
HCT: 25.1 % — ABNORMAL LOW (ref 36.0–46.0)
Hemoglobin: 8.7 g/dL — ABNORMAL LOW (ref 12.0–15.0)

## 2022-02-13 LAB — BASIC METABOLIC PANEL
Anion gap: 10 (ref 5–15)
Anion gap: 6 (ref 5–15)
BUN: 17 mg/dL (ref 8–23)
BUN: 10 mg/dL (ref 8–23)
CO2: 23 mmol/L (ref 22–32)
CO2: 23 mmol/L (ref 22–32)
Calcium: 8.9 mg/dL (ref 8.9–10.3)
Calcium: 7.5 mg/dL — ABNORMAL LOW (ref 8.9–10.3)
Chloride: 103 mmol/L (ref 98–111)
Chloride: 105 mmol/L (ref 98–111)
Creatinine, Ser: 0.86 mg/dL (ref 0.44–1.00)
Creatinine, Ser: 0.68 mg/dL (ref 0.44–1.00)
GFR, Estimated: >60 mL/min (ref >60)
GFR, Estimated: >60 mL/min (ref >60)
Glucose, Bld: 149 mg/dL — ABNORMAL HIGH (ref 70–99)
Glucose, Bld: 137 mg/dL — ABNORMAL HIGH (ref 70–99)
Potassium: 4.4 mmol/L (ref 3.5–5.1)
Potassium: 4.1 mmol/L (ref 3.5–5.1)
Sodium: 136 mmol/L (ref 135–145)
Sodium: 134 mmol/L — ABNORMAL LOW (ref 135–145)

## 2022-02-13 LAB — CBC
HCT: 36.4 % (ref 36.0–46.0)
HCT: 31.7 % — ABNORMAL LOW (ref 36.0–46.0)
HCT: 30.6 % — ABNORMAL LOW (ref 36.0–46.0)
Hemoglobin: 12.5 g/dL (ref 12.0–15.0)
Hemoglobin: 11.3 g/dL — ABNORMAL LOW (ref 12.0–15.0)
Hemoglobin: 10.6 g/dL — ABNORMAL LOW (ref 12.0–15.0)
MCH: 32.2 pg (ref 26.0–34.0)
MCH: 33.5 pg (ref 26.0–34.0)
MCH: 32.2 pg (ref 26.0–34.0)
MCHC: 34.3 g/dL (ref 30.0–36.0)
MCHC: 35.6 g/dL (ref 30.0–36.0)
MCHC: 34.6 g/dL (ref 30.0–36.0)
MCV: 93.8 fL (ref 80.0–100.0)
MCV: 94.1 fL (ref 80.0–100.0)
MCV: 93 fL (ref 80.0–100.0)
Platelets: 242 10*3/uL (ref 150–400)
Platelets: 193 10*3/uL (ref 150–400)
Platelets: 189 10*3/uL (ref 150–400)
RBC: 3.88 MIL/uL (ref 3.87–5.11)
RBC: 3.37 MIL/uL — ABNORMAL LOW (ref 3.87–5.11)
RBC: 3.29 MIL/uL — ABNORMAL LOW (ref 3.87–5.11)
RDW: 12.5 % (ref 11.5–15.5)
RDW: 12.2 % (ref 11.5–15.5)
RDW: 12.2 % (ref 11.5–15.5)
WBC: 9.9 10*3/uL (ref 4.0–10.5)
WBC: 18.3 10*3/uL — ABNORMAL HIGH (ref 4.0–10.5)
WBC: 15.4 10*3/uL — ABNORMAL HIGH (ref 4.0–10.5)
nRBC: 0 % (ref 0.0–0.2)
nRBC: 0 % (ref 0.0–0.2)
nRBC: 0 % (ref 0.0–0.2)

## 2022-02-13 LAB — HEPARIN LEVEL (UNFRACTIONATED): Heparin Unfractionated: 0.52 IU/mL (ref 0.30–0.70)

## 2022-02-13 LAB — GLUCOSE, CAPILLARY
Glucose-Capillary: 107 mg/dL — ABNORMAL HIGH (ref 70–99)
Glucose-Capillary: 125 mg/dL — ABNORMAL HIGH (ref 70–99)
Glucose-Capillary: 130 mg/dL — ABNORMAL HIGH (ref 70–99)
Glucose-Capillary: 134 mg/dL — ABNORMAL HIGH (ref 70–99)
Glucose-Capillary: 136 mg/dL — ABNORMAL HIGH (ref 70–99)
Glucose-Capillary: 145 mg/dL — ABNORMAL HIGH (ref 70–99)
Glucose-Capillary: 148 mg/dL — ABNORMAL HIGH (ref 70–99)
Glucose-Capillary: 152 mg/dL — ABNORMAL HIGH (ref 70–99)
Glucose-Capillary: 201 mg/dL — ABNORMAL HIGH (ref 70–99)

## 2022-02-13 LAB — PLATELET COUNT: Platelets: 204 10*3/uL (ref 150–400)

## 2022-02-13 LAB — APTT: aPTT: 38 seconds — ABNORMAL HIGH (ref 24–36)

## 2022-02-13 LAB — PROTIME-INR
INR: 1.4 — ABNORMAL HIGH (ref 0.8–1.2)
Prothrombin Time: 16.6 seconds — ABNORMAL HIGH (ref 11.4–15.2)

## 2022-02-13 LAB — LIPOPROTEIN A (LPA): Lipoprotein (a): 24.4 nmol/L (ref <75.0)

## 2022-02-13 SURGERY — CORONARY ARTERY BYPASS GRAFTING (CABG)
Anesthesia: General | Site: Chest

## 2022-02-13 MED ORDER — AMIODARONE HCL IN DEXTROSE 360-4.14 MG/200ML-% IV SOLN
30.0000 mg/h | INTRAVENOUS | Status: AC
  Administered 2022-02-13 – 2022-02-15 (×4): 30 mg/h via INTRAVENOUS
  Filled 2022-02-13 (×5): qty 200

## 2022-02-13 MED ORDER — ACETAMINOPHEN 160 MG/5ML PO SOLN: 1000.0000 mg | Freq: Four times a day (QID) | ORAL | Status: DC

## 2022-02-13 MED ORDER — PANTOPRAZOLE SODIUM 40 MG PO TBEC
40.0000 mg | DELAYED_RELEASE_TABLET | Freq: Every day | ORAL | Status: DC
  Administered 2022-02-15 – 2022-02-18 (×4): 40 mg via ORAL
  Filled 2022-02-13 (×4): qty 1

## 2022-02-13 MED ORDER — ROCURONIUM BROMIDE 10 MG/ML (PF) SYRINGE
PREFILLED_SYRINGE | INTRAVENOUS | Status: DC | PRN
  Administered 2022-02-13: 30 mg via INTRAVENOUS
  Administered 2022-02-13: 70 mg via INTRAVENOUS
  Administered 2022-02-13: 50 mg via INTRAVENOUS

## 2022-02-13 MED ORDER — SODIUM CHLORIDE 0.9 % IV SOLN: INTRAVENOUS | Status: DC

## 2022-02-13 MED ORDER — METOPROLOL TARTRATE 5 MG/5ML IV SOLN
INTRAVENOUS | Status: DC | PRN
  Administered 2022-02-13 (×2): 2.5 mg via INTRAVENOUS

## 2022-02-13 MED ORDER — ORAL CARE MOUTH RINSE: 15.0000 mL | OROMUCOSAL | Status: DC | PRN

## 2022-02-13 MED ORDER — ASPIRIN 81 MG PO CHEW: 324.0000 mg | CHEWABLE_TABLET | Freq: Every day | ORAL | Status: DC

## 2022-02-13 MED ORDER — FENTANYL CITRATE (PF) 250 MCG/5ML IJ SOLN
INTRAMUSCULAR | Status: AC
  Filled 2022-02-13: qty 5

## 2022-02-13 MED ORDER — PHENYLEPHRINE 80 MCG/ML (10ML) SYRINGE FOR IV PUSH (FOR BLOOD PRESSURE SUPPORT)
PREFILLED_SYRINGE | INTRAVENOUS | Status: DC | PRN
  Administered 2022-02-13 (×2): 80 ug via INTRAVENOUS
  Administered 2022-02-13: 160 ug via INTRAVENOUS
  Administered 2022-02-13: 80 ug via INTRAVENOUS

## 2022-02-13 MED ORDER — METOPROLOL TARTRATE 5 MG/5ML IV SOLN: 2.5000 mg | INTRAVENOUS | Status: DC | PRN

## 2022-02-13 MED ORDER — PROPOFOL 10 MG/ML IV BOLUS
INTRAVENOUS | Status: DC | PRN
  Administered 2022-02-13: 30 mg via INTRAVENOUS
  Administered 2022-02-13: 40 mg via INTRAVENOUS
  Administered 2022-02-13: 50 mg via INTRAVENOUS

## 2022-02-13 MED ORDER — PROTAMINE SULFATE 10 MG/ML IV SOLN
INTRAVENOUS | Status: AC
  Filled 2022-02-13: qty 25

## 2022-02-13 MED ORDER — LACTATED RINGERS IV SOLN: INTRAVENOUS | Status: DC | PRN

## 2022-02-13 MED ORDER — MAGNESIUM SULFATE 4 GM/100ML IV SOLN
4.0000 g | Freq: Once | INTRAVENOUS | Status: AC
  Administered 2022-02-13: 4 g via INTRAVENOUS
  Filled 2022-02-13: qty 100

## 2022-02-13 MED ORDER — ALBUMIN HUMAN 5 % IV SOLN: INTRAVENOUS | Status: DC | PRN

## 2022-02-13 MED ORDER — INSULIN REGULAR(HUMAN) IN NACL 100-0.9 UT/100ML-% IV SOLN
INTRAVENOUS | Status: DC
  Administered 2022-02-14: 2.4 [IU]/h via INTRAVENOUS
  Filled 2022-02-13: qty 100

## 2022-02-13 MED ORDER — FOLIC ACID 800 MCG PO TABS
400.0000 ug | ORAL_TABLET | Freq: Every day | ORAL | Status: DC
Start: 2022-02-13 — End: 2022-02-13

## 2022-02-13 MED ORDER — POLYVINYL ALCOHOL 1.4 % OP SOLN
1.0000 [drp] | Freq: Every day | OPHTHALMIC | Status: DC
  Administered 2022-02-13 – 2022-02-18 (×6): 1 [drp] via OPHTHALMIC
  Filled 2022-02-13: qty 15

## 2022-02-13 MED ORDER — ROCURONIUM BROMIDE 10 MG/ML (PF) SYRINGE
PREFILLED_SYRINGE | INTRAVENOUS | Status: AC
  Filled 2022-02-13: qty 10

## 2022-02-13 MED ORDER — VANCOMYCIN HCL IN DEXTROSE 1-5 GM/200ML-% IV SOLN
1000.0000 mg | Freq: Once | INTRAVENOUS | Status: AC
Start: 2022-02-13 — End: 2022-02-13
  Administered 2022-02-13: 1000 mg via INTRAVENOUS

## 2022-02-13 MED ORDER — PLASMA-LYTE A IV SOLN
INTRAVENOUS | Status: DC | PRN
  Administered 2022-02-13: 500 mL

## 2022-02-13 MED ORDER — ALBUMIN HUMAN 5 % IV SOLN
250.0000 mL | INTRAVENOUS | Status: AC | PRN
  Administered 2022-02-13 (×3): 12.5 g via INTRAVENOUS
  Filled 2022-02-13: qty 250

## 2022-02-13 MED ORDER — NITROGLYCERIN IN D5W 200-5 MCG/ML-% IV SOLN: 0.0000 ug/min | INTRAVENOUS | Status: DC

## 2022-02-13 MED ORDER — METOPROLOL TARTRATE 12.5 MG HALF TABLET
12.5000 mg | ORAL_TABLET | Freq: Two times a day (BID) | ORAL | Status: DC
  Administered 2022-02-14 – 2022-02-18 (×8): 12.5 mg via ORAL
  Filled 2022-02-13 (×11): qty 1

## 2022-02-13 MED ORDER — FOLIC ACID 1 MG PO TABS
0.5000 mg | ORAL_TABLET | Freq: Every day | ORAL | Status: DC
  Administered 2022-02-14 – 2022-02-18 (×5): 0.5 mg via ORAL
  Filled 2022-02-13 (×6): qty 1

## 2022-02-13 MED ORDER — CHLORHEXIDINE GLUCONATE 0.12 % MT SOLN
15.0000 mL | OROMUCOSAL | Status: AC
  Administered 2022-02-13: 15 mL via OROMUCOSAL
  Filled 2022-02-13: qty 15

## 2022-02-13 MED ORDER — TRAMADOL HCL 50 MG PO TABS
50.0000 mg | ORAL_TABLET | Freq: Four times a day (QID) | ORAL | Status: DC | PRN
  Administered 2022-02-15: 50 mg via ORAL
  Filled 2022-02-13: qty 1

## 2022-02-13 MED ORDER — BISACODYL 5 MG PO TBEC
10.0000 mg | DELAYED_RELEASE_TABLET | Freq: Every day | ORAL | Status: DC
  Administered 2022-02-14 – 2022-02-16 (×3): 10 mg via ORAL
  Filled 2022-02-13 (×4): qty 2

## 2022-02-13 MED ORDER — ONDANSETRON HCL 4 MG/2ML IJ SOLN
4.0000 mg | Freq: Four times a day (QID) | INTRAMUSCULAR | Status: DC | PRN
  Administered 2022-02-13 – 2022-02-17 (×7): 4 mg via INTRAVENOUS
  Filled 2022-02-13 (×7): qty 2

## 2022-02-13 MED ORDER — SODIUM CHLORIDE 0.9 % IV SOLN: 250.0000 mL | INTRAVENOUS | Status: DC

## 2022-02-13 MED ORDER — HEPARIN SODIUM (PORCINE) 1000 UNIT/ML IJ SOLN
INTRAMUSCULAR | Status: DC | PRN
  Administered 2022-02-13: 13000 [IU] via INTRAVENOUS
  Administered 2022-02-13: 17000 [IU] via INTRAVENOUS

## 2022-02-13 MED ORDER — MICROFIBRILLAR COLL HEMOSTAT EX PADS
MEDICATED_PAD | CUTANEOUS | Status: DC | PRN
  Administered 2022-02-13: 1 via TOPICAL

## 2022-02-13 MED ORDER — SODIUM CHLORIDE 0.9% FLUSH: 3.0000 mL | INTRAVENOUS | Status: DC | PRN

## 2022-02-13 MED ORDER — MIDAZOLAM HCL (PF) 10 MG/2ML IJ SOLN
INTRAMUSCULAR | Status: AC
  Filled 2022-02-13: qty 2

## 2022-02-13 MED ORDER — MIDAZOLAM HCL 2 MG/2ML IJ SOLN: 2.0000 mg | INTRAMUSCULAR | Status: DC | PRN

## 2022-02-13 MED ORDER — FENTANYL CITRATE (PF) 250 MCG/5ML IJ SOLN
INTRAMUSCULAR | Status: DC | PRN
  Administered 2022-02-13: 250 ug via INTRAVENOUS
  Administered 2022-02-13 (×2): 50 ug via INTRAVENOUS
  Administered 2022-02-13: 150 ug via INTRAVENOUS
  Administered 2022-02-13: 100 ug via INTRAVENOUS
  Administered 2022-02-13: 200 ug via INTRAVENOUS
  Administered 2022-02-13: 100 ug via INTRAVENOUS

## 2022-02-13 MED ORDER — DEXMEDETOMIDINE HCL IN NACL 400 MCG/100ML IV SOLN: 0.0000 ug/kg/h | INTRAVENOUS | Status: DC

## 2022-02-13 MED ORDER — SODIUM CHLORIDE 0.45 % IV SOLN: INTRAVENOUS | Status: DC | PRN

## 2022-02-13 MED ORDER — LEVOFLOXACIN IN D5W 750 MG/150ML IV SOLN
750.0000 mg | INTRAVENOUS | Status: AC
  Administered 2022-02-14: 750 mg via INTRAVENOUS
  Filled 2022-02-13: qty 150

## 2022-02-13 MED ORDER — METOPROLOL TARTRATE 25 MG/10 ML ORAL SUSPENSION
12.5000 mg | Freq: Two times a day (BID) | ORAL | Status: DC
  Filled 2022-02-13 (×6): qty 5

## 2022-02-13 MED ORDER — POTASSIUM CHLORIDE 10 MEQ/50ML IV SOLN
10.0000 meq | INTRAVENOUS | Status: AC
  Administered 2022-02-13 (×3): 10 meq via INTRAVENOUS

## 2022-02-13 MED ORDER — DEXTROSE 50 % IV SOLN: 0.0000 mL | INTRAVENOUS | Status: DC | PRN

## 2022-02-13 MED ORDER — HEPARIN SODIUM (PORCINE) 1000 UNIT/ML IJ SOLN
INTRAMUSCULAR | Status: AC
  Filled 2022-02-13: qty 1

## 2022-02-13 MED ORDER — SODIUM CHLORIDE (PF) 0.9 % IJ SOLN
OROMUCOSAL | Status: DC | PRN
  Administered 2022-02-13 (×3): 4 mL via TOPICAL

## 2022-02-13 MED ORDER — PROTAMINE SULFATE 10 MG/ML IV SOLN
INTRAVENOUS | Status: DC | PRN
  Administered 2022-02-13: 275 mg via INTRAVENOUS
  Administered 2022-02-13: 25 mg via INTRAVENOUS

## 2022-02-13 MED ORDER — SODIUM CHLORIDE 0.9 % IV SOLN: INTRAVENOUS | Status: DC | PRN

## 2022-02-13 MED ORDER — MORPHINE SULFATE (PF) 2 MG/ML IV SOLN
1.0000 mg | INTRAVENOUS | Status: DC | PRN
  Administered 2022-02-13 – 2022-02-14 (×4): 2 mg via INTRAVENOUS
  Filled 2022-02-13 (×4): qty 1

## 2022-02-13 MED ORDER — LACTATED RINGERS IV SOLN
INTRAVENOUS | Status: DC
Start: 2022-02-13 — End: 2022-02-15

## 2022-02-13 MED ORDER — SODIUM CHLORIDE 0.9% FLUSH
3.0000 mL | Freq: Two times a day (BID) | INTRAVENOUS | Status: DC
  Administered 2022-02-14 – 2022-02-15 (×3): 3 mL via INTRAVENOUS

## 2022-02-13 MED ORDER — DOCUSATE SODIUM 100 MG PO CAPS
200.0000 mg | ORAL_CAPSULE | Freq: Every day | ORAL | Status: DC
  Administered 2022-02-14 – 2022-02-16 (×3): 200 mg via ORAL
  Filled 2022-02-13 (×4): qty 2

## 2022-02-13 MED ORDER — MIDAZOLAM HCL (PF) 5 MG/ML IJ SOLN
INTRAMUSCULAR | Status: DC | PRN
  Administered 2022-02-13: 2 mg via INTRAVENOUS
  Administered 2022-02-13: 1 mg via INTRAVENOUS
  Administered 2022-02-13: 2 mg via INTRAVENOUS
  Administered 2022-02-13 (×2): 1 mg via INTRAVENOUS

## 2022-02-13 MED ORDER — SODIUM CHLORIDE 0.9 % IR SOLN
Status: DC | PRN
  Administered 2022-02-13: 4000 mL

## 2022-02-13 MED ORDER — ASPIRIN 325 MG PO TBEC
325.0000 mg | DELAYED_RELEASE_TABLET | Freq: Every day | ORAL | Status: DC
  Administered 2022-02-14 – 2022-02-18 (×5): 325 mg via ORAL
  Filled 2022-02-13 (×5): qty 1

## 2022-02-13 MED ORDER — LACTATED RINGERS IV SOLN: 500.0000 mL | Freq: Once | INTRAVENOUS | Status: DC | PRN

## 2022-02-13 MED ORDER — FLUTICASONE PROPIONATE 50 MCG/ACT NA SUSP
2.0000 | Freq: Every evening | NASAL | Status: DC
  Administered 2022-02-14 – 2022-02-17 (×4): 2 via NASAL
  Filled 2022-02-13: qty 16

## 2022-02-13 MED ORDER — PROPOFOL 10 MG/ML IV BOLUS
INTRAVENOUS | Status: AC
  Filled 2022-02-13: qty 20

## 2022-02-13 MED ORDER — ACETAMINOPHEN 500 MG PO TABS
1000.0000 mg | ORAL_TABLET | Freq: Four times a day (QID) | ORAL | Status: DC
  Administered 2022-02-13 – 2022-02-16 (×9): 1000 mg via ORAL
  Filled 2022-02-13 (×8): qty 2

## 2022-02-13 MED ORDER — FAMOTIDINE IN NACL 20-0.9 MG/50ML-% IV SOLN
20.0000 mg | Freq: Two times a day (BID) | INTRAVENOUS | Status: DC
  Administered 2022-02-13: 20 mg via INTRAVENOUS
  Filled 2022-02-13: qty 50

## 2022-02-13 MED ORDER — ARTIFICIAL TEARS OPHTHALMIC OINT
TOPICAL_OINTMENT | OPHTHALMIC | Status: AC
  Filled 2022-02-13: qty 3.5

## 2022-02-13 MED ORDER — AMIODARONE HCL IN DEXTROSE 360-4.14 MG/200ML-% IV SOLN
60.0000 mg/h | INTRAVENOUS | Status: AC
  Administered 2022-02-13 (×2): 60 mg/h via INTRAVENOUS
  Filled 2022-02-13 (×2): qty 200

## 2022-02-13 MED ORDER — PHENYLEPHRINE HCL-NACL 20-0.9 MG/250ML-% IV SOLN
0.0000 ug/min | INTRAVENOUS | Status: DC
  Filled 2022-02-13: qty 250

## 2022-02-13 MED ORDER — ORAL CARE MOUTH RINSE
15.0000 mL | OROMUCOSAL | Status: DC
  Administered 2022-02-13 – 2022-02-14 (×7): 15 mL via OROMUCOSAL

## 2022-02-13 MED ORDER — CHLORHEXIDINE GLUCONATE CLOTH 2 % EX PADS
6.0000 | MEDICATED_PAD | Freq: Every day | CUTANEOUS | Status: DC
  Administered 2022-02-13 – 2022-02-18 (×6): 6 via TOPICAL

## 2022-02-13 MED ORDER — ACETAMINOPHEN 160 MG/5ML PO SOLN: 650.0000 mg | Freq: Once | ORAL | Status: AC

## 2022-02-13 MED ORDER — POLYVINYL ALCOHOL-POVIDONE PF 1.4-0.6 % OP SOLN: 1.0000 [drp] | Freq: Every day | OPHTHALMIC | Status: DC

## 2022-02-13 MED ORDER — ACETAMINOPHEN 650 MG RE SUPP
650.0000 mg | Freq: Once | RECTAL | Status: AC
  Administered 2022-02-13: 650 mg via RECTAL

## 2022-02-13 MED ORDER — BISACODYL 10 MG RE SUPP: 10.0000 mg | Freq: Every day | RECTAL | Status: DC

## 2022-02-13 MED ORDER — OXYCODONE HCL 5 MG PO TABS
5.0000 mg | ORAL_TABLET | ORAL | Status: DC | PRN
  Administered 2022-02-13: 5 mg via ORAL
  Filled 2022-02-13: qty 1

## 2022-02-13 MED ORDER — AMIODARONE LOAD VIA INFUSION
150.0000 mg | Freq: Once | INTRAVENOUS | Status: AC
  Administered 2022-02-13: 150 mg via INTRAVENOUS
  Filled 2022-02-13: qty 83.34

## 2022-02-13 SURGICAL SUPPLY — 99 items
BAG DECANTER FOR FLEXI CONT (MISCELLANEOUS) ×3 IMPLANT
BLADE CLIPPER SURG (BLADE) ×2 IMPLANT
BLADE STERNUM SYSTEM 6 (BLADE) ×3 IMPLANT
BLOWER MISTER CAL-MED (MISCELLANEOUS) ×1 IMPLANT
BNDG ELASTIC 4X5.8 VLCR STR LF (GAUZE/BANDAGES/DRESSINGS) ×2 IMPLANT
BNDG ELASTIC 6X5.8 VLCR STR LF (GAUZE/BANDAGES/DRESSINGS) ×2 IMPLANT
BNDG GAUZE ELAST 4 BULKY (GAUZE/BANDAGES/DRESSINGS) ×2 IMPLANT
CANISTER SUCT 3000ML PPV (MISCELLANEOUS) ×3 IMPLANT
CANNULA EZ GLIDE AORTIC 21FR (CANNULA) ×4 IMPLANT
CATH CPB KIT HENDRICKSON (MISCELLANEOUS) ×4 IMPLANT
CATH ROBINSON RED A/P 18FR (CATHETERS) ×4 IMPLANT
CATH THORACIC 36FR (CATHETERS) ×3 IMPLANT
CATH THORACIC 36FR RT ANG (CATHETERS) ×3 IMPLANT
CLIP TI WIDE RED SMALL 24 (CLIP) ×1 IMPLANT
CLIP VESOCCLUDE MED 24/CT (CLIP) IMPLANT
CLIP VESOCCLUDE SM WIDE 24/CT (CLIP) IMPLANT
CONTAINER PROTECT SURGISLUSH (MISCELLANEOUS) ×5 IMPLANT
DRAPE CARDIOVASCULAR INCISE (DRAPES) ×3
DRAPE SRG 135X102X78XABS (DRAPES) ×2 IMPLANT
DRAPE WARM FLUID 44X44 (DRAPES) ×3 IMPLANT
DRSG AQUACEL AG ADV 3.5X14 (GAUZE/BANDAGES/DRESSINGS) ×1 IMPLANT
DRSG COVADERM 4X14 (GAUZE/BANDAGES/DRESSINGS) ×3 IMPLANT
ELECT REM PT RETURN 9FT ADLT (ELECTROSURGICAL) ×6
ELECTRODE REM PT RTRN 9FT ADLT (ELECTROSURGICAL) ×4 IMPLANT
FELT TEFLON 1X6 (MISCELLANEOUS) ×6 IMPLANT
GAUZE 4X4 16PLY ~~LOC~~+RFID DBL (SPONGE) ×3 IMPLANT
GAUZE SPONGE 4X4 12PLY STRL (GAUZE/BANDAGES/DRESSINGS) ×5 IMPLANT
GAUZE SPONGE 4X4 12PLY STRL LF (GAUZE/BANDAGES/DRESSINGS) ×1 IMPLANT
GLOVE BIO SURGEON STRL SZ 6.5 (GLOVE) ×5 IMPLANT
GLOVE BIOGEL PI IND STRL 7.0 (GLOVE) IMPLANT
GLOVE BIOGEL PI IND STRL 8.5 (GLOVE) IMPLANT
GLOVE BIOGEL PI INDICATOR 7.0 (GLOVE) ×3
GLOVE BIOGEL PI INDICATOR 8.5 (GLOVE) ×3
GLOVE ECLIPSE 8.0 STRL XLNG CF (GLOVE) ×1 IMPLANT
GLOVE SS BIOGEL STRL SZ 7.5 (GLOVE) IMPLANT
GLOVE SUPERSENSE BIOGEL SZ 7.5 (GLOVE) ×3
GLOVE SURG SIGNA 7.5 PF LTX (GLOVE) ×9 IMPLANT
GLOVE SURG SS PI 7.0 STRL IVOR (GLOVE) ×1 IMPLANT
GLOVE SURG SS PI 7.5 STRL IVOR (GLOVE) ×2 IMPLANT
GOWN STRL REUS W/ TWL LRG LVL3 (GOWN DISPOSABLE) ×8 IMPLANT
GOWN STRL REUS W/ TWL XL LVL3 (GOWN DISPOSABLE) ×4 IMPLANT
GOWN STRL REUS W/TWL LRG LVL3 (GOWN DISPOSABLE) ×12
GOWN STRL REUS W/TWL XL LVL3 (GOWN DISPOSABLE) ×6
HEMOSTAT POWDER SURGIFOAM 1G (HEMOSTASIS) ×9 IMPLANT
HEMOSTAT SURGICEL 2X14 (HEMOSTASIS) ×3 IMPLANT
INSERT FOGARTY XLG (MISCELLANEOUS) IMPLANT
KIT BASIN OR (CUSTOM PROCEDURE TRAY) ×3 IMPLANT
KIT SUCTION CATH 14FR (SUCTIONS) ×5 IMPLANT
KIT TURNOVER KIT B (KITS) ×3 IMPLANT
KIT VASOVIEW HEMOPRO 2 VH 4000 (KITS) ×2 IMPLANT
MARKER GRAFT CORONARY BYPASS (MISCELLANEOUS) ×6 IMPLANT
NS IRRIG 1000ML POUR BTL (IV SOLUTION) ×15 IMPLANT
OFFPUMP STABILIZER SUV (MISCELLANEOUS) IMPLANT
PACK E OPEN HEART (SUTURE) ×3 IMPLANT
PACK OPEN HEART (CUSTOM PROCEDURE TRAY) ×3 IMPLANT
PAD ARMBOARD 7.5X6 YLW CONV (MISCELLANEOUS) ×6 IMPLANT
PAD ELECT DEFIB RADIOL ZOLL (MISCELLANEOUS) ×3 IMPLANT
PENCIL BUTTON HOLSTER BLD 10FT (ELECTRODE) ×2 IMPLANT
POSITIONER HEAD DONUT 9IN (MISCELLANEOUS) ×3 IMPLANT
PUNCH AORTIC ROTATE 4.0MM (MISCELLANEOUS) IMPLANT
PUNCH AORTIC ROTATE 4.5MM 8IN (MISCELLANEOUS) IMPLANT
PUNCH AORTIC ROTATE 5MM 8IN (MISCELLANEOUS) IMPLANT
SET MPS 3-ND DEL (MISCELLANEOUS) ×1 IMPLANT
SPONGE T-LAP 18X18 ~~LOC~~+RFID (SPONGE) ×8 IMPLANT
SPONGE T-LAP 4X18 ~~LOC~~+RFID (SPONGE) ×3 IMPLANT
SUPPORT HEART JANKE-BARRON (MISCELLANEOUS) ×2 IMPLANT
SUT BONE WAX W31G (SUTURE) ×2 IMPLANT
SUT MNCRL AB 4-0 PS2 18 (SUTURE) IMPLANT
SUT PROLENE 3 0 SH DA (SUTURE) ×3 IMPLANT
SUT PROLENE 4 0 RB 1 (SUTURE)
SUT PROLENE 4 0 SH DA (SUTURE) IMPLANT
SUT PROLENE 4-0 RB1 .5 CRCL 36 (SUTURE) IMPLANT
SUT PROLENE 5 0 C 1 36 (SUTURE) ×1 IMPLANT
SUT PROLENE 6 0 C 1 30 (SUTURE) ×6 IMPLANT
SUT PROLENE 7 0 BV 1 (SUTURE) ×1 IMPLANT
SUT PROLENE 7 0 BV1 MDA (SUTURE) ×2 IMPLANT
SUT PROLENE 8 0 BV175 6 (SUTURE) IMPLANT
SUT SILK  1 MH (SUTURE) ×9
SUT SILK 1 MH (SUTURE) IMPLANT
SUT STEEL 6MS V (SUTURE) ×3 IMPLANT
SUT STEEL STERNAL CCS#1 18IN (SUTURE) IMPLANT
SUT STEEL SZ 6 DBL 3X14 BALL (SUTURE) ×3 IMPLANT
SUT TEM PAC WIRE 2 0 SH (SUTURE) ×2 IMPLANT
SUT VIC AB 1 CTX 36 (SUTURE) ×9
SUT VIC AB 1 CTX36XBRD ANBCTR (SUTURE) ×4 IMPLANT
SUT VIC AB 2-0 CT1 27 (SUTURE)
SUT VIC AB 2-0 CT1 TAPERPNT 27 (SUTURE) IMPLANT
SUT VIC AB 2-0 CTX 27 (SUTURE) IMPLANT
SUT VIC AB 3-0 SH 27 (SUTURE)
SUT VIC AB 3-0 SH 27X BRD (SUTURE) IMPLANT
SUT VIC AB 3-0 X1 27 (SUTURE) IMPLANT
SUT VICRYL 4-0 PS2 18IN ABS (SUTURE) IMPLANT
SYSTEM SAHARA CHEST DRAIN ATS (WOUND CARE) ×3 IMPLANT
TOWEL GREEN STERILE (TOWEL DISPOSABLE) ×3 IMPLANT
TOWEL GREEN STERILE FF (TOWEL DISPOSABLE) ×3 IMPLANT
TRAY FOLEY SLVR 16FR TEMP STAT (SET/KITS/TRAYS/PACK) ×3 IMPLANT
TUBING LAP HI FLOW INSUFFLATIO (TUBING) ×2 IMPLANT
UNDERPAD 30X36 HEAVY ABSORB (UNDERPADS AND DIAPERS) ×3 IMPLANT
WATER STERILE IRR 1000ML POUR (IV SOLUTION) ×6 IMPLANT

## 2022-02-13 NOTE — Progress Notes (Addendum)
  Echocardiogram Echocardiogram transesophageal has been performed.  Teresa Marquez 02/13/2022, 9:45 AM

## 2022-02-13 NOTE — Progress Notes (Signed)
Patient ID: Teresa Marquez, female   DOB: 09-23-1940, 81 y.o.   MRN: [id number]  TCTS Evening Rounds:   Hemodynamically stable  CI = 2.2  Extubated and doing well.  Urine output good  CT output low  CBC    Component Value Date/Time   WBC 15.4 (H) 02/13/2022 1815   RBC 3.29 (L) 02/13/2022 1815   HGB 10.6 (L) 02/13/2022 1815   HGB 10.5 (L) 02/13/2022 1815   HCT 30.6 (L) 02/13/2022 1815   HCT 31.0 (L) 02/13/2022 1815   PLT 189 02/13/2022 1815   MCV 93.0 02/13/2022 1815   MCH 32.2 02/13/2022 1815   MCHC 34.6 02/13/2022 1815   RDW 12.2 02/13/2022 1815   LYMPHSABS 1.9 09/07/2009 1042   MONOABS 0.6 09/07/2009 1042   EOSABS 0.1 09/07/2009 1042   BASOSABS 0.0 09/07/2009 1042     BMET    Component Value Date/Time   NA 134 (L) 02/13/2022 1815   NA 135 02/13/2022 1815   NA 133 (L) 01/11/2022 1546   K 4.1 02/13/2022 1815   K 4.1 02/13/2022 1815   CL 105 02/13/2022 1815   CO2 23 02/13/2022 1815   GLUCOSE 137 (H) 02/13/2022 1815   BUN 10 02/13/2022 1815   BUN 11 01/11/2022 1546   CREATININE 0.68 02/13/2022 1815   CALCIUM 7.5 (L) 02/13/2022 1815   EGFR 75 01/11/2022 1546   GFRNONAA >60 02/13/2022 1815     A/P:  Stable postop course. Continue current plans

## 2022-02-13 NOTE — Progress Notes (Addendum)
ANTICOAGULATION CONSULT NOTE  Pharmacy Consult for heparin Indication: chest pain/ACS  Allergies  Allergen Reactions   Bactrim [Sulfamethoxazole-Trimethoprim]     Heart racing   Keflet [Cephalexin]     Makes me bleed   Tetracyclines & Related     Yeast infection     Patient Measurements: Height: '5\' 4"'$  (162.6 cm) Weight: 84.7 kg (186 lb 12.8 oz) IBW/kg (Calculated) : 54.7 Heparin Dosing Weight: 73kg  Vital Signs: Temp: 98 F (36.7 C) (08/09 0442) Temp Source: Oral (08/09 0442) BP: 152/86 (08/09 0442) Pulse Rate: 67 (08/09 0442)  Labs: Recent Labs    02/11/22 1608 02/11/22 1609 02/12/22 0808 02/12/22 0858 02/12/22 1855 02/13/22 0339  HGB 12.6 12.2 12.5  --   --  12.5  HCT 35.7* 36.0 37.2  --   --  36.4  PLT 278  --  262  --   --  242  APTT  --   --   --   --  70*  --   LABPROT  --   --   --   --  13.8  --   INR  --   --   --   --  1.1  --   HEPARINUNFRC  --   --  0.24*  --   --  0.52  CREATININE 0.89 0.90  --  0.85  --  0.86     Estimated Creatinine Clearance: 54.9 mL/min (by C-G formula based on SCr of 0.86 mg/dL).   Medical History: Past Medical History:  Diagnosis Date   Abdominal pain    Anxiety    Basal cell carcinoma    DDD (degenerative disc disease), lumbar    Diabetes mellitus without complication (HCC)    DM (diabetes mellitus) (HCC)    Drug-induced myopathy    Elevated blood pressure reading in office without diagnosis of hypertension    GERD (gastroesophageal reflux disease)    Hyperlipidemia    Menopause    NAFLD (nonalcoholic fatty liver disease)    OSA (obstructive sleep apnea)     Medications:  Medications Prior to Admission  Medication Sig Dispense Refill Last Dose   acetaminophen (TYLENOL) 500 MG tablet Take 500 mg by mouth every 6 (six) hours as needed for moderate pain.   Past Week   aspirin 81 MG chewable tablet Chew 81 mg by mouth daily.   02/11/2022   ezetimibe (ZETIA) 10 MG tablet Take 10 mg by mouth daily.   02/10/2022    fluticasone (FLONASE) 50 MCG/ACT nasal spray Place 2 sprays into both nostrils every evening.   0/03/7352   folic acid (FOLVITE) 299 MCG tablet Take 400 mcg daily by mouth.   02/10/2022   Multiple Vitamins-Minerals (HAIR SKIN & NAILS ADVANCED PO) Take 1 tablet by mouth daily.   02/10/2022   omeprazole (PRILOSEC) 20 MG capsule Take 10 mg by mouth every evening.   02/10/2022   Polyvinyl Alcohol-Povidone (REFRESH OP) Place 1 drop into both eyes daily.   02/10/2022   Turmeric 500 MG CAPS Take 2 capsules by mouth daily.   02/10/2022   vitamin B-12 (CYANOCOBALAMIN) 1000 MCG tablet Take 1,000 mcg daily by mouth.   02/10/2022   Scheduled:   aspirin  81 mg Oral Daily   atorvastatin  80 mg Oral Daily   carvedilol  3.125 mg Oral BID WC   epinephrine  0-10 mcg/min Intravenous To OR   heparin sodium (porcine) 2,500 Units, papaverine 30 mg in electrolyte-A (PLASMALYTE-A PH 7.4) 500 mL irrigation  Irrigation To OR   insulin   Intravenous To OR   [facility] Blood Cardioplegia vial (lidocaine/magnesium/mannitol 0.26g-4g-6.4g)   Intracoronary To OR   phenylephrine  30-200 mcg/min Intravenous To OR   potassium chloride  80 mEq Other To OR   sodium chloride flush  3 mL Intravenous Q12H   tranexamic acid  15 mg/kg Intravenous To OR   tranexamic acid  2 mg/kg Intracatheter To OR   Infusions:   sodium chloride      ceFAZolin (ANCEF) IV      ceFAZolin (ANCEF) IV     dexmedetomidine     heparin 30,000 units/NS 1000 mL solution for CELLSAVER     heparin 1,300 Units/hr (02/12/22 1925)   milrinone     nitroGLYCERIN     norepinephrine     tranexamic acid (CYKLOKAPRON) 2,500 mg in sodium chloride 0.9 % 250 mL (10 mg/mL) infusion     vancomycin      Assessment: Pt was admitted for CP. She is now s/p cath>>significant CAD of LAD. Plan for CABG consult, heparin resumed post-cath.  Heparin level is therapeutic, CBC stable, CABG today.   Goal of Therapy:  Heparin level 0.3-0.7 units/ml Monitor platelets by  anticoagulation protocol: Yes   Plan:  Heparin 1300 units/h - off on-call to OR Bypass today  Arrie Senate, PharmD, BCPS, [facility] Clinical Pharmacist [phone] Please check AMION for all [facility] numbers 02/13/2022

## 2022-02-13 NOTE — Plan of Care (Signed)

## 2022-02-13 NOTE — Progress Notes (Signed)
Progress Note  Patient Name: Teresa Marquez Date of Encounter: 02/13/2022  [facility] Cardiologist: Freada Bergeron, MD   Subjective   Underwent CABGx1 with LIMA to LAD  Inpatient Medications    Scheduled Meds:  [START ON 02/14/2022] acetaminophen  1,000 mg Oral Q6H   Or   [START ON 02/14/2022] acetaminophen (TYLENOL) oral liquid 160 mg/5 mL  1,000 mg Per Tube Q6H   [START ON 02/14/2022] aspirin EC  325 mg Oral Daily   Or   [START ON 02/14/2022] aspirin  324 mg Per Tube Daily   atorvastatin  80 mg Oral Daily   [START ON 02/14/2022] bisacodyl  10 mg Oral Daily   Or   [START ON 02/14/2022] bisacodyl  10 mg Rectal Daily   Chlorhexidine Gluconate Cloth  6 each Topical Daily   [START ON 02/14/2022] docusate sodium  200 mg Oral Daily   [START ON 02/14/2022] fluticasone  2 spray Each Nare QPM   folic acid  0.5 mg Oral Daily   metoprolol tartrate  12.5 mg Oral BID   Or   metoprolol tartrate  12.5 mg Per Tube BID   mouth rinse  15 mL Mouth Rinse Q2H   [START ON 02/15/2022] pantoprazole  40 mg Oral Daily   polyvinyl alcohol  1 drop Both Eyes Daily   [START ON 02/14/2022] sodium chloride flush  3 mL Intravenous Q12H   Continuous Infusions:  sodium chloride 10 mL/hr at 02/13/22 1900   [START ON 02/14/2022] sodium chloride     sodium chloride 20 mL/hr at 02/13/22 1900   albumin human 60 mL/hr at 02/13/22 1900   amiodarone 30 mg/hr (02/13/22 1900)   dexmedetomidine (PRECEDEX) IV infusion Stopped (02/13/22 1548)   famotidine (PEPCID) IV Stopped (02/13/22 1329)   insulin 4 Units/hr (02/13/22 1900)   lactated ringers     lactated ringers Stopped (02/13/22 1230)   lactated ringers 20 mL/hr at 02/13/22 1900   [START ON 02/14/2022] levofloxacin (LEVAQUIN) IV     nitroGLYCERIN Stopped (02/13/22 1302)   phenylephrine (NEO-SYNEPHRINE) Adult infusion 40 mcg/min (02/13/22 1900)   PRN Meds: sodium chloride, albumin human, dextrose, lactated ringers, metoprolol tartrate, midazolam,  morphine injection, ondansetron (ZOFRAN) IV, mouth rinse, oxyCODONE, [START ON 02/14/2022] sodium chloride flush, traMADol   Vital Signs    Vitals:   02/13/22 1900 02/13/22 1915 02/13/22 1930 02/13/22 2000  BP: 126/74  125/79 128/72  Pulse: 80 80 80 80  Resp: '13 12 15 14  '$ Temp: 98.1 F (36.7 C) 98.1 F (36.7 C) 98.1 F (36.7 C) 98.2 F (36.8 C)  TempSrc:      SpO2: 98% 98% 98% 95%  Weight:      Height:        Intake/Output Summary (Last 24 hours) at 02/13/2022 2051 Last data filed at 02/13/2022 2000 Gross per 24 hour  Intake 5068.02 ml  Output 3825 ml  Net 1243.02 ml      02/11/2022    5:04 PM 02/11/2022    3:01 PM 02/11/2022    1:35 PM  Last 3 Weights  Weight (lbs) 186 lb 12.8 oz 191 lb 9.3 oz 188 lb 12.8 oz  Weight (kg) 84.732 kg 86.9 kg 85.639 kg      Telemetry    *** - Personally Reviewed  ECG    *** - Personally Reviewed  Physical Exam  *** GEN: No acute distress.   Neck: No JVD Cardiac: RRR, no murmurs, rubs, or gallops.  Respiratory: Clear to auscultation bilaterally. GI:  Soft, nontender, non-distended  MS: No edema; No deformity. Neuro:  Nonfocal  Psych: Normal affect   Labs    High Sensitivity Troponin:  No results for input(s): "TROPONINIHS" in the last 720 hours.   Chemistry Recent Labs  Lab 02/12/22 0858 02/13/22 0339 02/13/22 0908 02/13/22 1112 02/13/22 1133 02/13/22 1136 02/13/22 1246 02/13/22 1712 02/13/22 1815  NA 137 136   < > 136   < > 135 137 134* 134*  135  K 4.2 4.4   < > 4.9   < > 4.6 3.9 4.6 4.1  4.1  CL 104 103   < > 100  --  99  --   --  105  CO2 22 23  --   --   --   --   --   --  23  GLUCOSE 167* 149*   < > 130*  --  153*  --   --  137*  BUN 14 17   < > 14  --  14  --   --  10  CREATININE 0.85 0.86   < > 0.50  --  0.60  --   --  0.68  CALCIUM 8.8* 8.9  --   --   --   --   --   --  7.5*  MG  --   --   --   --   --   --   --   --  2.8*  GFRNONAA >60 >60  --   --   --   --   --   --  >60  ANIONGAP 11 10  --   --   --   --    --   --  6   < > = values in this interval not displayed.    Lipids  Recent Labs  Lab 02/12/22 0808  CHOL 174  TRIG 160*  HDL 36*  LDLCALC 106*  CHOLHDL 4.8    Hematology Recent Labs  Lab 02/13/22 0339 02/13/22 0908 02/13/22 1105 02/13/22 1107 02/13/22 1246 02/13/22 1712 02/13/22 1815  WBC 9.9  --   --   --  18.3*  --  15.4*  RBC 3.88  --   --   --  3.37*  --  3.29*  HGB 12.5   < > 8.7*   < > 11.2*  11.3* 10.5* 10.6*  10.5*  HCT 36.4   < > 25.1*   < > 33.0*  31.7* 31.0* 30.6*  31.0*  MCV 93.8  --   --   --  94.1  --  93.0  MCH 32.2  --   --   --  33.5  --  32.2  MCHC 34.3  --   --   --  35.6  --  34.6  RDW 12.5  --   --   --  12.2  --  12.2  PLT 242  --  204  --  193  --  189   < > = values in this interval not displayed.   Thyroid No results for input(s): "TSH", "FREET4" in the last 168 hours.  BNPNo results for input(s): "BNP", "PROBNP" in the last 168 hours.  DDimer No results for input(s): "DDIMER" in the last 168 hours.   Radiology    DG Chest Port 1 View  Result Date: 02/13/2022 CLINICAL DATA:  Post CABG. EXAM: PORTABLE CHEST 1 VIEW COMPARISON:  02/12/2022 FINDINGS: Postop CABG. Endotracheal tube in good position. Swan-Ganz catheter  in the main pulmonary artery. NG tube in the stomach with the side hole near the GE junction. Mediastinal drain in place. Left chest tube in place. Negative for pneumothorax. Mild left lower lobe atelectasis. Minimal left effusion. Right lung clear. IMPRESSION: Support lines in good position.  No pneumothorax Mild left lower lobe atelectasis. Electronically Signed   By: Franchot Gallo M.D.   On: 02/13/2022 12:55   VAS US DOPPLER PRE CABG  Result Date: 02/12/2022 PREOPERATIVE VASCULAR EVALUATION Patient Name:  Teresa Marquez [facility]  Date of Exam:   02/12/2022 Medical Rec #: [id number]             Accession #:    [id number] Date of Birth: October 14, 1940              Patient Gender: F Patient Age:   81 years Exam Location:  [facility]  Procedure:      VAS US DOPPLER PRE CABG Referring Phys: Remo Lipps HENDRICKSON --------------------------------------------------------------------------------  Indications:  Pre-CABG. Risk Factors: Hyperlipidemia, Diabetes. Performing Technologist: Darlin Coco RDMS RVT  Examination Guidelines: A complete evaluation includes B-mode imaging, spectral Doppler, color Doppler, and power Doppler as needed of all accessible portions of each vessel. Bilateral testing is considered an integral part of a complete examination. Limited examinations for reoccurring indications may be performed as noted.  Right Carotid Findings: +----------+--------+--------+--------+--------+------------------+           PSV cm/sEDV cm/sStenosisDescribeComments           +----------+--------+--------+--------+--------+------------------+ CCA Prox  108     19                                         +----------+--------+--------+--------+--------+------------------+ CCA Distal96      19                                         +----------+--------+--------+--------+--------+------------------+ ICA Prox  62      16                      intimal thickening +----------+--------+--------+--------+--------+------------------+ ICA Mid   71      19                                         +----------+--------+--------+--------+--------+------------------+ ICA Distal64      13                                         +----------+--------+--------+--------+--------+------------------+ ECA       117     6                                          +----------+--------+--------+--------+--------+------------------+ +----------+--------+-------+----------------+------------+           PSV cm/sEDV cmsDescribe        Arm Pressure +----------+--------+-------+----------------+------------+ [id number]            Multiphasic, WNL             +----------+--------+-------+----------------+------------+  +---------+--------+--+--------+-+---------+ VertebralPSV cm/s41EDV  cm/s9Antegrade +---------+--------+--+--------+-+---------+ Left Carotid Findings: +----------+--------+--------+--------+--------+------------------+           PSV cm/sEDV cm/sStenosisDescribeComments           +----------+--------+--------+--------+--------+------------------+ CCA Prox  104     14                                         +----------+--------+--------+--------+--------+------------------+ CCA Distal85      17                                         +----------+--------+--------+--------+--------+------------------+ ICA Prox  74      20                      intimal thickening +----------+--------+--------+--------+--------+------------------+ ICA Mid   79      21                                         +----------+--------+--------+--------+--------+------------------+ ICA Distal74      20                                         +----------+--------+--------+--------+--------+------------------+ ECA       124     9                                          +----------+--------+--------+--------+--------+------------------+ +----------+--------+--------+----------------+------------+ SubclavianPSV cm/sEDV cm/sDescribe        Arm Pressure +----------+--------+--------+----------------+------------+           159             Multiphasic, WNL             +----------+--------+--------+----------------+------------+ +---------+--------+--+--------+--+---------+ VertebralPSV cm/s48EDV cm/s10Antegrade +---------+--------+--+--------+--+---------+  ABI Findings: +--------+------------------+-----+---------+--------+ Right   Rt Pressure (mmHg)IndexWaveform Comment  +--------+------------------+-----+---------+--------+ [id number]                    triphasic         +--------+------------------+-----+---------+--------+ PTA                             triphasic         +--------+------------------+-----+---------+--------+ DP                             triphasic         +--------+------------------+-----+---------+--------+ +--------+------------------+-----+---------+-------+ Left    Lt Pressure (mmHg)IndexWaveform Comment +--------+------------------+-----+---------+-------+ [id number]                    triphasic        +--------+------------------+-----+---------+-------+ PTA                            triphasic        +--------+------------------+-----+---------+-------+ DP  triphasic        +--------+------------------+-----+---------+-------+  Right Doppler Findings: +--------+--------+-----+---------+--------+ Site    PressureIndexDoppler  Comments +--------+--------+-----+---------+--------+ [id number]          triphasic         +--------+--------+-----+---------+--------+ Radial               triphasic         +--------+--------+-----+---------+--------+ Ulnar                triphasic         +--------+--------+-----+---------+--------+  Left Doppler Findings: +--------+--------+-----+---------+--------+ Site    PressureIndexDoppler  Comments +--------+--------+-----+---------+--------+ [id number]          triphasic         +--------+--------+-----+---------+--------+ Radial               triphasic         +--------+--------+-----+---------+--------+ Ulnar                triphasic         +--------+--------+-----+---------+--------+  Summary: Right Carotid: The extracranial vessels were near-normal with only minimal wall                thickening or plaque. Left Carotid: The extracranial vessels were near-normal with only minimal wall               thickening or plaque. Vertebrals:  Bilateral vertebral arteries demonstrate antegrade flow. Subclavians: Normal flow hemodynamics were seen in bilateral subclavian              arteries. Right Upper  Extremity: Doppler waveforms remain within normal limits with right radial compression. Doppler waveforms remain within normal limits with right ulnar compression. Left Upper Extremity: Doppler waveforms remain within normal limits with left radial compression. Doppler waveforms decrease 50% with left ulnar compression.  Electronically signed by Deitra Mayo MD on 02/12/2022 at 7:15:47 PM.    Final    DG Chest Port 1 View  Result Date: 02/12/2022 CLINICAL DATA:  Chest pain EXAM: PORTABLE CHEST 1 VIEW COMPARISON:  07/12/2004 FINDINGS: Transverse diameter of hot he is in the upper limits of normal. There are no signs of pulmonary edema or focal pulmonary consolidation. Lateral CP angles are indistinct. There is no pneumothorax. Degenerative changes are noted in both shoulders. IMPRESSION: There are no signs of pulmonary edema or focal pulmonary consolidation. Lateral costophrenic angles are indistinct which may be due to pleural thickening or minimal effusions. Electronically Signed   By: Elmer Picker M.D.   On: 02/12/2022 16:09   ECHOCARDIOGRAM COMPLETE  Result Date: 02/12/2022    ECHOCARDIOGRAM REPORT   Patient Name:   Teresa Marquez [facility] Date of Exam: 02/12/2022 Medical Rec #:  [id number]            Height: Accession #:    [id number]           Weight: Date of Birth:  04-15-1941             BSA: Patient Age:    21 years             BP:           128/62 mmHg Patient Gender: F                    HR:           68 bpm. Exam Location:  Inpatient Procedure: 2D Echo, Cardiac Doppler and Color Doppler Indications:    Coronary artery disease  History:  Patient has no prior history of Echocardiogram examinations.                 Risk Factors:Diabetes, Dyslipidemia and Sleep Apnea.  Sonographer:    Jefferey Pica Referring Phys: Christopher  1. There is a very small area of apicolateral hypokinesis. Left ventricular ejection fraction, by estimation, is 60 to 65%. The left  ventricle has normal function. The left ventricle demonstrates regional wall motion abnormalities (see scoring diagram/findings for description). There is mild concentric left ventricular hypertrophy. Left ventricular diastolic parameters are consistent with Grade I diastolic dysfunction (impaired relaxation). Elevated left atrial pressure.  2. Right ventricular systolic function is normal. The right ventricular size is normal. Tricuspid regurgitation signal is inadequate for assessing PA pressure.  3. The mitral valve is normal in structure. No evidence of mitral valve regurgitation. No evidence of mitral stenosis.  4. The aortic valve is normal in structure. Aortic valve regurgitation is not visualized. No aortic stenosis is present.  5. The inferior vena cava is dilated in size with >50% respiratory variability, suggesting right atrial pressure of 8 mmHg. FINDINGS  Left Ventricle: There is a very small area of apicolateral hypokinesis. Left ventricular ejection fraction, by estimation, is 60 to 65%. The left ventricle has normal function. The left ventricle demonstrates regional wall motion abnormalities. The left  ventricular internal cavity size was normal in size. There is mild concentric left ventricular hypertrophy. Left ventricular diastolic parameters are consistent with Grade I diastolic dysfunction (impaired relaxation). Elevated left atrial pressure.  LV Wall Scoring: The apical lateral segment is hypokinetic. Right Ventricle: The right ventricular size is normal. No increase in right ventricular wall thickness. Right ventricular systolic function is normal. Tricuspid regurgitation signal is inadequate for assessing PA pressure. Left Atrium: Left atrial size was normal in size. Right Atrium: Right atrial size was normal in size. Pericardium: Trivial pericardial effusion is present. The pericardial effusion is circumferential. Mitral Valve: The mitral valve is normal in structure. No evidence of mitral  valve regurgitation. No evidence of mitral valve stenosis. Tricuspid Valve: The tricuspid valve is normal in structure. Tricuspid valve regurgitation is not demonstrated. No evidence of tricuspid stenosis. Aortic Valve: The aortic valve is normal in structure. Aortic valve regurgitation is not visualized. No aortic stenosis is present. Aortic valve peak gradient measures 16.8 mmHg. Pulmonic Valve: The pulmonic valve was normal in structure. Pulmonic valve regurgitation is not visualized. No evidence of pulmonic stenosis. Aorta: The aortic root is normal in size and structure. Venous: The inferior vena cava is dilated in size with greater than 50% respiratory variability, suggesting right atrial pressure of 8 mmHg. IAS/Shunts: No atrial level shunt detected by color flow Doppler.  LEFT VENTRICLE PLAX 2D LVIDd:         4.50 cm   Diastology LVIDs:         3.30 cm   LV e' medial:  4.50 cm/s LV PW:         1.30 cm   LV e' lateral: 3.90 cm/s LV IVS:        1.30 cm LVOT diam:     2.10 cm LV SV:         11198 LVOT Area:     3.46 cm  IVC IVC diam: 2.10 cm LEFT ATRIUM LA diam:      3.40 cm LA Vol (A2C): 48.1 ml LA Vol (A4C): 41.7 ml  AORTIC VALVE  PULMONIC VALVE AV Area (Vmax): 2.42 cm     PV Vmax:       0.72 m/s AV Vmax:        205.00 cm/s  PV Peak grad:  2.1 mmHg AV Peak Grad:   16.8 mmHg LVOT Vmax:      143.00 cm/s LVOT Vmean:     92.000 cm/s LVOT VTI:       32.330 m MITRAL VALVE MV PHT:      96.00 msec SHUNTS                         Systemic VTI:  32.33 m                         Systemic Diam: 2.10 cm Sanda Klein MD Electronically signed by Sanda Klein MD Signature Date/Time: 02/12/2022/11:35:50 AM    Final     Cardiac Studies   LHC 02/11/22: 1.  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  Patent left main with mild distal left main stenosis 3.  Patent left circumflex with mild diffuse nonobstructive disease 4.  Patent RCA (large, dominant vessel) with mild diffuse plaquing  but no areas of high-grade stenosis 5.  Mild hypokinesis of the distal anterior wall with preserved overall LVEF estimated at 55 to 65%   Recommend: Resume heparin 2 hours after TR band off, TCTS consultation for consideration of CABG  TTE 02/12/22: IMPRESSIONS     1. There is a very small area of apicolateral hypokinesis. Left  ventricular ejection fraction, by estimation, is 60 to 65%. The left  ventricle has normal function. The left ventricle demonstrates regional  wall motion abnormalities (see scoring  diagram/findings for description). There is mild concentric left  ventricular hypertrophy. Left ventricular diastolic parameters are  consistent with Grade I diastolic dysfunction (impaired relaxation).  Elevated left atrial pressure.   2. Right ventricular systolic function is normal. The right ventricular  size is normal. Tricuspid regurgitation signal is inadequate for assessing  PA pressure.   3. The mitral valve is normal in structure. No evidence of mitral valve  regurgitation. No evidence of mitral stenosis.   4. The aortic valve is normal in structure. Aortic valve regurgitation is  not visualized. No aortic stenosis is present.   5. The inferior vena cava is dilated in size with >50% respiratory  variability, suggesting right atrial pressure of 8   Patient Profile     81 y.o. female with history of HTN, HLD, GERD, DMII, and NAFLD who initially presented to clinic with chest pain found to have CTO of LAD on CCTA with plan for outpatient cath, but she developed recurrent chest pain at preprocedure clinic visit with anterior TWI on ECG prompting urgent transfer to [facility] for emergent coronary angiography. Cath demonstrated subtotal ostial LAD disease now s/p CABG with LIMA to LAD.   Assessment & Plan    Critical Ostial LAD Disease: - Patient with recently diagnosed LAD disease on coronary CTA that was obtained for exertional chest pain. She was planned for outpatient cath,  but presented to clinic during pre-procedure visit with chest tightness found to have dynamic ECG changes concerning for ischemia.  - She was transferred to [facility] for urgent coronary angiography which revealed critical ostial LAD disease - Now s/p CABG x1 with LIMA-LAD - Post-op care per CT surgery - Continue ASA '81mg'$  daily - Continue lipitor '80mg'$  daily   HLD: -  Continue lipitor '80mg'$  daily {Are we signing off today?:[id number]}  For questions or updates, please contact [place] Please consult [email or web address] for contact info under        Signed, Freada Bergeron, MD  02/13/2022, 8:51 PM

## 2022-02-13 NOTE — Hospital Course (Addendum)
HPI: This is an 81 year old woman with a past medical history significant for hypertension, hyperlipidemia, type 2 diabetes without complication, reflux, fatty liver disease, obesity, and obstructive sleep apnea.  Over the past couple of months she has been having increased shortness of breath and chest discomfort with activity.  She has had some relief with belching at times.  Occasionally will radiate down her arms.  She was referred to Dr. Johney Frame.  A coronary CT suggested possible total occlusion of the proximal to mid LAD.  She was seen in the cardiology office and was complaining of chest pain.  She was admitted for further evaluation.  She underwent cardiac catheterization which showed a tight ostial LAD stenosis not amenable to percutaneous intervention.  She was started on IV heparin and admitted. She has been pain-free since admission.  Dr. Roxan Hockey discussed the need for single vessel coronary artery bypass. We will attempt to do this off pump. Potential risks, benefits, and complications of the surgery were discussed with the patient and she agreed to proceed with surgery.  Hospital Course:  Patient underwent a CABG x 1 on pump. She was transferred from the OR to [facility] ICU in stable condition. Of note, she did go into a fib with RVR while closing so she was given a bolus and put on an Amiodarone drip.  She was extubated the evening of surgery.  She was maintaining NSR and was converted to oral Amiodarone on 02/15/2022.  She underwent removal of chest tubes and arterial lines without difficulty.  She was hyponatremic and this was closely monitored.  She is responding well to diuretics.  Function has remained within normal limits.  She does have an expected acute blood loss anemia which is stabilized.  She had issues with nausea which slowly improved continues to be an issue and she is gradually tolerating a diet.  She was transferred to the floor telemetry unit for ongoing rehabilitation on  postoperative day #2.  Since that time she has continued to show excellent overall progress.  She continues to maintain sinus rhythm.  Pacing wires have been removed.  She is making arrangements for her family to assist her with care at home.  She has 2 sons that she believes will be able to manage this.  She requested home PT and evaluation was obtained.  Her nausea improved.  She is maintaining NSR.  Her surgical incisions are healing without evidence of infection.  She is medically stable for discharge home today.

## 2022-02-13 NOTE — Progress Notes (Signed)
Pt wanting to hold off on cpap for the night.

## 2022-02-13 NOTE — Anesthesia Procedure Notes (Signed)
Procedure Name: Intubation Date/Time: 02/13/2022 8:56 AM  Performed by: Barrington Ellison, CRNAPre-anesthesia Checklist: Patient identified, Emergency Drugs available, Suction available and Patient being monitored Patient Re-evaluated:Patient Re-evaluated prior to induction Oxygen Delivery Method: Circle System Utilized Preoxygenation: Pre-oxygenation with 100% oxygen Induction Type: IV induction Ventilation: Mask ventilation without difficulty and Oral airway inserted - appropriate to patient size Laryngoscope Size: Mac and 3 Grade View: Grade II Tube type: Oral Tube size: 8.0 mm Number of attempts: 2 Airway Equipment and Method: Stylet and Oral airway Placement Confirmation: ETT inserted through vocal cords under direct vision, positive ETCO2 and breath sounds checked- equal and bilateral Secured at: 21 cm Tube secured with: Tape Dental Injury: Teeth and Oropharynx as per pre-operative assessment

## 2022-02-13 NOTE — Anesthesia Procedure Notes (Signed)
Central Venous Catheter Insertion Performed by: Roderic Palau, MD, anesthesiologist Start/End8/03/2022 7:35 AM, 02/13/2022 7:50 AM Patient location: Pre-op. Preanesthetic checklist: patient identified, IV checked, site marked, risks and benefits discussed, surgical consent, monitors and equipment checked, pre-op evaluation, timeout performed and anesthesia consent Position: Trendelenburg Lidocaine 1% used for infiltration and patient sedated Hand hygiene performed , maximum sterile barriers used  and Seldinger technique used Catheter size: 9 Fr Total catheter length 10. Central line was placed.MAC introducer Procedure performed using ultrasound guided technique. Ultrasound Notes:anatomy identified, needle tip was noted to be adjacent to the nerve/plexus identified, no ultrasound evidence of intravascular and/or intraneural injection and image(s) printed for medical record Attempts: 1 Following insertion, line sutured, dressing applied and Biopatch. Post procedure assessment: blood return through all ports, free fluid flow and no air  Patient tolerated the procedure well with no immediate complications.

## 2022-02-13 NOTE — Interval H&P Note (Signed)
History and Physical Interval Note:  02/13/2022 7:35 AM  Teresa Marquez  has presented today for surgery, with the diagnosis of CAD.  The various methods of treatment have been discussed with the patient and family. After consideration of risks, benefits and other options for treatment, the patient has consented to  Procedure(s): CORONARY ARTERY BYPASS GRAFTING (CABG), POSSIBLE OFF PUMP (N/A) TRANSESOPHAGEAL ECHOCARDIOGRAM (TEE) (N/A) as a surgical intervention.  The patient's history has been reviewed, patient examined, no change in status, stable for surgery.  I have reviewed the patient's chart and labs.  Questions were answered to the patient's satisfaction.     Melrose Nakayama

## 2022-02-13 NOTE — Brief Op Note (Signed)
02/13/2022  1:11 PM  PATIENT:  Teresa Marquez  81 y.o. female  PRE-OPERATIVE DIAGNOSIS:  SINGLE VESSEL CORONARY ARTERY DISEASE  POST-OPERATIVE DIAGNOSIS:  SINGLE VESSEL CORONARY ARTERY DISEASE  PROCEDURE: TRANSESOPHAGEAL ECHOCARDIOGRAM (TEE), CORONARY ARTERY BYPASS GRAFTING (CABG) TIMES ONE USING LIMA (on pump) using LEFT INTERNAL   SURGEON:  Surgeon(s) and Role:    Melrose Nakayama, MD - Primary  PHYSICIAN ASSISTANT: Lars Pinks PA-C  ANESTHESIA:   general  EBL:  610 mL   DRAINS:  Chest tubes placed in the mediastinal and left pleural space    COUNTS CORRECT:  YES  DICTATION: .Dragon Dictation  PLAN OF CARE: Admit to inpatient   PATIENT DISPOSITION:  ICU - intubated and hemodynamically stable.   Delay start of Pharmacological VTE agent (>24hrs) due to surgical blood loss or risk of bleeding: no  BASELINE WEIGHT: 84.7 kg

## 2022-02-13 NOTE — Procedures (Signed)
Extubation Procedure Note  Patient Details:   Name: Teresa Marquez DOB: [birth year] MRN: [id number]   Airway Documentation:    Vent end date: 02/13/22 Vent end time: 1725   Evaluation  O2 sats: stable throughout Complications: No apparent complications Patient did tolerate procedure well. Bilateral Breath Sounds: Clear, Diminished   Yes  Patient extubated per rapid wean protocol. Positive cuff leak. VC 800, NIF-5 after three attempts. MD okay to proceed. Vitals are stable on 3L [place]. RN at bedside.  Herbie Saxon Rogerio Boutelle 02/13/2022, 5:29 PM

## 2022-02-13 NOTE — Discharge Summary (Addendum)
[address]       ,[place] [address]             [phone]    Physician Discharge Summary  Patient ID: Teresa Marquez MRN: [id number] DOB/AGE: 80-24-1942 80 y.o.  Admit date: 02/11/2022 Discharge date: 02/18/2022  Admission Diagnoses: Single-vessel coronary disease with unstable angina  Patient Active Problem List   Diagnosis Date Noted   Coronary artery disease 02/13/2022   Unstable angina (HCC) 02/11/2022   Elevated blood-pressure reading without diagnosis of hypertension 01/30/2022   Mixed hyperlipidemia 01/30/2022   Cervical radiculopathy 11/12/2016   Gastroesophageal reflux disease without esophagitis 10/01/2016   Obstructive sleep apnea 10/01/2016   Oral cyst 10/01/2016   Chronic rhinitis 08/07/2016   History of pituitary adenoma 08/07/2016   S/P rotator cuff repair 08/29/2015   Incomplete tear of right rotator cuff 08/03/2015   Chronic right shoulder pain 03/16/2015   Knee pain 09/09/2013   History of total hip arthroplasty 03/05/2013   Low back pain 03/05/2013   Discharge Diagnoses:  Patient Active Problem List   Diagnosis Date Noted   S/P CABG x 1 02/14/2022   Coronary artery disease 02/13/2022   Unstable angina ([facility]) 02/11/2022   Elevated blood-pressure reading without diagnosis of hypertension 01/30/2022   Mixed hyperlipidemia 01/30/2022   Cervical radiculopathy 11/12/2016   Gastroesophageal reflux disease without esophagitis 10/01/2016   Obstructive sleep apnea 10/01/2016   Oral cyst 10/01/2016   Chronic rhinitis 08/07/2016   History of pituitary adenoma 08/07/2016   S/P rotator cuff repair 08/29/2015   Incomplete tear of right rotator cuff 08/03/2015   Chronic right shoulder pain 03/16/2015   Knee pain 09/09/2013   History of total hip arthroplasty 03/05/2013   Low back pain 03/05/2013   Discharged Condition: good  HPI: This is an 81 year old woman with a past medical history significant for hypertension,  hyperlipidemia, type 2 diabetes without complication, reflux, fatty liver disease, obesity, and obstructive sleep apnea.  Over the past couple of months she has been having increased shortness of breath and chest discomfort with activity.  She has had some relief with belching at times.  Occasionally will radiate down her arms.  She was referred to Dr. Johney Frame.  A coronary CT suggested possible total occlusion of the proximal to mid LAD.  She was seen in the cardiology office and was complaining of chest pain.  She was admitted for further evaluation.  She underwent cardiac catheterization which showed a tight ostial LAD stenosis not amenable to percutaneous intervention.  She was started on IV heparin and admitted. She has been pain-free since admission.  Dr. Roxan Hockey discussed the need for single vessel coronary artery bypass. We will attempt to do this off pump. Potential risks, benefits, and complications of the surgery were discussed with the patient and she agreed to proceed with surgery.  Hospital Course:  Patient underwent a CABG x 1 on pump. She was transferred from the OR to [facility] ICU in stable condition. Of note, she did go into a fib with RVR while closing so she was given a bolus and put on an Amiodarone drip.  She was extubated the evening of surgery.  She was maintaining NSR and was converted to oral Amiodarone on 02/15/2022.  She underwent removal of chest tubes and arterial lines without difficulty.  She was hyponatremic and this was closely monitored.  She is responding well to diuretics.  Function has remained within normal limits.  She does have  an expected acute blood loss anemia which is stabilized.  She had issues with nausea which slowly improved continues to be an issue and she is gradually tolerating a diet.  She was transferred to the floor telemetry unit for ongoing rehabilitation on postoperative day #2.  Since that time she has continued to show excellent overall progress.  She  continues to maintain sinus rhythm.  Pacing wires have been removed.  She is making arrangements for her family to assist her with care at home.  She has 2 sons that she believes will be able to manage this.  She requested home PT and evaluation was obtained.  Her nausea improved.  She is maintaining NSR.  Her surgical incisions are healing without evidence of infection.  She is medically stable for discharge home today.  Consults: None  Significant Diagnostic Studies: angiography:   1.  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  Patent left main with mild distal left main stenosis 3.  Patent left circumflex with mild diffuse nonobstructive disease 4.  Patent RCA (large, dominant vessel) with mild diffuse plaquing but no areas of high-grade stenosis 5.  Mild hypokinesis of the distal anterior wall with preserved overall LVEF estimated at 55 to 65%   Recommend: Resume heparin 2 hours after TR band off, TCTS consultation for consideration of CABG   Treatments: surgery:  Operative Report    DATE OF PROCEDURE: 02/13/2022   PREOPERATIVE DIAGNOSIS:  Single vessel coronary disease with unstable angina.   POSTOPERATIVE DIAGNOSIS:  Single vessel coronary disease with unstable angina.   PROCEDURE:  Median sternotomy, extracorporeal circulation, coronary artery bypass grafting x1 with left internal mammary artery to LAD.   SURGEON:  Revonda Standard. Roxan Hockey, MD   ASSISTANT:  Lars Pinks PA-C  Discharge Exam: Blood pressure 131/66, pulse 77, temperature 98.5 F (36.9 C), temperature source Oral, resp. rate 17, height '5\' 4"'$  (1.626 m), weight 84.5 kg, SpO2 97 %.   General appearance: alert, cooperative, and no distress Heart: regular rate and rhythm Lungs: clear to auscultation bilaterally Abdomen: soft, non-tender; bowel sounds normal; no masses,  no organomegaly Extremities: edema trace Wound: clean and dry  Discharge Medications:  The patient has been  discharged on:   1.Beta Blocker:  Yes [ X  ]                              No   [   ]                              If No, reason:  2.Ace Inhibitor/ARB: Yes [   ]                                     No  [  x  ]                                     If No, reason: labile BP  3.Statin:   Yes Valu.Nieves   ]                  No  [   ]  If No, reason:  4.Ecasa:  Yes  [ X  ]                  No   [   ]                  If No, reason:  Patient had ACS upon admission: No  Plavix/P2Y12 inhibitor: Yes [   ]                                      No  [  x ]      Allergies as of 02/18/2022       Reactions   Bactrim [sulfamethoxazole-trimethoprim]    Heart racing   Keflet [cephalexin]    Makes me bleed   Tetracyclines & Related    Yeast infection         Medication List     TAKE these medications    acetaminophen 500 MG tablet Commonly known as: TYLENOL Take 500 mg by mouth every 6 (six) hours as needed for moderate pain.   amiodarone 200 MG tablet Commonly known as: PACERONE Take 1 tablet (200 mg total) by mouth 2 (two) times daily. X 7 days, then decrease to 200 mg daily   aspirin 81 MG chewable tablet Chew 81 mg by mouth daily.   atorvastatin 80 MG tablet Commonly known as: LIPITOR Take 1 tablet (80 mg total) by mouth daily.   cyanocobalamin 1000 MCG tablet Commonly known as: VITAMIN B12 Take 1,000 mcg daily by mouth.   ezetimibe 10 MG tablet Commonly known as: ZETIA Take 10 mg by mouth daily.   fluticasone 50 MCG/ACT nasal spray Commonly known as: FLONASE Place 2 sprays into both nostrils every evening.   folic acid 315 MCG tablet Commonly known as: FOLVITE Take 400 mcg daily by mouth.   HAIR SKIN & NAILS ADVANCED PO Take 1 tablet by mouth daily.   metoprolol tartrate 25 MG tablet Commonly known as: LOPRESSOR Take 0.5 tablets (12.5 mg total) by mouth 2 (two) times daily.   omeprazole 20 MG capsule Commonly known as: PRILOSEC Take 10 mg by  mouth every evening.   ondansetron 4 MG tablet Commonly known as: Zofran Take 1 tablet (4 mg total) by mouth every 8 (eight) hours as needed for nausea or vomiting.   REFRESH OP Place 1 drop into both eyes daily.   traMADol 50 MG tablet Commonly known as: ULTRAM Take 1 tablet (50 mg total) by mouth every 6 (six) hours as needed for moderate pain.   Turmeric 500 MG Caps Take 2 capsules by mouth daily.        Follow-up Information     Melrose Nakayama, MD. Go on 03/19/2022.   Specialty: Cardiothoracic Surgery Why: PA/LAT CXR to be taken (at [facility] which is in the same building as Dr. Leonarda Salon office) on 09/12 at 3:15 pm;Appointment time is at 3:45 pm Contact information: [place] [address] [phone]         Elgie Collard, [place]. Go on 03/07/2022.   Specialty: Cardiothoracic Surgery Why: Appointment time is at 2:20 pm Contact information: [place] [address] [phone]                 Signed:  Ellwood Handler, PA-C  02/18/2022, 7:30 AM

## 2022-02-13 NOTE — Anesthesia Procedure Notes (Signed)
Central Venous Catheter Insertion Performed by: Roderic Palau, MD, anesthesiologist Start/End8/03/2022 7:35 AM, 02/13/2022 7:50 AM Patient location: Pre-op. Preanesthetic checklist: patient identified, IV checked, site marked, risks and benefits discussed, surgical consent, monitors and equipment checked, pre-op evaluation, timeout performed and anesthesia consent Hand hygiene performed  and maximum sterile barriers used  PA cath was placed.Swan type:thermodilution PA Cath depth:45 Procedure performed without using ultrasound guided technique. Attempts: 1 Patient tolerated the procedure well with no immediate complications.

## 2022-02-13 NOTE — Op Note (Signed)
NAME: Teresa Marquez, EFAW MEDICAL RECORD NO: [id number] ACCOUNT NO: [id number] DATE OF BIRTH: 1941/04/21 FACILITY: [facility] LOCATION: [facility] PHYSICIAN: Revonda Standard. Roxan Hockey, MD  Operative Report   DATE OF PROCEDURE: 02/13/2022  PREOPERATIVE DIAGNOSIS:  Single vessel coronary disease with unstable angina.  POSTOPERATIVE DIAGNOSIS:  Single vessel coronary disease with unstable angina.  PROCEDURE:   Median sternotomy, extracorporeal circulation,  Coronary artery bypass grafting x 1  Left internal mammary artery to LAD.  SURGEON:  Revonda Standard. Roxan Hockey, MD  ASSISTANT:  Lars Pinks, PA.  ANESTHESIA:  General.  FINDINGS:  LAD small, but good quality vessel, did accept a 1.5 mm probe.  Mammary 1.5 mm good quality vessel with excellent flow.  Transesophageal echocardiography showed preserved left ventricular function with no significant valvular pathology both  pre and post-bypass.  CLINICAL NOTE:  Teresa Marquez is an 81 year old woman with multiple cardiac risk factors, who presented with progressive chest discomfort and shortness of breath.  Workup included coronary CT, which suggested total occlusion of the proximal to mid LAD.  At  catheterization she was found to have a tight ostial LAD stenosis and was referred for coronary artery bypass grafting.  The indications, risks, benefits, and alternatives were discussed in detail with the patient.  She understood and accepted the risks  and agreed to proceed.  OPERATIVE NOTE:  Teresa Marquez was brought to the preoperative holding area on 02/13/2022.  Anesthesia placed a Swan-Ganz catheter and an arterial blood pressure monitoring line.  She was taken to the operating room and anesthetized and intubated.  A Foley  catheter was placed.  Intravenous antibiotics were administered.  Transesophageal echocardiography was performed by Dr. Suzette Battiest.  Please see his separately dictated note for full details.  The chest, abdomen and legs  were prepped and draped in  the usual sterile fashion.  A timeout was performed. Median sternotomy was performed.  Initial hemostasis was obtained.  The left internal mammary artery was harvested using standard technique.  The heparin dose calculated for off-pump grafting was given prior to dividing the  distal end of the mammary artery.  There was excellent flow through the cut end of the vessel.  Sternal retractor was placed and was opened.  The pericardium was opened.  There was a small pericardial effusion.  The heart was elevated.  The LAD was relatively small vessel with a large amount of surrounding fat.  It did not appear suitable for  off-pump grafting.  The decision was made to do the procedure on bypass.  The remainder of the full heparin dose was given.  After confirming adequate anticoagulation with ACT measurement, the aorta was cannulated via concentric 2-0 Ethibond pledgeted  pursestring sutures.  A dual stage venous cannula was placed via a pursestring suture in right atrial appendage.  Cardiopulmonary bypass was initiated.  Flows were maintained per protocol.  The patient was maintained at normothermia.  The coronary artery  was inspected.  The anastomotic site was chosen.  The mammary was inspected, cut to length and the distal end was bevelled.  A temperature probe was placed in myocardial septum and cardioplegia cannula was placed in the ascending aorta.  The aorta was crossclamped. The left ventricle was emptied via the aortic root vent.  Cardiac arrest then was achieved with a combination of cold antegrade blood cardioplegia and topical iced saline.  One liter of cardioplegia was administered.  There  was a diastolic arrest after approximately 500 mL of cardioplegia and there was septal cooling to 12  degrees Celsius.  The left internal mammary artery was brought through a window in the pericardium.  It was then anastomosed end-to-side to the distal LAD.  These were both 1.5 mm  vessels of good quality.  An end-to-side anastomosis was performed with a running 8-0  Prolene suture.  At the completion of the anastomosis, the bulldog clamp was removed.  There was good hemostasis and there was rapid septal rewarming.  The mammary pedicle was tacked to the epicardial surface of the heart with 6-0 Prolene sutures.  The aortic cross clamp was removed.  The total cross clamp time was 18 minutes.  The  patient spontaneously resumed a bradycardic rhythm.  She did not require defibrillation.  Epicardial pacing wires were placed on the right ventricle and right atrium.  The patient then was weaned from cardiopulmonary bypass on the first attempt without  difficulty.  The total bypass time was 39 minutes.  The initial cardiac index was greater than 2 liters per minute per meter squared.  The transesophageal echocardiogram was unchanged from the prebypass study.  A test dose of protamine was administered and was well tolerated.  The atrial and aortic cannulae were removed.  The remainder of the protamine was administered without incident.  The chest was irrigated with warm saline.  Hemostasis was achieved.  The  pericardium was reapproximated over the aorta with interrupted 3-0 silk sutures.  Left pleural and mediastinal chest tubes were placed through separate subcostal incisions.  Sternum was closed with a combination of single and double heavy gauge stainless  steel wires. The pectoralis fascia was closed with a running #1 Vicryl suture.    At this time, the patient went into atrial fibrillation with a rapid ventricular response from 140-150 beats per minute.  She was loaded with amiodarone and then an infusion  was initiated.  She did convert to sinus bradycardia prior to leaving the operating room.    The subcutaneous tissue and skin were closed in standard fashion.  All sponge, needle and instrument counts were correct at the end of the procedure.  The patient  was taken from the  operating room to the surgical intensive care unit, intubated and in good condition.  All sponge, needle and instrument counts were correct at the end of the procedure.  Experienced assistance was necessary for this case due to surgical complexity.  Lars Pinks served as Environmental consultant.  She assisted with cannulation and decannulation, retraction of delicate tissues, exposure, suctioning and suture management as well as  wound closure.   VAI D: 02/13/2022 5:15:23 pm T: 02/13/2022 10:25:00 pm  JOB: [id number]/ [id number]

## 2022-02-13 NOTE — Transfer of Care (Signed)
Immediate Anesthesia Transfer of Care Note  Patient: Teresa Marquez  Procedure(s) Performed: CORONARY ARTERY BYPASS GRAFTING (CABG) TIMES ONE USING LIMA. (Chest) TRANSESOPHAGEAL ECHOCARDIOGRAM (TEE)  Patient Location: ICU  Anesthesia Type:General  Level of Consciousness: Patient remains intubated per anesthesia plan  Airway & Oxygen Therapy: Patient remains intubated per anesthesia plan and Patient placed on Ventilator (see vital sign flow sheet for setting)  Post-op Assessment: Report given to RN  Post vital signs: Reviewed and stable  Last Vitals:  Vitals Value Taken Time  BP 115/80   Temp    Pulse 75 02/13/22 1235  Resp 12 02/13/22 1235  SpO2 95 % 02/13/22 1235  Vitals shown include unvalidated device data.  Last Pain:  Vitals:   02/13/22 0442  TempSrc: Oral  PainSc: 0-No pain         Complications: No notable events documented.

## 2022-02-13 NOTE — Anesthesia Preprocedure Evaluation (Signed)
Anesthesia Evaluation  Patient identified by MRN, date of birth, ID band Patient awake    Reviewed: Allergy & Precautions, NPO status , Patient's Chart, lab work & pertinent test results  Airway Mallampati: II  TM Distance: >3 FB Neck ROM: Full    Dental  (+) Dental Advisory Given   Pulmonary sleep apnea ,    breath sounds clear to auscultation       Cardiovascular + angina + CAD   Rhythm:Regular Rate:Normal     Neuro/Psych  Neuromuscular disease    GI/Hepatic Neg liver ROS, GERD  ,  Endo/Other  diabetes, Type 2  Renal/GU negative Renal ROS     Musculoskeletal  (+) Arthritis ,   Abdominal   Peds  Hematology negative hematology ROS (+)   Anesthesia Other Findings   Reproductive/Obstetrics                             Lab Results  Component Value Date   WBC 9.9 02/13/2022   HGB 12.5 02/13/2022   HCT 36.4 02/13/2022   MCV 93.8 02/13/2022   PLT 242 02/13/2022   Lab Results  Component Value Date   CREATININE 0.86 02/13/2022   BUN 17 02/13/2022   NA 136 02/13/2022   K 4.4 02/13/2022   CL 103 02/13/2022   CO2 23 02/13/2022    Anesthesia Physical Anesthesia Plan  ASA: 4  Anesthesia Plan: General   Post-op Pain Management: Tylenol PO (pre-op)*   Induction: Intravenous  PONV Risk Score and Plan: 3 and Dexamethasone, Ondansetron and Treatment may vary due to age or medical condition  Airway Management Planned: Oral ETT  Additional Equipment: Arterial line, CVP, PA Cath, TEE and Ultrasound Guidance Line Placement  Intra-op Plan:   Post-operative Plan: Post-operative intubation/ventilation  Informed Consent: I have reviewed the patients History and Physical, chart, labs and discussed the procedure including the risks, benefits and alternatives for the proposed anesthesia with the patient or authorized representative who has indicated his/her understanding and acceptance.      Dental advisory given  Plan Discussed with: CRNA  Anesthesia Plan Comments:         Anesthesia Quick Evaluation

## 2022-02-13 NOTE — Discharge Instructions (Signed)

## 2022-02-14 ENCOUNTER — Encounter (HOSPITAL_COMMUNITY): Payer: Self-pay | Admitting: Thoracic Surgery (Cardiothoracic Vascular Surgery)

## 2022-02-14 ENCOUNTER — Inpatient Hospital Stay (HOSPITAL_COMMUNITY): Payer: Medicare Other

## 2022-02-14 DIAGNOSIS — Z951 Presence of aortocoronary bypass graft: Secondary | ICD-10-CM

## 2022-02-14 DIAGNOSIS — I10 Essential (primary) hypertension: Secondary | ICD-10-CM | POA: Diagnosis not present

## 2022-02-14 DIAGNOSIS — I25110 Atherosclerotic heart disease of native coronary artery with unstable angina pectoris: Secondary | ICD-10-CM | POA: Diagnosis not present

## 2022-02-14 DIAGNOSIS — E782 Mixed hyperlipidemia: Secondary | ICD-10-CM | POA: Diagnosis not present

## 2022-02-14 LAB — BASIC METABOLIC PANEL
Anion gap: 4 — ABNORMAL LOW (ref 5–15)
Anion gap: 6 (ref 5–15)
BUN: 8 mg/dL (ref 8–23)
BUN: 11 mg/dL (ref 8–23)
CO2: 24 mmol/L (ref 22–32)
CO2: 25 mmol/L (ref 22–32)
Calcium: 7.4 mg/dL — ABNORMAL LOW (ref 8.9–10.3)
Calcium: 7.9 mg/dL — ABNORMAL LOW (ref 8.9–10.3)
Chloride: 102 mmol/L (ref 98–111)
Chloride: 102 mmol/L (ref 98–111)
Creatinine, Ser: 0.78 mg/dL (ref 0.44–1.00)
Creatinine, Ser: 0.93 mg/dL (ref 0.44–1.00)
GFR, Estimated: >60 mL/min (ref >60)
GFR, Estimated: >60 mL/min (ref >60)
Glucose, Bld: 131 mg/dL — ABNORMAL HIGH (ref 70–99)
Glucose, Bld: 161 mg/dL — ABNORMAL HIGH (ref 70–99)
Potassium: 3.9 mmol/L (ref 3.5–5.1)
Potassium: 4.2 mmol/L (ref 3.5–5.1)
Sodium: 130 mmol/L — ABNORMAL LOW (ref 135–145)
Sodium: 133 mmol/L — ABNORMAL LOW (ref 135–145)

## 2022-02-14 LAB — CBC
HCT: 28.8 % — ABNORMAL LOW (ref 36.0–46.0)
HCT: 29 % — ABNORMAL LOW (ref 36.0–46.0)
Hemoglobin: 10.1 g/dL — ABNORMAL LOW (ref 12.0–15.0)
Hemoglobin: 9.9 g/dL — ABNORMAL LOW (ref 12.0–15.0)
MCH: 33.2 pg (ref 26.0–34.0)
MCH: 32.4 pg (ref 26.0–34.0)
MCHC: 35.1 g/dL (ref 30.0–36.0)
MCHC: 34.1 g/dL (ref 30.0–36.0)
MCV: 94.7 fL (ref 80.0–100.0)
MCV: 94.8 fL (ref 80.0–100.0)
Platelets: 183 10*3/uL (ref 150–400)
Platelets: 155 10*3/uL (ref 150–400)
RBC: 3.04 MIL/uL — ABNORMAL LOW (ref 3.87–5.11)
RBC: 3.06 MIL/uL — ABNORMAL LOW (ref 3.87–5.11)
RDW: 12.4 % (ref 11.5–15.5)
RDW: 12.8 % (ref 11.5–15.5)
WBC: 13.4 10*3/uL — ABNORMAL HIGH (ref 4.0–10.5)
WBC: 13 10*3/uL — ABNORMAL HIGH (ref 4.0–10.5)
nRBC: 0 % (ref 0.0–0.2)
nRBC: 0 % (ref 0.0–0.2)

## 2022-02-14 LAB — MAGNESIUM
Magnesium: 2.4 mg/dL (ref 1.7–2.4)
Magnesium: 2.4 mg/dL (ref 1.7–2.4)

## 2022-02-14 LAB — GLUCOSE, CAPILLARY
Glucose-Capillary: 115 mg/dL — ABNORMAL HIGH (ref 70–99)
Glucose-Capillary: 116 mg/dL — ABNORMAL HIGH (ref 70–99)
Glucose-Capillary: 121 mg/dL — ABNORMAL HIGH (ref 70–99)
Glucose-Capillary: 125 mg/dL — ABNORMAL HIGH (ref 70–99)
Glucose-Capillary: 127 mg/dL — ABNORMAL HIGH (ref 70–99)
Glucose-Capillary: 129 mg/dL — ABNORMAL HIGH (ref 70–99)
Glucose-Capillary: 138 mg/dL — ABNORMAL HIGH (ref 70–99)
Glucose-Capillary: 146 mg/dL — ABNORMAL HIGH (ref 70–99)
Glucose-Capillary: 167 mg/dL — ABNORMAL HIGH (ref 70–99)

## 2022-02-14 LAB — ECHO INTRAOPERATIVE TEE
AV Mean grad: 5 mmHg
AV Peak grad: 8.6 mmHg
Ao pk vel: 1.47 m/s
Height: 64 in
Weight: 2988.8 oz

## 2022-02-14 MED ORDER — INSULIN ASPART 100 UNIT/ML IJ SOLN
0.0000 [IU] | INTRAMUSCULAR | Status: DC
  Administered 2022-02-14: 4 [IU] via SUBCUTANEOUS
  Administered 2022-02-14 – 2022-02-15 (×3): 2 [IU] via SUBCUTANEOUS

## 2022-02-14 MED ORDER — INSULIN DETEMIR 100 UNIT/ML ~~LOC~~ SOLN
30.0000 [IU] | Freq: Two times a day (BID) | SUBCUTANEOUS | Status: DC
  Administered 2022-02-14 (×2): 30 [IU] via SUBCUTANEOUS
  Filled 2022-02-14 (×4): qty 0.3

## 2022-02-14 MED ORDER — ENOXAPARIN SODIUM 40 MG/0.4ML IJ SOSY
40.0000 mg | PREFILLED_SYRINGE | Freq: Every day | INTRAMUSCULAR | Status: DC
  Administered 2022-02-14 – 2022-02-17 (×4): 40 mg via SUBCUTANEOUS
  Filled 2022-02-14 (×4): qty 0.4

## 2022-02-14 MED FILL — Heparin Sodium (Porcine) Inj 1000 Unit/ML: Qty: 1000 | Status: AC

## 2022-02-14 MED FILL — Potassium Chloride Inj 2 mEq/ML: INTRAVENOUS | Qty: 40 | Status: AC

## 2022-02-14 MED FILL — Lidocaine HCl Local Preservative Free (PF) Inj 2%: INTRAMUSCULAR | Qty: 15 | Status: AC

## 2022-02-14 NOTE — Progress Notes (Signed)
1 Day Post-Op Procedure(s) (LRB): CORONARY ARTERY BYPASS GRAFTING (CABG) TIMES ONE USING LIMA. (N/A) TRANSESOPHAGEAL ECHOCARDIOGRAM (TEE) (N/A) Subjective: Some discomfort, "better than yesterday"  Objective: Vital signs in last 24 hours: Temp:  [96.3 F (35.7 C)-99 F (37.2 C)] 98.6 F (37 C) (08/10 0700) Pulse Rate:  [61-80] 80 (08/10 0700) Cardiac Rhythm: Atrial paced (08/10 0000) Resp:  [11-24] 16 (08/10 0700) BP: (90-132)/(49-82) 104/60 (08/10 0645) SpO2:  [91 %-100 %] 95 % (08/10 0700) Arterial Line BP: (92-133)/(61-101) 94/87 (08/09 2230) FiO2 (%):  [40 %-50 %] 40 % (08/09 1625) Weight:  [86.4 kg] 86.4 kg (08/10 0600)  Hemodynamic parameters for last 24 hours: PAP: (19-45)/(7-32) 33/20 CO:  [3 L/min-4.9 L/min] 3.8 L/min CI:  [1.6 L/min/m2-2.6 L/min/m2] 2 L/min/m2  Intake/Output from previous day: 08/09 0701 - 08/10 0700 In: 6525.7 [I.V.:3996; Blood:300; IV Piggyback:2229.7] Out: 4515 [Urine:3585; Blood:610; Chest Tube:320] Intake/Output this shift: No intake/output data recorded.  General appearance: alert, cooperative, and no distress Neurologic: intact Heart: regular rate and rhythm Lungs: diminished breath sounds bibasilar Abdomen: normal findings: soft, non-tender  Lab Results: Recent Labs    02/13/22 1815 02/14/22 0413  WBC 15.4* 13.4*  HGB 10.6*  10.5* 10.1*  HCT 30.6*  31.0* 28.8*  PLT 189 183   BMET:  Recent Labs    02/13/22 1815 02/14/22 0413  NA 134*  135 130*  K 4.1  4.1 3.9  CL 105 102  CO2 23 24  GLUCOSE 137* 131*  BUN 10 8  CREATININE 0.68 0.78  CALCIUM 7.5* 7.4*    PT/INR:  Recent Labs    02/13/22 1246  LABPROT 16.6*  INR 1.4*   ABG    Component Value Date/Time   PHART 7.311 (L) 02/13/2022 1815   HCO3 22.2 02/13/2022 1815   TCO2 24 02/13/2022 1815   ACIDBASEDEF 4.0 (H) 02/13/2022 1815   O2SAT 95 02/13/2022 1815   CBG (last 3)  Recent Labs    02/14/22 0041 02/14/22 0140 02/14/22 0355  GLUCAP 127* 116* 138*     Assessment/Plan: S/P Procedure(s) (LRB): CORONARY ARTERY BYPASS GRAFTING (CABG) TIMES ONE USING LIMA. (N/A) TRANSESOPHAGEAL ECHOCARDIOGRAM (TEE) (N/A) POD # 1 Doing well NEURO- intact CV- in Sr on IV amiodarone for intraop A fib  Good hemodynamics  ASA, statin, beta blocker  Dc Swan  Continue IV amiodarone today RESP_ IS for atelectasis RENAL- creatinine normal, weight up 4 pounds  Mild hyponatremia ENDO- CBG controlled with insulin drip  Transtion to Levemir + SSI GI- advance diet as tolerated SCD + enoxaparin for DVT prophylaxis Cardiac rehab Dc chest tubes  LOS: 3 days    Melrose Nakayama 02/14/2022

## 2022-02-14 NOTE — Progress Notes (Signed)
Progress Note  Patient Name: Teresa Marquez Date of Encounter: 02/15/2022  [facility] Cardiologist: Freada Bergeron, MD   Subjective  Feeling better this morning. Nausea improved. Ambulated the hallway.   Weaned off pressors BP well controlled; HR 60-70s in NSR Na low 130, cr stable 0.89 Awaiting transfer to 4E  Inpatient Medications    Scheduled Meds:  acetaminophen  1,000 mg Oral Q6H   Or   acetaminophen (TYLENOL) oral liquid 160 mg/5 mL  1,000 mg Per Tube Q6H   amiodarone  400 mg Oral BID   aspirin EC  325 mg Oral Daily   Or   aspirin  324 mg Per Tube Daily   atorvastatin  80 mg Oral Daily   bisacodyl  10 mg Oral Daily   Or   bisacodyl  10 mg Rectal Daily   Chlorhexidine Gluconate Cloth  6 each Topical Daily   docusate sodium  200 mg Oral Daily   enoxaparin (LOVENOX) injection  40 mg Subcutaneous QHS   ezetimibe  10 mg Oral Daily   fluticasone  2 spray Each Nare QPM   folic acid  0.5 mg Oral Daily   furosemide  40 mg Oral Daily   insulin aspart  0-15 Units Subcutaneous TID WC   metoprolol tartrate  12.5 mg Oral BID   Or   metoprolol tartrate  12.5 mg Per Tube BID   pantoprazole  40 mg Oral Daily   polyvinyl alcohol  1 drop Both Eyes Daily   potassium chloride  20 mEq Oral BID   sodium chloride flush  3 mL Intravenous Q12H   Continuous Infusions:  sodium chloride Stopped (02/14/22 1012)   sodium chloride     sodium chloride 20 mL/hr at 02/15/22 0700   amiodarone 30 mg/hr (02/15/22 0700)   lactated ringers     lactated ringers Stopped (02/13/22 1230)   lactated ringers Stopped (02/14/22 1238)   nitroGLYCERIN Stopped (02/13/22 1302)   phenylephrine (NEO-SYNEPHRINE) Adult infusion Stopped (02/14/22 1429)   PRN Meds: sodium chloride, lactated ringers, metoprolol tartrate, midazolam, morphine injection, ondansetron (ZOFRAN) IV, mouth rinse, oxyCODONE, sodium chloride flush, traMADol   Vital Signs    Vitals:   02/15/22 0400 02/15/22 0530  02/15/22 0600 02/15/22 0700  BP:   (!) 120/52 116/72  Pulse:   72 74  Resp:   20 16  Temp: (!) 97.4 F (36.3 C)   97.9 F (36.6 C)  TempSrc: Oral     SpO2:   95% 96%  Weight:  88.2 kg    Height:        Intake/Output Summary (Last 24 hours) at 02/15/2022 0809 Last data filed at 02/15/2022 0700 Gross per 24 hour  Intake 713.53 ml  Output 705 ml  Net 8.53 ml      02/15/2022    5:30 AM 02/14/2022    6:00 AM 02/11/2022    5:04 PM  Last 3 Weights  Weight (lbs) 194 lb 7.1 oz 190 lb 7.6 oz 186 lb 12.8 oz  Weight (kg) 88.2 kg 86.4 kg 84.732 kg      Telemetry    NSR - Personally Reviewed  ECG    No new tracing- Personally Reviewed  Physical Exam   GEN: Sitting in a chair Neck: Right IJ in place Cardiac: RRR, no murmurs, rubs, or gallops.  Respiratory: Faint crackles at bases GI: Soft, nontender, non-distended  MS: Trace ankle edema, warm Neuro:  Nonfocal  Psych: Normal affect   Labs  High Sensitivity Troponin:  No results for input(s): "TROPONINIHS" in the last 720 hours.   Chemistry Recent Labs  Lab 02/13/22 1815 02/14/22 0413 02/14/22 1700 02/15/22 0430  NA 134*  135 130* 133* 130*  K 4.1  4.1 3.9 4.2 3.9  CL 105 102 102 100  CO2 '23 24 25 26  '$ GLUCOSE 137* 131* 161* 125*  BUN '10 8 11 12  '$ CREATININE 0.68 0.78 0.93 0.89  CALCIUM 7.5* 7.4* 7.9* 7.8*  MG 2.8* 2.4 2.4  --   GFRNONAA >60 >60 >60 >60  ANIONGAP 6 4* 6 4*    Lipids  Recent Labs  Lab 02/12/22 0808  CHOL 174  TRIG 160*  HDL 36*  LDLCALC 106*  CHOLHDL 4.8    Hematology Recent Labs  Lab 02/14/22 0413 02/14/22 1700 02/15/22 0430  WBC 13.4* 13.0* 12.1*  RBC 3.04* 3.06* 2.93*  HGB 10.1* 9.9* 9.5*  HCT 28.8* 29.0* 27.9*  MCV 94.7 94.8 95.2  MCH 33.2 32.4 32.4  MCHC 35.1 34.1 34.1  RDW 12.4 12.8 13.0  PLT 183 155 147*   Thyroid No results for input(s): "TSH", "FREET4" in the last 168 hours.  BNPNo results for input(s): "BNP", "PROBNP" in the last 168 hours.  DDimer No results for  input(s): "DDIMER" in the last 168 hours.   Radiology    DG Chest Port 1 View  Result Date: 02/15/2022 CLINICAL DATA:  Chest tube EXAM: PORTABLE CHEST 1 VIEW COMPARISON:  Yesterday FINDINGS: Low volume chest with indistinct density at the bases. Chest tube and Swan-Ganz removal. There could be a left pleural effusion. No definite or significant pneumothorax. Cardiomegaly. IMPRESSION: Low volume chest with mildly increased atelectasis. No visible pneumothorax. Electronically Signed   By: Jorje Guild M.D.   On: 02/15/2022 06:51   DG Chest Port 1 View  Result Date: 02/14/2022 CLINICAL DATA:  Status post CABG. EXAM: PORTABLE CHEST 1 VIEW COMPARISON:  Chest x-ray from yesterday. FINDINGS: Interval removal of the endotracheal and enteric tubes. Unchanged right internal jugular Swan-Ganz catheter, mediastinal drain, and left chest tube. Stable cardiomediastinal silhouette status post CABG. Unchanged small left pleural effusion and mild left basilar atelectasis. The right lung is clear. No pneumothorax. No acute osseous abnormality. IMPRESSION: 1. Interval removal of the endotracheal and enteric tubes. Otherwise stable small left pleural effusion and left basilar atelectasis. Electronically Signed   By: Titus Dubin M.D.   On: 02/14/2022 08:14   DG Chest Port 1 View  Result Date: 02/13/2022 CLINICAL DATA:  Post CABG. EXAM: PORTABLE CHEST 1 VIEW COMPARISON:  02/12/2022 FINDINGS: Postop CABG. Endotracheal tube in good position. Swan-Ganz catheter in the main pulmonary artery. NG tube in the stomach with the side hole near the GE junction. Mediastinal drain in place. Left chest tube in place. Negative for pneumothorax. Mild left lower lobe atelectasis. Minimal left effusion. Right lung clear. IMPRESSION: Support lines in good position.  No pneumothorax Mild left lower lobe atelectasis. Electronically Signed   By: Franchot Gallo M.D.   On: 02/13/2022 12:55    Cardiac Studies   LHC 02/11/22: 1.  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  Patent left main with mild distal left main stenosis 3.  Patent left circumflex with mild diffuse nonobstructive disease 4.  Patent RCA (large, dominant vessel) with mild diffuse plaquing but no areas of high-grade stenosis 5.  Mild hypokinesis of the distal anterior wall with preserved overall LVEF estimated at 55 to 65%   Recommend: Resume  heparin 2 hours after TR band off, TCTS consultation for consideration of CABG  TTE 02/12/22: IMPRESSIONS   1. There is a very small area of apicolateral hypokinesis. Left  ventricular ejection fraction, by estimation, is 60 to 65%. The left  ventricle has normal function. The left ventricle demonstrates regional  wall motion abnormalities (see scoring  diagram/findings for description). There is mild concentric left  ventricular hypertrophy. Left ventricular diastolic parameters are  consistent with Grade I diastolic dysfunction (impaired relaxation).  Elevated left atrial pressure.   2. Right ventricular systolic function is normal. The right ventricular  size is normal. Tricuspid regurgitation signal is inadequate for assessing  PA pressure.   3. The mitral valve is normal in structure. No evidence of mitral valve  regurgitation. No evidence of mitral stenosis.   4. The aortic valve is normal in structure. Aortic valve regurgitation is  not visualized. No aortic stenosis is present.   5. The inferior vena cava is dilated in size with >50% respiratory  variability, suggesting right atrial pressure of 8   Patient Profile     81 y.o. female with history of HTN, HLD, GERD, DMII, and NAFLD who initially presented to clinic with chest pain found to have CTO of LAD on CCTA with plan for outpatient cath, but she developed recurrent chest pain at preprocedure clinic visit with anterior TWI on ECG prompting urgent transfer to [facility] for emergent coronary angiography. Cath demonstrated subtotal ostial LAD  disease now s/p CABG with LIMA to LAD.   Assessment & Plan    Critical Ostial LAD Disease: - Patient with recently diagnosed LAD disease on coronary CTA that was obtained for exertional chest pain. She was planned for outpatient cath, but presented to clinic during pre-procedure visit with chest tightness found to have dynamic ECG changes concerning for ischemia.  - She was transferred to [facility] for urgent coronary angiography which revealed critical ostial LAD disease - Now s/p CABG x1 with LIMA-LAD - Post-op care per CT surgery - Off pressors - Started on lasix '40mg'$  PO daily - Continue metop 12.'5mg'$  BID - Continue ASA '81mg'$  daily - Continue lipitor '80mg'$  daily   HLD: -Continue lipitor '80mg'$  daily      For questions or updates, please contact [facility] Please consult [email or web address] for contact info under        Signed, Freada Bergeron, MD  02/15/2022, 8:09 AM

## 2022-02-14 NOTE — Progress Notes (Signed)
      [address]       [place] [address]             [phone]      POD # 1 CABG  Resting comfortably, some nausea  BP (!) 105/52   Pulse 85   Temp 98.8 F (37.1 C) (Oral)   Resp 17   Ht '5\' 4"'$  (1.626 m)   Wt 86.4 kg   SpO2 (!) 89%   BMI 32.70 kg/m  2L [place] 96% sat  Intake/Output Summary (Last 24 hours) at 02/14/2022 1733 Last data filed at 02/14/2022 1000 Gross per 24 hour  Intake 1810.03 ml  Output 1555 ml  Net 255.03 ml   CBG mildly elevated  Remo Lipps C. Roxan Hockey, MD [facility] [phone]

## 2022-02-14 NOTE — Anesthesia Postprocedure Evaluation (Signed)
Anesthesia Post Note  Patient: Teresa Marquez  Procedure(s) Performed: CORONARY ARTERY BYPASS GRAFTING (CABG) TIMES ONE USING LIMA. (Chest) TRANSESOPHAGEAL ECHOCARDIOGRAM (TEE)     Patient location during evaluation: SICU Anesthesia Type: General Level of consciousness: sedated Pain management: pain level controlled Vital Signs Assessment: post-procedure vital signs reviewed and stable Respiratory status: patient remains intubated per anesthesia plan Cardiovascular status: stable Postop Assessment: no apparent nausea or vomiting Anesthetic complications: no   No notable events documented.  Last Vitals:  Vitals:   02/14/22 1100 02/14/22 1115  BP: (!) 105/53 (!) 114/54  Pulse: 67 70  Resp: 14 14  Temp:    SpO2: 94% 94%    Last Pain:  Vitals:   02/14/22 1004  TempSrc:   PainSc: 2                  Tiajuana Amass

## 2022-02-14 NOTE — Progress Notes (Signed)
RT made aware that a line wasn't pulling back, RT attempted to save a line but a line unable to be saved. A line pulled, gauze placed. RN aware.

## 2022-02-15 ENCOUNTER — Inpatient Hospital Stay (HOSPITAL_COMMUNITY): Payer: Medicare Other

## 2022-02-15 DIAGNOSIS — Z951 Presence of aortocoronary bypass graft: Secondary | ICD-10-CM | POA: Diagnosis not present

## 2022-02-15 DIAGNOSIS — I25110 Atherosclerotic heart disease of native coronary artery with unstable angina pectoris: Secondary | ICD-10-CM | POA: Diagnosis not present

## 2022-02-15 LAB — CBC
HCT: 27.9 % — ABNORMAL LOW (ref 36.0–46.0)
Hemoglobin: 9.5 g/dL — ABNORMAL LOW (ref 12.0–15.0)
MCH: 32.4 pg (ref 26.0–34.0)
MCHC: 34.1 g/dL (ref 30.0–36.0)
MCV: 95.2 fL (ref 80.0–100.0)
Platelets: 147 K/uL — ABNORMAL LOW (ref 150–400)
RBC: 2.93 MIL/uL — ABNORMAL LOW (ref 3.87–5.11)
RDW: 13 % (ref 11.5–15.5)
WBC: 12.1 K/uL — ABNORMAL HIGH (ref 4.0–10.5)
nRBC: 0 % (ref 0.0–0.2)

## 2022-02-15 LAB — BASIC METABOLIC PANEL WITH GFR
Anion gap: 4 — ABNORMAL LOW (ref 5–15)
BUN: 12 mg/dL (ref 8–23)
CO2: 26 mmol/L (ref 22–32)
Calcium: 7.8 mg/dL — ABNORMAL LOW (ref 8.9–10.3)
Chloride: 100 mmol/L (ref 98–111)
Creatinine, Ser: 0.89 mg/dL (ref 0.44–1.00)
GFR, Estimated: >60 mL/min
Glucose, Bld: 125 mg/dL — ABNORMAL HIGH (ref 70–99)
Potassium: 3.9 mmol/L (ref 3.5–5.1)
Sodium: 130 mmol/L — ABNORMAL LOW (ref 135–145)

## 2022-02-15 LAB — GLUCOSE, CAPILLARY
Glucose-Capillary: 112 mg/dL — ABNORMAL HIGH (ref 70–99)
Glucose-Capillary: 122 mg/dL — ABNORMAL HIGH (ref 70–99)
Glucose-Capillary: 124 mg/dL — ABNORMAL HIGH (ref 70–99)
Glucose-Capillary: 128 mg/dL — ABNORMAL HIGH (ref 70–99)
Glucose-Capillary: 147 mg/dL — ABNORMAL HIGH (ref 70–99)
Glucose-Capillary: 154 mg/dL — ABNORMAL HIGH (ref 70–99)

## 2022-02-15 MED ORDER — SODIUM CHLORIDE 0.9 % IV SOLN: 250.0000 mL | INTRAVENOUS | Status: DC | PRN

## 2022-02-15 MED ORDER — SODIUM CHLORIDE 0.9% FLUSH: 3.0000 mL | INTRAVENOUS | Status: DC | PRN

## 2022-02-15 MED ORDER — ~~LOC~~ CARDIAC SURGERY, PATIENT & FAMILY EDUCATION: Freq: Once | Status: AC

## 2022-02-15 MED ORDER — MAGNESIUM HYDROXIDE 400 MG/5ML PO SUSP: 30.0000 mL | Freq: Every day | ORAL | Status: DC | PRN

## 2022-02-15 MED ORDER — OXYCODONE HCL 5 MG PO TABS: 5.0000 mg | ORAL_TABLET | ORAL | Status: DC | PRN

## 2022-02-15 MED ORDER — FUROSEMIDE 40 MG PO TABS
40.0000 mg | ORAL_TABLET | Freq: Every day | ORAL | Status: AC
  Administered 2022-02-15 – 2022-02-17 (×3): 40 mg via ORAL
  Filled 2022-02-15 (×3): qty 1

## 2022-02-15 MED ORDER — INSULIN ASPART 100 UNIT/ML IJ SOLN
0.0000 [IU] | Freq: Three times a day (TID) | INTRAMUSCULAR | Status: DC
  Administered 2022-02-15 (×2): 2 [IU] via SUBCUTANEOUS
  Administered 2022-02-16: 3 [IU] via SUBCUTANEOUS
  Administered 2022-02-17: 2 [IU] via SUBCUTANEOUS
  Administered 2022-02-17: 3 [IU] via SUBCUTANEOUS
  Administered 2022-02-18: 1 [IU] via SUBCUTANEOUS

## 2022-02-15 MED ORDER — POTASSIUM CHLORIDE CRYS ER 20 MEQ PO TBCR
20.0000 meq | EXTENDED_RELEASE_TABLET | Freq: Two times a day (BID) | ORAL | Status: AC
Start: 2022-02-15 — End: 2022-02-17
  Administered 2022-02-15 – 2022-02-17 (×6): 20 meq via ORAL
  Filled 2022-02-15 (×6): qty 1

## 2022-02-15 MED ORDER — EZETIMIBE 10 MG PO TABS
10.0000 mg | ORAL_TABLET | Freq: Every day | ORAL | Status: DC
  Administered 2022-02-15 – 2022-02-18 (×4): 10 mg via ORAL
  Filled 2022-02-15 (×4): qty 1

## 2022-02-15 MED ORDER — GUAIFENESIN ER 600 MG PO TB12
1200.0000 mg | ORAL_TABLET | Freq: Two times a day (BID) | ORAL | Status: DC
  Administered 2022-02-15 – 2022-02-18 (×4): 1200 mg via ORAL
  Filled 2022-02-15 (×5): qty 2

## 2022-02-15 MED ORDER — SODIUM CHLORIDE 0.9% FLUSH
3.0000 mL | Freq: Two times a day (BID) | INTRAVENOUS | Status: DC
Start: 2022-02-15 — End: 2022-02-18
  Administered 2022-02-15 – 2022-02-18 (×5): 3 mL via INTRAVENOUS

## 2022-02-15 MED ORDER — AMIODARONE HCL 200 MG PO TABS
400.0000 mg | ORAL_TABLET | Freq: Two times a day (BID) | ORAL | Status: DC
  Administered 2022-02-15 – 2022-02-17 (×5): 400 mg via ORAL
  Filled 2022-02-15 (×5): qty 2

## 2022-02-15 MED ORDER — ALUM & MAG HYDROXIDE-SIMETH 200-200-20 MG/5ML PO SUSP: 15.0000 mL | Freq: Four times a day (QID) | ORAL | Status: DC | PRN

## 2022-02-15 MED ORDER — LACTULOSE 10 GM/15ML PO SOLN
30.0000 g | Freq: Once | ORAL | Status: DC
  Filled 2022-02-15: qty 45

## 2022-02-15 NOTE — Progress Notes (Addendum)
2 Days Post-Op Procedure(s) (LRB): CORONARY ARTERY BYPASS GRAFTING (CABG) TIMES ONE USING LIMA. (N/A) TRANSESOPHAGEAL ECHOCARDIOGRAM (TEE) (N/A) Subjective: Feels better this morning, nausea improved  Objective: Vital signs in last 24 hours: Temp:  [97.4 F (36.3 C)-99 F (37.2 C)] 97.4 F (36.3 C) (08/11 0400) Pulse Rate:  [65-91] 74 (08/11 0700) Cardiac Rhythm: Atrial paced (08/10 0800) Resp:  [9-20] 16 (08/11 0700) BP: (95-131)/(52-93) 116/72 (08/11 0700) SpO2:  [89 %-98 %] 96 % (08/11 0700) Weight:  [88.2 kg] 88.2 kg (08/11 0530)  Hemodynamic parameters for last 24 hours: PAP: (22-37)/(17-22) 37/22  Intake/Output from previous day: 08/10 0701 - 08/11 0700 In: 694.4 [I.V.:694.4] Out: 735 [Urine:635; Chest Tube:100] Intake/Output this shift: No intake/output data recorded.  General appearance: alert, cooperative, and no distress Neurologic: intact Heart: regular rate and rhythm Lungs: diminished breath sounds bibasilar Abdomen: normal findings: soft, non-tender  Lab Results: Recent Labs    02/14/22 1700 02/15/22 0430  WBC 13.0* 12.1*  HGB 9.9* 9.5*  HCT 29.0* 27.9*  PLT 155 147*   BMET:  Recent Labs    02/14/22 1700 02/15/22 0430  NA 133* 130*  K 4.2 3.9  CL 102 100  CO2 25 26  GLUCOSE 161* 125*  BUN 11 12  CREATININE 0.93 0.89  CALCIUM 7.9* 7.8*    PT/INR:  Recent Labs    02/13/22 1246  LABPROT 16.6*  INR 1.4*   ABG    Component Value Date/Time   PHART 7.311 (L) 02/13/2022 1815   HCO3 22.2 02/13/2022 1815   TCO2 24 02/13/2022 1815   ACIDBASEDEF 4.0 (H) 02/13/2022 1815   O2SAT 95 02/13/2022 1815   CBG (last 3)  Recent Labs    02/15/22 0044 02/15/22 0448 02/15/22 0659  GLUCAP 124* 112* 128*    Assessment/Plan: S/P Procedure(s) (LRB): CORONARY ARTERY BYPASS GRAFTING (CABG) TIMES ONE USING LIMA. (N/A) TRANSESOPHAGEAL ECHOCARDIOGRAM (TEE) (N/A) POD # 2 Doing well NEURO- intact CV- in SR on amiodarone and Lopressor  ASA,  statin RESP- IS for basilar atelectasis RENAL- creatinine normal  Weight up a few pounds from preop  Mild hyponatremia- monitor GI- nausea improved, eating breakfast ENDO- CBG well controlled, stop levemir  Change SSI to [facility] and HS Anemia secondary to ABL- mild, follow SCD + enoxaparin for DVT prophylaxis Cardiac rehab DC central line and Foley Transfer to 4E  LOS: 4 days    Melrose Nakayama 02/15/2022

## 2022-02-15 NOTE — Progress Notes (Signed)
Progress Note  Patient Name: Teresa Marquez Date of Encounter: 02/16/2022  [facility] Cardiologist: Freada Bergeron, MD   Subjective   Feels better this morning. Had bad nausea last night. Has not had BM since Tuesday.   Inpatient Medications    Scheduled Meds:  acetaminophen  1,000 mg Oral Q6H   Or   acetaminophen (TYLENOL) oral liquid 160 mg/5 mL  1,000 mg Per Tube Q6H   amiodarone  400 mg Oral BID   aspirin EC  325 mg Oral Daily   Or   aspirin  324 mg Per Tube Daily   atorvastatin  80 mg Oral Daily   bisacodyl  10 mg Oral Daily   Or   bisacodyl  10 mg Rectal Daily   Chlorhexidine Gluconate Cloth  6 each Topical Daily   docusate sodium  200 mg Oral Daily   enoxaparin (LOVENOX) injection  40 mg Subcutaneous QHS   ezetimibe  10 mg Oral Daily   fluticasone  2 spray Each Nare QPM   folic acid  0.5 mg Oral Daily   furosemide  40 mg Oral Daily   guaiFENesin  1,200 mg Oral BID   insulin aspart  0-15 Units Subcutaneous TID WC   lactulose  30 g Oral Once   metoprolol tartrate  12.5 mg Oral BID   Or   metoprolol tartrate  12.5 mg Per Tube BID   pantoprazole  40 mg Oral Daily   polyethylene glycol  17 g Oral Daily   polyvinyl alcohol  1 drop Both Eyes Daily   potassium chloride  20 mEq Oral BID   sodium chloride flush  3 mL Intravenous Q12H   Continuous Infusions:  sodium chloride     PRN Meds: sodium chloride, alum & mag hydroxide-simeth, magnesium hydroxide, metoprolol tartrate, ondansetron (ZOFRAN) IV, mouth rinse, oxyCODONE, sodium chloride flush, traMADol   Vital Signs    Vitals:   02/15/22 2250 02/16/22 0434 02/16/22 0639 02/16/22 0828  BP: (!) 136/59 129/63  124/61  Pulse: 79 71  82  Resp: '20 16  14  '$ Temp: 98.2 F (36.8 C) 97.9 F (36.6 C)  98.3 F (36.8 C)  TempSrc: Oral Oral  Oral  SpO2: 94% 99%  93%  Weight:   86.1 kg   Height:        Intake/Output Summary (Last 24 hours) at 02/16/2022 1037 Last data filed at 02/15/2022 1826 Gross  per 24 hour  Intake 36.7 ml  Output 500 ml  Net -463.3 ml      02/16/2022    6:39 AM 02/15/2022    5:30 AM 02/14/2022    6:00 AM  Last 3 Weights  Weight (lbs) 189 lb 12.8 oz 194 lb 7.1 oz 190 lb 7.6 oz  Weight (kg) 86.093 kg 88.2 kg 86.4 kg      Telemetry    NSR - Personally Reviewed  ECG    No new tracing- Personally Reviewed  Physical Exam   GEN: Sitting in a chair Neck: Supple Cardiac: RRR, no murmurs, rubs, or gallops.  Respiratory: Diminished at the bases GI: Soft, nontender, non-distended  MS: Trace edema, warm Neuro:  Nonfocal  Psych: Normal affect   Labs    High Sensitivity Troponin:  No results for input(s): "TROPONINIHS" in the last 720 hours.   Chemistry Recent Labs  Lab 02/13/22 1815 02/14/22 0413 02/14/22 1700 02/15/22 0430 02/16/22 0208  NA 134*  135 130* 133* 130* 132*  K 4.1  4.1 3.9 4.2  3.9 4.3  CL 105 102 102 100 99  CO2 '23 24 25 26 26  '$ GLUCOSE 137* 131* 161* 125* 125*  BUN '10 8 11 12 14  '$ CREATININE 0.68 0.78 0.93 0.89 0.76  CALCIUM 7.5* 7.4* 7.9* 7.8* 8.0*  MG 2.8* 2.4 2.4  --   --   GFRNONAA >60 >60 >60 >60 >60  ANIONGAP 6 4* 6 4* 7    Lipids  Recent Labs  Lab 02/12/22 0808  CHOL 174  TRIG 160*  HDL 36*  LDLCALC 106*  CHOLHDL 4.8    Hematology Recent Labs  Lab 02/14/22 1700 02/15/22 0430 02/16/22 0208  WBC 13.0* 12.1* 12.6*  RBC 3.06* 2.93* 2.98*  HGB 9.9* 9.5* 9.6*  HCT 29.0* 27.9* 28.1*  MCV 94.8 95.2 94.3  MCH 32.4 32.4 32.2  MCHC 34.1 34.1 34.2  RDW 12.8 13.0 12.7  PLT 155 147* 175   Thyroid No results for input(s): "TSH", "FREET4" in the last 168 hours.  BNPNo results for input(s): "BNP", "PROBNP" in the last 168 hours.  DDimer No results for input(s): "DDIMER" in the last 168 hours.   Radiology    DG Chest 2 View  Result Date: 02/16/2022 CLINICAL DATA:  Unstable angina EXAM: CHEST - 2 VIEW COMPARISON:  February 15, 2022 FINDINGS: A small to moderate left pleural effusion and a small right pleural  effusion are identified. Opacity under the left effusion is likely atelectasis. No other changes. IMPRESSION: Bilateral pleural effusions, left greater than right as above with underlying atelectasis. No other change. Electronically Signed   By: Dorise Bullion III M.D.   On: 02/16/2022 08:35   DG Chest Port 1 View  Result Date: 02/15/2022 CLINICAL DATA:  Chest tube EXAM: PORTABLE CHEST 1 VIEW COMPARISON:  Yesterday FINDINGS: Low volume chest with indistinct density at the bases. Chest tube and Swan-Ganz removal. There could be a left pleural effusion. No definite or significant pneumothorax. Cardiomegaly. IMPRESSION: Low volume chest with mildly increased atelectasis. No visible pneumothorax. Electronically Signed   By: Jorje Guild M.D.   On: 02/15/2022 06:51    Cardiac Studies   LHC 02/11/22: 1.  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  Patent left main with mild distal left main stenosis 3.  Patent left circumflex with mild diffuse nonobstructive disease 4.  Patent RCA (large, dominant vessel) with mild diffuse plaquing but no areas of high-grade stenosis 5.  Mild hypokinesis of the distal anterior wall with preserved overall LVEF estimated at 55 to 65%   Recommend: Resume heparin 2 hours after TR band off, TCTS consultation for consideration of CABG  TTE 02/12/22: IMPRESSIONS   1. There is a very small area of apicolateral hypokinesis. Left  ventricular ejection fraction, by estimation, is 60 to 65%. The left  ventricle has normal function. The left ventricle demonstrates regional  wall motion abnormalities (see scoring  diagram/findings for description). There is mild concentric left  ventricular hypertrophy. Left ventricular diastolic parameters are  consistent with Grade I diastolic dysfunction (impaired relaxation).  Elevated left atrial pressure.   2. Right ventricular systolic function is normal. The right ventricular  size is normal. Tricuspid  regurgitation signal is inadequate for assessing  PA pressure.   3. The mitral valve is normal in structure. No evidence of mitral valve  regurgitation. No evidence of mitral stenosis.   4. The aortic valve is normal in structure. Aortic valve regurgitation is  not visualized. No aortic stenosis is present.   5.  The inferior vena cava is dilated in size with >50% respiratory  variability, suggesting right atrial pressure of 8   Patient Profile     81 y.o. female with history of HTN, HLD, GERD, DMII, and NAFLD who initially presented to clinic with chest pain found to have CTO of LAD on CCTA with plan for outpatient cath, but she developed recurrent chest pain at preprocedure clinic visit with anterior TWI on ECG prompting urgent transfer to [facility] for emergent coronary angiography. Cath demonstrated subtotal ostial LAD disease now s/p CABG with LIMA to LAD.   Assessment & Plan    Critical Ostial LAD Disease: - Patient with recently diagnosed LAD disease on coronary CTA that was obtained for exertional chest pain. She was planned for outpatient cath, but presented to clinic during pre-procedure visit with chest tightness found to have dynamic ECG changes concerning for ischemia.  - She was transferred to [facility] for urgent coronary angiography which revealed critical ostial LAD disease - Now s/p CABG x1 with LIMA-LAD - Post-op care per CT surgery - On lasix '40mg'$  PO daily; wt downtrending - Continue metop 12.'5mg'$  BID - Continue ASA '81mg'$  daily - Continue lipitor '80mg'$  daily   HLD: -Continue lipitor '80mg'$  daily      For questions or updates, please contact [facility] Please consult [email or web address] for contact info under        Signed, Freada Bergeron, MD  02/16/2022, 10:37 AM

## 2022-02-15 NOTE — Progress Notes (Signed)
Pt arrived from ..2H..., A/ox .4..pt denies any pain, MD aware,CCMD called. CHG bath given,no further needs at this time   

## 2022-02-15 NOTE — Progress Notes (Signed)
   02/15/22 1647  Vitals  Temp 98.3 F (36.8 C)  Temp Source Oral  BP (!) 135/90  MAP (mmHg) 103  BP Location Left Arm  BP Method Automatic  Patient Position (if appropriate) Lying  Pulse Rate 74  Pulse Rate Source Monitor  ECG Heart Rate 75  Resp 18  Level of Consciousness  Level of Consciousness Alert  MEWS COLOR  MEWS Score Color Green  Oxygen Therapy  SpO2 97 %  O2 Device Room Air  Pain Assessment  Pain Scale 0-10  Pain Score 0  MEWS Score  MEWS Temp 0  MEWS Systolic 0  MEWS Pulse 0  MEWS RR 0  MEWS LOC 0  MEWS Score 0

## 2022-02-15 NOTE — Progress Notes (Signed)
Patient transferred to room 21 on 4E by wheelchair with this RN, on tele and 2L o2.  Alert with no distress noted. Medicated for nausea prior to leaving 2H. Gaspar Bidding, RN at bedside to receive patient.

## 2022-02-16 ENCOUNTER — Inpatient Hospital Stay (HOSPITAL_COMMUNITY): Payer: Medicare Other

## 2022-02-16 DIAGNOSIS — I25110 Atherosclerotic heart disease of native coronary artery with unstable angina pectoris: Secondary | ICD-10-CM | POA: Diagnosis not present

## 2022-02-16 DIAGNOSIS — Z951 Presence of aortocoronary bypass graft: Secondary | ICD-10-CM | POA: Diagnosis not present

## 2022-02-16 LAB — GLUCOSE, CAPILLARY
Glucose-Capillary: 108 mg/dL — ABNORMAL HIGH (ref 70–99)
Glucose-Capillary: 156 mg/dL — ABNORMAL HIGH (ref 70–99)
Glucose-Capillary: 136 mg/dL — ABNORMAL HIGH (ref 70–99)
Glucose-Capillary: 193 mg/dL — ABNORMAL HIGH (ref 70–99)
Glucose-Capillary: 166 mg/dL — ABNORMAL HIGH (ref 70–99)

## 2022-02-16 LAB — BASIC METABOLIC PANEL
Anion gap: 7 (ref 5–15)
BUN: 14 mg/dL (ref 8–23)
CO2: 26 mmol/L (ref 22–32)
Calcium: 8 mg/dL — ABNORMAL LOW (ref 8.9–10.3)
Chloride: 99 mmol/L (ref 98–111)
Creatinine, Ser: 0.76 mg/dL (ref 0.44–1.00)
GFR, Estimated: >60 mL/min (ref >60)
Glucose, Bld: 125 mg/dL — ABNORMAL HIGH (ref 70–99)
Potassium: 4.3 mmol/L (ref 3.5–5.1)
Sodium: 132 mmol/L — ABNORMAL LOW (ref 135–145)

## 2022-02-16 LAB — CBC
HCT: 28.1 % — ABNORMAL LOW (ref 36.0–46.0)
Hemoglobin: 9.6 g/dL — ABNORMAL LOW (ref 12.0–15.0)
MCH: 32.2 pg (ref 26.0–34.0)
MCHC: 34.2 g/dL (ref 30.0–36.0)
MCV: 94.3 fL (ref 80.0–100.0)
Platelets: 175 10*3/uL (ref 150–400)
RBC: 2.98 MIL/uL — ABNORMAL LOW (ref 3.87–5.11)
RDW: 12.7 % (ref 11.5–15.5)
WBC: 12.6 10*3/uL — ABNORMAL HIGH (ref 4.0–10.5)
nRBC: 0 % (ref 0.0–0.2)

## 2022-02-16 MED ORDER — POLYETHYLENE GLYCOL 3350 17 G PO PACK
17.0000 g | PACK | Freq: Every day | ORAL | Status: DC
  Administered 2022-02-16: 17 g via ORAL
  Filled 2022-02-16 (×2): qty 1

## 2022-02-16 NOTE — Progress Notes (Addendum)
[address]       [place] [address]             [phone]      3 Days Post-Op Procedure(s) (LRB): CORONARY ARTERY BYPASS GRAFTING (CABG) TIMES ONE USING LIMA. (N/A) TRANSESOPHAGEAL ECHOCARDIOGRAM (TEE) (N/A) Subjective: Nausea yesterday, has improved and able to eat a little  Objective: Vital signs in last 24 hours: Temp:  [97.9 F (36.6 C)-98.4 F (36.9 C)] 98.3 F (36.8 C) (08/12 0828) Pulse Rate:  [67-82] 82 (08/12 0828) Cardiac Rhythm: Normal sinus rhythm (08/11 1900) Resp:  [12-20] 14 (08/12 0828) BP: (108-145)/(57-90) 124/61 (08/12 0828) SpO2:  [93 %-99 %] 93 % (08/12 0828) Weight:  [86.1 kg] 86.1 kg (08/12 0639)  Hemodynamic parameters for last 24 hours:    Intake/Output from previous day: 08/11 0701 - 08/12 0700 In: 86.7 [I.V.:86.7] Out: 500 [Urine:500] Intake/Output this shift: No intake/output data recorded.  General appearance: alert, cooperative, and no distress Heart: regular rate and rhythm Lungs: dim left base Abdomen: benign Extremities: + edema Wound: incis healing well  Lab Results: Recent Labs    02/15/22 0430 02/16/22 0208  WBC 12.1* 12.6*  HGB 9.5* 9.6*  HCT 27.9* 28.1*  PLT 147* 175   BMET:  Recent Labs    02/15/22 0430 02/16/22 0208  NA 130* 132*  K 3.9 4.3  CL 100 99  CO2 26 26  GLUCOSE 125* 125*  BUN 12 14  CREATININE 0.89 0.76  CALCIUM 7.8* 8.0*    PT/INR:  Recent Labs    02/13/22 1246  LABPROT 16.6*  INR 1.4*   ABG    Component Value Date/Time   PHART 7.311 (L) 02/13/2022 1815   HCO3 22.2 02/13/2022 1815   TCO2 24 02/13/2022 1815   ACIDBASEDEF 4.0 (H) 02/13/2022 1815   O2SAT 95 02/13/2022 1815   CBG (last 3)  Recent Labs    02/15/22 2107 02/16/22 0625 02/16/22 0745  GLUCAP 154* 108* 156*    Meds Scheduled Meds:  acetaminophen  1,000 mg Oral Q6H   Or   acetaminophen (TYLENOL) oral liquid 160 mg/5 mL  1,000 mg Per Tube Q6H   amiodarone  400 mg Oral BID   aspirin EC   325 mg Oral Daily   Or   aspirin  324 mg Per Tube Daily   atorvastatin  80 mg Oral Daily   bisacodyl  10 mg Oral Daily   Or   bisacodyl  10 mg Rectal Daily   Chlorhexidine Gluconate Cloth  6 each Topical Daily   docusate sodium  200 mg Oral Daily   enoxaparin (LOVENOX) injection  40 mg Subcutaneous QHS   ezetimibe  10 mg Oral Daily   fluticasone  2 spray Each Nare QPM   folic acid  0.5 mg Oral Daily   furosemide  40 mg Oral Daily   guaiFENesin  1,200 mg Oral BID   insulin aspart  0-15 Units Subcutaneous TID WC   lactulose  30 g Oral Once   metoprolol tartrate  12.5 mg Oral BID   Or   metoprolol tartrate  12.5 mg Per Tube BID   pantoprazole  40 mg Oral Daily   polyvinyl alcohol  1 drop Both Eyes Daily   potassium chloride  20 mEq Oral BID   sodium chloride flush  3 mL Intravenous Q12H   Continuous Infusions:  sodium chloride     PRN Meds:.sodium chloride, alum & mag hydroxide-simeth, magnesium hydroxide, metoprolol tartrate,  ondansetron (ZOFRAN) IV, mouth rinse, oxyCODONE, sodium chloride flush, traMADol  Xrays DG Chest 2 View  Result Date: 02/16/2022 CLINICAL DATA:  Unstable angina EXAM: CHEST - 2 VIEW COMPARISON:  February 15, 2022 FINDINGS: A small to moderate left pleural effusion and a small right pleural effusion are identified. Opacity under the left effusion is likely atelectasis. No other changes. IMPRESSION: Bilateral pleural effusions, left greater than right as above with underlying atelectasis. No other change. Electronically Signed   By: Dorise Bullion III M.D.   On: 02/16/2022 08:35   DG Chest Port 1 View  Result Date: 02/15/2022 CLINICAL DATA:  Chest tube EXAM: PORTABLE CHEST 1 VIEW COMPARISON:  Yesterday FINDINGS: Low volume chest with indistinct density at the bases. Chest tube and Swan-Ganz removal. There could be a left pleural effusion. No definite or significant pneumothorax. Cardiomegaly. IMPRESSION: Low volume chest with mildly increased atelectasis. No  visible pneumothorax. Electronically Signed   By: Jorje Guild M.D.   On: 02/15/2022 06:51    Assessment/Plan: S/P Procedure(s) (LRB): CORONARY ARTERY BYPASS GRAFTING (CABG) TIMES ONE USING LIMA. (N/A) TRANSESOPHAGEAL ECHOCARDIOGRAM (TEE) (N/A) POD#3  1 afeb, VSS s bp 100-140's range, sinus rhythm 2 sats good on Ra 3 good UOP- not all recorded, voiding, weight trending lower 4 normal renal fxn, minor hyponatremia- on daily lasix 40 mg 5 expected ABLA has stabilized 6 CXR- left>right pleural effus,atx,  cont pulm hygiene and diuresis 7 CBG adeq control, Hg A1c 7.0 on no preop meds- outpatient f/u 8 cont rehab and pulm hygiene, d/c epw's- poss home tomorrow, she is trying to arrange help from family   LOS: 5 days    John Giovanni 02/16/2022   Patient looks and feels well Temporary pacing wires are out, sinus rhythm Anticipate discharge home tomorrow.  Discharge instructions reviewed with patient.  patient examined and medical record reviewed,agree with above note. Dahlia Byes 02/16/2022

## 2022-02-16 NOTE — Progress Notes (Signed)
CARDIAC REHAB PHASE I   PRE:  Rate/Rhythm: 81 SR  BP:  Supine:   Sitting: 137/64  Standing:    SaO2: 92% RA  MODE:  Ambulation: 200 ft   POST:  Rate/Rhythm: 93 SR  BP:  Supine:   Sitting: 141/74  Standing:    SaO2: 94% RA Tolerated ambulation well with assistance x1 and rolling walker. C/o nausea and was very sick on her stomach last night.  Encouraged to walk with staff 2 more times today.  Family is trying to arrange care for patient when she is discharged.  [id number] Liliane Channel RN, BSN 02/16/2022 9:22 AM

## 2022-02-16 NOTE — Progress Notes (Addendum)
   02/16/22 1955  Mobility  Activity Ambulated with assistance in hallway  Range of Motion/Exercises Active;All extremities  Level of Assistance Standby assist, set-up cues, supervision of patient - no hands on  Assistive Device Front wheel walker  Activity Response Tolerated well   Pt ambulated from her room to room 01 and back. Tolerated well. Assisted to bed after walk. Room air. NSR 90s. Third walk of the day.

## 2022-02-16 NOTE — Progress Notes (Signed)
EPW discontinued per order.   Patient tolerated well.  Sites C/D/I  Vital signs per protocol.   Bed rest initiated x 1 hour - patient educated

## 2022-02-17 DIAGNOSIS — Z951 Presence of aortocoronary bypass graft: Secondary | ICD-10-CM | POA: Diagnosis not present

## 2022-02-17 DIAGNOSIS — I25110 Atherosclerotic heart disease of native coronary artery with unstable angina pectoris: Secondary | ICD-10-CM | POA: Diagnosis not present

## 2022-02-17 LAB — GLUCOSE, CAPILLARY
Glucose-Capillary: 129 mg/dL — ABNORMAL HIGH (ref 70–99)
Glucose-Capillary: 153 mg/dL — ABNORMAL HIGH (ref 70–99)
Glucose-Capillary: 107 mg/dL — ABNORMAL HIGH (ref 70–99)
Glucose-Capillary: 149 mg/dL — ABNORMAL HIGH (ref 70–99)

## 2022-02-17 MED ORDER — AMIODARONE HCL 200 MG PO TABS
200.0000 mg | ORAL_TABLET | Freq: Two times a day (BID) | ORAL | Status: DC
  Administered 2022-02-17 – 2022-02-18 (×2): 200 mg via ORAL
  Filled 2022-02-17 (×2): qty 1

## 2022-02-17 NOTE — Progress Notes (Signed)
Progress Note  Patient Name: Teresa Marquez Date of Encounter: 02/17/2022  [facility] Cardiologist: Freada Bergeron, MD   Subjective   Feeling better. Continues to have nausea with the amiodarone. Constipation has resolved. Hoping to go home tomorrow   Inpatient Medications    Scheduled Meds:  acetaminophen  1,000 mg Oral Q6H   Or   acetaminophen (TYLENOL) oral liquid 160 mg/5 mL  1,000 mg Per Tube Q6H   amiodarone  400 mg Oral BID   aspirin EC  325 mg Oral Daily   Or   aspirin  324 mg Per Tube Daily   atorvastatin  80 mg Oral Daily   bisacodyl  10 mg Oral Daily   Or   bisacodyl  10 mg Rectal Daily   Chlorhexidine Gluconate Cloth  6 each Topical Daily   docusate sodium  200 mg Oral Daily   enoxaparin (LOVENOX) injection  40 mg Subcutaneous QHS   ezetimibe  10 mg Oral Daily   fluticasone  2 spray Each Nare QPM   folic acid  0.5 mg Oral Daily   furosemide  40 mg Oral Daily   guaiFENesin  1,200 mg Oral BID   insulin aspart  0-15 Units Subcutaneous TID WC   lactulose  30 g Oral Once   metoprolol tartrate  12.5 mg Oral BID   Or   metoprolol tartrate  12.5 mg Per Tube BID   pantoprazole  40 mg Oral Daily   polyethylene glycol  17 g Oral Daily   polyvinyl alcohol  1 drop Both Eyes Daily   potassium chloride  20 mEq Oral BID   sodium chloride flush  3 mL Intravenous Q12H   Continuous Infusions:  sodium chloride     PRN Meds: sodium chloride, alum & mag hydroxide-simeth, magnesium hydroxide, metoprolol tartrate, ondansetron (ZOFRAN) IV, mouth rinse, oxyCODONE, sodium chloride flush, traMADol   Vital Signs    Vitals:   02/16/22 1700 02/16/22 2005 02/16/22 2240 02/17/22 0340  BP: 133/70 (!) 145/64 134/64 134/62  Pulse: 77 79 93 72  Resp: '18 17 20 16  '$ Temp: 98.5 F (36.9 C) 98.1 F (36.7 C) 98.3 F (36.8 C)   TempSrc: Oral Oral Oral   SpO2: 95% 95% 97% 98%  Weight:      Height:       No intake or output data in the 24 hours ending 02/17/22  0648     02/16/2022    6:39 AM 02/15/2022    5:30 AM 02/14/2022    6:00 AM  Last 3 Weights  Weight (lbs) 189 lb 12.8 oz 194 lb 7.1 oz 190 lb 7.6 oz  Weight (kg) 86.093 kg 88.2 kg 86.4 kg      Telemetry    NSR - Personally Reviewed  ECG    No new tracing- Personally Reviewed  Physical Exam   GEN: Sitting in a chair Neck: Supple Cardiac: RRR, no murmurs, rubs, or gallops.  Respiratory: Diminished at the bases GI: Soft, nontender, non-distended  MS: Trace edema, warm Neuro:  Nonfocal  Psych: Normal affect   Labs    High Sensitivity Troponin:  No results for input(s): "TROPONINIHS" in the last 720 hours.   Chemistry Recent Labs  Lab 02/13/22 1815 02/14/22 0413 02/14/22 1700 02/15/22 0430 02/16/22 0208  NA 134*  135 130* 133* 130* 132*  K 4.1  4.1 3.9 4.2 3.9 4.3  CL 105 102 102 100 99  CO2 '23 24 25 26 26  '$ GLUCOSE 137*  131* 161* 125* 125*  BUN '10 8 11 12 14  '$ CREATININE 0.68 0.78 0.93 0.89 0.76  CALCIUM 7.5* 7.4* 7.9* 7.8* 8.0*  MG 2.8* 2.4 2.4  --   --   GFRNONAA >60 >60 >60 >60 >60  ANIONGAP 6 4* 6 4* 7     Lipids  Recent Labs  Lab 02/12/22 0808  CHOL 174  TRIG 160*  HDL 36*  LDLCALC 106*  CHOLHDL 4.8     Hematology Recent Labs  Lab 02/14/22 1700 02/15/22 0430 02/16/22 0208  WBC 13.0* 12.1* 12.6*  RBC 3.06* 2.93* 2.98*  HGB 9.9* 9.5* 9.6*  HCT 29.0* 27.9* 28.1*  MCV 94.8 95.2 94.3  MCH 32.4 32.4 32.2  MCHC 34.1 34.1 34.2  RDW 12.8 13.0 12.7  PLT 155 147* 175    Thyroid No results for input(s): "TSH", "FREET4" in the last 168 hours.  BNPNo results for input(s): "BNP", "PROBNP" in the last 168 hours.  DDimer No results for input(s): "DDIMER" in the last 168 hours.   Radiology    DG Chest 2 View  Result Date: 02/16/2022 CLINICAL DATA:  Unstable angina EXAM: CHEST - 2 VIEW COMPARISON:  February 15, 2022 FINDINGS: A small to moderate left pleural effusion and a small right pleural effusion are identified. Opacity under the left effusion  is likely atelectasis. No other changes. IMPRESSION: Bilateral pleural effusions, left greater than right as above with underlying atelectasis. No other change. Electronically Signed   By: Dorise Bullion III M.D.   On: 02/16/2022 08:35    Cardiac Studies   LHC 02/11/22: 1.  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  Patent left main with mild distal left main stenosis 3.  Patent left circumflex with mild diffuse nonobstructive disease 4.  Patent RCA (large, dominant vessel) with mild diffuse plaquing but no areas of high-grade stenosis 5.  Mild hypokinesis of the distal anterior wall with preserved overall LVEF estimated at 55 to 65%   Recommend: Resume heparin 2 hours after TR band off, TCTS consultation for consideration of CABG  TTE 02/12/22: IMPRESSIONS   1. There is a very small area of apicolateral hypokinesis. Left  ventricular ejection fraction, by estimation, is 60 to 65%. The left  ventricle has normal function. The left ventricle demonstrates regional  wall motion abnormalities (see scoring  diagram/findings for description). There is mild concentric left  ventricular hypertrophy. Left ventricular diastolic parameters are  consistent with Grade I diastolic dysfunction (impaired relaxation).  Elevated left atrial pressure.   2. Right ventricular systolic function is normal. The right ventricular  size is normal. Tricuspid regurgitation signal is inadequate for assessing  PA pressure.   3. The mitral valve is normal in structure. No evidence of mitral valve  regurgitation. No evidence of mitral stenosis.   4. The aortic valve is normal in structure. Aortic valve regurgitation is  not visualized. No aortic stenosis is present.   5. The inferior vena cava is dilated in size with >50% respiratory  variability, suggesting right atrial pressure of 8   Patient Profile     81 y.o. female with history of HTN, HLD, GERD, DMII, and NAFLD who initially presented to  clinic with chest pain found to have CTO of LAD on CCTA with plan for outpatient cath, but she developed recurrent chest pain at preprocedure clinic visit with anterior TWI on ECG prompting urgent transfer to [facility] for emergent coronary angiography. Cath demonstrated subtotal ostial LAD disease now s/p CABG  with LIMA to LAD.   Assessment & Plan    Critical Ostial LAD Disease: - Patient with recently diagnosed LAD disease on coronary CTA that was obtained for exertional chest pain. She was planned for outpatient cath, but presented to clinic during pre-procedure visit with chest tightness found to have dynamic ECG changes concerning for ischemia.  - She was transferred to [facility] for urgent coronary angiography which revealed critical ostial LAD disease - Now s/p CABG x1 with LIMA-LAD - Post-op care per CT surgery - Continue metop 12.'5mg'$  BID - Continue ASA '81mg'$  daily - Continue lipitor '80mg'$  daily - Possibly discharge tomorrow   HLD: -Continue lipitor '80mg'$  daily      For questions or updates, please contact [facility] Please consult [email or web address] for contact info under        Signed, Freada Bergeron, MD  02/17/2022, 6:48 AM

## 2022-02-17 NOTE — Progress Notes (Addendum)
[address]       [place] [address]             [phone]      4 Days Post-Op Procedure(s) (LRB): CORONARY ARTERY BYPASS GRAFTING (CABG) TIMES ONE USING LIMA. (N/A) TRANSESOPHAGEAL ECHOCARDIOGRAM (TEE) (N/A) Subjective: Nausea, most likely from amiodarone  Objective: Vital signs in last 24 hours: Temp:  [97.9 F (36.6 C)-98.5 F (36.9 C)] 98 F (36.7 C) (08/13 0723) Pulse Rate:  [72-93] 74 (08/13 0723) Cardiac Rhythm: Normal sinus rhythm (08/12 1900) Resp:  [16-20] 17 (08/13 0723) BP: (122-145)/(60-70) 144/65 (08/13 0723) SpO2:  [92 %-98 %] 93 % (08/13 0723) Weight:  [85.9 kg] 85.9 kg (08/13 0500)  Hemodynamic parameters for last 24 hours:    Intake/Output from previous day: No intake/output data recorded. Intake/Output this shift: No intake/output data recorded.  General appearance: alert, cooperative, and no distress Heart: regular rate and rhythm Lungs: min dim in bases Abdomen: benign Extremities: minor edema Wound: incis healing well  Lab Results: Recent Labs    02/15/22 0430 02/16/22 0208  WBC 12.1* 12.6*  HGB 9.5* 9.6*  HCT 27.9* 28.1*  PLT 147* 175   BMET:  Recent Labs    02/15/22 0430 02/16/22 0208  NA 130* 132*  K 3.9 4.3  CL 100 99  CO2 26 26  GLUCOSE 125* 125*  BUN 12 14  CREATININE 0.89 0.76  CALCIUM 7.8* 8.0*    PT/INR: No results for input(s): "LABPROT", "INR" in the last 72 hours. ABG    Component Value Date/Time   PHART 7.311 (L) 02/13/2022 1815   HCO3 22.2 02/13/2022 1815   TCO2 24 02/13/2022 1815   ACIDBASEDEF 4.0 (H) 02/13/2022 1815   O2SAT 95 02/13/2022 1815   CBG (last 3)  Recent Labs    02/16/22 1548 02/16/22 2132 02/17/22 0606  GLUCAP 193* 166* 129*    Meds Scheduled Meds:  acetaminophen  1,000 mg Oral Q6H   Or   acetaminophen (TYLENOL) oral liquid 160 mg/5 mL  1,000 mg Per Tube Q6H   amiodarone  400 mg Oral BID   aspirin EC  325 mg Oral Daily   Or   aspirin  324 mg Per Tube  Daily   atorvastatin  80 mg Oral Daily   bisacodyl  10 mg Oral Daily   Or   bisacodyl  10 mg Rectal Daily   Chlorhexidine Gluconate Cloth  6 each Topical Daily   docusate sodium  200 mg Oral Daily   enoxaparin (LOVENOX) injection  40 mg Subcutaneous QHS   ezetimibe  10 mg Oral Daily   fluticasone  2 spray Each Nare QPM   folic acid  0.5 mg Oral Daily   guaiFENesin  1,200 mg Oral BID   insulin aspart  0-15 Units Subcutaneous TID WC   lactulose  30 g Oral Once   metoprolol tartrate  12.5 mg Oral BID   Or   metoprolol tartrate  12.5 mg Per Tube BID   pantoprazole  40 mg Oral Daily   polyethylene glycol  17 g Oral Daily   polyvinyl alcohol  1 drop Both Eyes Daily   potassium chloride  20 mEq Oral BID   sodium chloride flush  3 mL Intravenous Q12H   Continuous Infusions:  sodium chloride     PRN Meds:.sodium chloride, alum & mag hydroxide-simeth, magnesium hydroxide, metoprolol tartrate, ondansetron (ZOFRAN) IV, mouth rinse, oxyCODONE, sodium chloride flush, traMADol  Xrays DG Chest 2  View  Result Date: 02/16/2022 CLINICAL DATA:  Unstable angina EXAM: CHEST - 2 VIEW COMPARISON:  February 15, 2022 FINDINGS: A small to moderate left pleural effusion and a small right pleural effusion are identified. Opacity under the left effusion is likely atelectasis. No other changes. IMPRESSION: Bilateral pleural effusions, left greater than right as above with underlying atelectasis. No other change. Electronically Signed   By: Dorise Bullion III M.D.   On: 02/16/2022 08:35    Assessment/Plan: S/P Procedure(s) (LRB): CORONARY ARTERY BYPASS GRAFTING (CABG) TIMES ONE USING LIMA. (N/A) TRANSESOPHAGEAL ECHOCARDIOGRAM (TEE) (N/A) POD#4   1 afeb, VSS S BP 120's-140's 2 sats good on RA 3 weight about 1 KG >preop  4 no new CXR/labs 5 family arrangements to help her should be in place by tomorrow- sons helping her 6 hopefully nausea will improve but may need to consider stopping amio   LOS: 6 days     John Giovanni PA-C Pager [phone] 02/17/2022   Remains nauseated dose of amiodarone reduced now 200 mg twice daily.  Patient anxious to walk in the hallway.  Ready to go home tomorrow with family but has requested home PT since she lives usually alone.  patient examined and medical record reviewed,agree with above note. Dahlia Byes 02/17/2022

## 2022-02-17 NOTE — Progress Notes (Signed)
Pt stated she doesn't want to wear CPAP, pt stated she wore 02 last night and did ok.

## 2022-02-17 NOTE — Progress Notes (Signed)
Mobility Specialist Progress Note    02/17/22 1653  Mobility  Activity Ambulated with assistance in hallway  Level of Assistance Contact guard assist, steadying assist  Assistive Device Front wheel walker  Distance Ambulated (ft) 400 ft  Activity Response Tolerated well  $Mobility charge 1 Mobility   Pt received in chair and agreeable. No complaints on walk. Returned to chair with sister present.    Hildred Alamin Mobility Specialist

## 2022-02-18 DIAGNOSIS — I200 Unstable angina: Secondary | ICD-10-CM

## 2022-02-18 LAB — GLUCOSE, CAPILLARY: Glucose-Capillary: 126 mg/dL — ABNORMAL HIGH (ref 70–99)

## 2022-02-18 MED ORDER — METOPROLOL TARTRATE 25 MG PO TABS: 12.5000 mg | ORAL_TABLET | Freq: Two times a day (BID) | ORAL | 3 refills | Status: DC

## 2022-02-18 MED ORDER — AMIODARONE HCL 200 MG PO TABS: 200.0000 mg | ORAL_TABLET | Freq: Two times a day (BID) | ORAL | 1 refills | Status: DC

## 2022-02-18 MED ORDER — TRAMADOL HCL 50 MG PO TABS: 50.0000 mg | ORAL_TABLET | Freq: Four times a day (QID) | ORAL | 0 refills | Status: DC | PRN

## 2022-02-18 MED ORDER — ONDANSETRON HCL 4 MG PO TABS: 4.0000 mg | ORAL_TABLET | Freq: Three times a day (TID) | ORAL | 0 refills | Status: DC | PRN

## 2022-02-18 MED ORDER — ATORVASTATIN CALCIUM 80 MG PO TABS: 80.0000 mg | ORAL_TABLET | Freq: Every day | ORAL | 3 refills | Status: DC

## 2022-02-18 MED FILL — Sodium Chloride IV Soln 0.9%: INTRAVENOUS | Qty: 2000 | Status: AC

## 2022-02-18 MED FILL — Lidocaine HCl Local Soln Prefilled Syringe 100 MG/5ML (2%): INTRAMUSCULAR | Qty: 5 | Status: AC

## 2022-02-18 MED FILL — Electrolyte-R (PH 7.4) Solution: INTRAVENOUS | Qty: 3000 | Status: AC

## 2022-02-18 MED FILL — Sodium Bicarbonate IV Soln 8.4%: INTRAVENOUS | Qty: 50 | Status: AC

## 2022-02-18 MED FILL — Mannitol IV Soln 20%: INTRAVENOUS | Qty: 500 | Status: AC

## 2022-02-18 NOTE — Care Management Important Message (Signed)
Important Message  Patient Details  Name: Teresa Marquez MRN: [id number] Date of Birth: 1941/06/30   Medicare Important Message Given:  Yes     Shelda Altes 02/18/2022, 9:26 AM

## 2022-02-18 NOTE — TOC Transition Note (Addendum)
Transition of Care (TOC) - CM/SW Discharge Note Marvetta Gibbons RN, BSN Transitions of Care Unit 4E- RN Case Manager See Treatment Team for direct phone #    Patient Details  Name: WINSLOW VERRILL MRN: [id number] Date of Birth: August 08, 1940  Transition of Care [facility]) CM/SW Contact:  Dawayne Patricia, RN Phone Number: 02/18/2022, 12:13 PM   Clinical Narrative:    Pt stable for transition home today, son to come provide transportation home.  Order placed for HHPT.  CM in to speak with pt at bedside (son also arrived to bedside)- List provided for [facility] choice Per CMS guidelines from medicare.gov website with star ratings (copy placed in shadow chart) - after review of list pt has selected [facility]) for her [place] needs-  Per pt she has RW at home- no other DME needs noted. Pt also shared that she has some private duty assistance in the home that her other son has arranged.   Address, phone # and PCP all confirmed in epic.   Call made to [facility] w/ [facility] for HHPT referral- referral has been accepted.    Final next level of care: [place] Barriers to Discharge: No Barriers Identified   Patient Goals and CMS Choice Patient states their goals for this hospitalization and ongoing recovery are:: return home and get stronger CMS Medicare.gov Compare Post Acute Care list provided to:: Patient Choice offered to / list presented to : Patient, Adult Children  Discharge Placement                 Home w/ [facility]      Discharge Plan and Services   Discharge Planning Services: CM Consult Post Acute Care Choice: Home Health          DME Arranged: N/A DME Agency: NA       HH Arranged: PT [place] Agency: [place] ([facility]) Date HH Agency Contacted: 02/18/22 Time Guadalupe Guerra: 1212 Representative spoke with at [place]: [place] Determinants of Health ([place]) Interventions     Readmission Risk Interventions     No data to  display

## 2022-02-18 NOTE — Progress Notes (Signed)
CARDIAC REHAB PHASE I   PRE:  Rate/Rhythm: 81 SR  BP:  Sitting: 137/67      SaO2: 98 RA  MODE:  Ambulation: 270 ft   POST:  Rate/Rhythm: 98 SR  BP:  Sitting: 143/63      SaO2: 98  Pt ambulated using front wheel walker. Tolerated well taking a few standing breaks, standby assist only. Back to room to chair with call bell and bedside table in reach. [facility] home education including site care, move in tub sheet, sternal precautions, heart healthy diabetic diet, IS use, home needs, exercise guidelines, restrictions, and CRP2. All questions and concerns addressed. Plan for discharge today. Will refer to [facility] for CRP2.   [id number]  Vanessa Barbara, RN BSN 02/18/2022 9:21 AM

## 2022-02-18 NOTE — Progress Notes (Signed)
      [address]       [place] [address]             [phone]       5 Days Post-Op Procedure(s) (LRB): CORONARY ARTERY BYPASS GRAFTING (CABG) TIMES ONE USING LIMA. (N/A) TRANSESOPHAGEAL ECHOCARDIOGRAM (TEE) (N/A)  Subjective:  Nausea improving, coughing some this morning.  Objective: Vital signs in last 24 hours: Temp:  [97.6 F (36.4 C)-98.5 F (36.9 C)] 98.5 F (36.9 C) (08/14 0325) Pulse Rate:  [74-99] 77 (08/14 0325) Cardiac Rhythm: Normal sinus rhythm (08/13 1900) Resp:  [16-20] 17 (08/14 0325) BP: (112-144)/(60-97) 131/66 (08/14 0325) SpO2:  [93 %-97 %] 97 % (08/14 0325) Weight:  [84.5 kg] 84.5 kg (08/14 0528)  General appearance: alert, cooperative, and no distress Heart: regular rate and rhythm Lungs: clear to auscultation bilaterally Abdomen: soft, non-tender; bowel sounds normal; no masses,  no organomegaly Extremities: edema trace Wound: clean and dry  Lab Results: Recent Labs    02/16/22 0208  WBC 12.6*  HGB 9.6*  HCT 28.1*  PLT 175   BMET:  Recent Labs    02/16/22 0208  NA 132*  K 4.3  CL 99  CO2 26  GLUCOSE 125*  BUN 14  CREATININE 0.76  CALCIUM 8.0*    PT/INR: No results for input(s): "LABPROT", "INR" in the last 72 hours. ABG    Component Value Date/Time   PHART 7.311 (L) 02/13/2022 1815   HCO3 22.2 02/13/2022 1815   TCO2 24 02/13/2022 1815   ACIDBASEDEF 4.0 (H) 02/13/2022 1815   O2SAT 95 02/13/2022 1815   CBG (last 3)  Recent Labs    02/17/22 1605 02/17/22 2114 02/18/22 0612  GLUCAP 107* 149* 126*    Assessment/Plan: S/P Procedure(s) (LRB): CORONARY ARTERY BYPASS GRAFTING (CABG) TIMES ONE USING LIMA. (N/A) TRANSESOPHAGEAL ECHOCARDIOGRAM (TEE) (N/A)  PAF, maintaining NSR- continue Amiodarone at reduced dose, lopressor Pulm- no acute issues, off oxygen,continue IS Renal- weight is below baseline, minimal edema on exam, will d/c with PRN lasix regimen GI- nausea improving, likely related to  Amiodarone, will d/c with anti-emetic CBGs- controlled, not a diabetic DIspo- patient stable, requested home PT over the weekend, however evaluation not ordered, will obtain and make arrangements per recommendations, will d/c home today once evaluation complete   LOS: 7 days    Ellwood Handler, PA-C 02/18/2022

## 2022-02-18 NOTE — Progress Notes (Signed)
Nsg Discharge Note  Admit Date:  02/11/2022 Discharge date: 02/18/2022   Teresa Marquez to be D/C'd Home per MD order.  AVS completed. Patient/caregiver able to verbalize understanding.  Discharge Medication: Allergies as of 02/18/2022       Reactions   Bactrim [sulfamethoxazole-trimethoprim]    Heart racing   Keflet [cephalexin]    Makes me bleed   Tetracyclines & Related    Yeast infection         Medication List     TAKE these medications    acetaminophen 500 MG tablet Commonly known as: TYLENOL Take 500 mg by mouth every 6 (six) hours as needed for moderate pain.   amiodarone 200 MG tablet Commonly known as: PACERONE Take 1 tablet (200 mg total) by mouth 2 (two) times daily. X 7 days, then decrease to 200 mg daily   aspirin 81 MG chewable tablet Chew 81 mg by mouth daily.   atorvastatin 80 MG tablet Commonly known as: LIPITOR Take 1 tablet (80 mg total) by mouth daily.   cyanocobalamin 1000 MCG tablet Commonly known as: VITAMIN B12 Take 1,000 mcg daily by mouth.   ezetimibe 10 MG tablet Commonly known as: ZETIA Take 10 mg by mouth daily.   fluticasone 50 MCG/ACT nasal spray Commonly known as: FLONASE Place 2 sprays into both nostrils every evening.   folic acid 259 MCG tablet Commonly known as: FOLVITE Take 400 mcg daily by mouth.   HAIR SKIN & NAILS ADVANCED PO Take 1 tablet by mouth daily.   metoprolol tartrate 25 MG tablet Commonly known as: LOPRESSOR Take 0.5 tablets (12.5 mg total) by mouth 2 (two) times daily.   omeprazole 20 MG capsule Commonly known as: PRILOSEC Take 10 mg by mouth every evening.   ondansetron 4 MG tablet Commonly known as: Zofran Take 1 tablet (4 mg total) by mouth every 8 (eight) hours as needed for nausea or vomiting.   REFRESH OP Place 1 drop into both eyes daily.   traMADol 50 MG tablet Commonly known as: ULTRAM Take 1 tablet (50 mg total) by mouth every 6 (six) hours as needed for moderate pain.    Turmeric 500 MG Caps Take 2 capsules by mouth daily.        Discharge Assessment: Vitals:   02/18/22 0325 02/18/22 0829  BP: 131/66 137/67  Pulse: 77 82  Resp: 17 18  Temp: 98.5 F (36.9 C) 97.8 F (36.6 C)  SpO2: 97% 96%   Skin clean, dry and intact without evidence of skin break down, no evidence of skin tears noted. IV catheter discontinued intact. Site without signs and symptoms of complications - no redness or edema noted at insertion site, patient denies c/o pain - only slight tenderness at site.  Dressing with slight pressure applied.  D/c Instructions-Education: Discharge instructions given to patient/family with verbalized understanding. D/c education completed with patient/family including follow up instructions, medication list, d/c activities limitations if indicated, with other d/c instructions as indicated by MD - patient able to verbalize understanding, all questions fully answered. Patient instructed to return to ED, call 911, or call MD for any changes in condition.  Patient escorted via [place], and D/C home via private auto.  Atilano Ina, RN 02/18/2022 11:15 AM

## 2022-02-18 NOTE — Progress Notes (Signed)
    CARDIOLOGY RECOMMENDATIONS:  Discharge is anticipated today Recommendations for medications and follow up:  Discharge Medications: Continue medications as they are currently listed in the [facility]. Exceptions to the above: Meds as on MAR  Follow Up: The patient's Primary Cardiologist is Freada Bergeron, MD  Follow up in the office is scheduled.  Signed,  Minus Breeding, MD  11:01 AM 02/18/2022  [facility]

## 2022-02-18 NOTE — Evaluation (Signed)
Physical Therapy Evaluation Patient Details Name: Teresa Marquez MRN: [id number] DOB: Jan 23, 1941 Today's Date: 02/18/2022  History of Present Illness  Patient is a 81 y/o female who presents on 8/7 for unstable angina s/p CABGx1 on 02/13/22. PMH includes DM, anxiety, drug induced myopathy.  Clinical Impression  Patient presents with hospital acquired deconditioning/weakness, dyspnea on exertion and impaired mobility s/p above surgery. Pt lives at home alone and reports being independent for ADLs/IADLs and driving PTA. Today, pt tolerated bed mobility to simulate home, transfers, ambulation and stair training with supervision-MOd I for safety. Problem solved using step stool to get into bed and how to safely negotiate stairs with assist from family. Pt reports having support from sons until Thursday of this week. Interested in [facility] aide. Pt plans to d/c home today with family. Education on sternal precautions and "move in the tube." Does not require further PT services. Discharge from therapy.        Recommendations for follow up therapy are one component of a multi-disciplinary discharge planning process, led by the attending physician.  Recommendations may be updated based on patient status, additional functional criteria and insurance authorization.  Follow Up Recommendations No PT follow up      Assistance Recommended at Discharge PRN  Patient can return home with the following  A little help with walking and/or transfers;A little help with bathing/dressing/bathroom;Assistance with cooking/housework;Assist for transportation;Help with stairs or ramp for entrance    Equipment Recommendations None recommended by PT  Recommendations for Other Services       Functional Status Assessment Patient has had a recent decline in their functional status and demonstrates the ability to make significant improvements in function in a reasonable and predictable amount of time.     Precautions /  Restrictions Restrictions Weight Bearing Restrictions: Yes Other Position/Activity Restrictions: sternal precautions      Mobility  Bed Mobility Overal bed mobility: Needs Assistance Bed Mobility: Rolling, Sidelying to Sit, Sit to Supine Rolling: Min guard Sidelying to sit: Min guard   Sit to supine: Supervision   General bed mobility comments: HOB flat and  no rails to simulate home setup, cues to roll onto side, increased time/effort.    Transfers Overall transfer level: Needs assistance Equipment used: Rolling walker (2 wheels) Transfers: Sit to/from Stand Sit to Stand: Modified independent (Device/Increase time)           General transfer comment: No assist needed, Stood from chair x1, from toilet x1, from EOB x1.    Ambulation/Gait Ambulation/Gait assistance: Supervision, Modified independent (Device/Increase time) Gait Distance (Feet): 300 Feet Assistive device: Rolling walker (2 wheels) Gait Pattern/deviations: Step-through pattern, Decreased stride length   Gait velocity interpretation: 1.31 - 2.62 ft/sec, indicative of limited community ambulator   General Gait Details: Slow, steady gait with 1 standing rest break post stairs.  Stairs Stairs: Yes Stairs assistance: Min assist Stair Management: Step to pattern Number of Stairs: 2 General stair comments: Use of rail on left and HHA on right simulating home, step to pattern. 2/4 DOE.  Wheelchair Mobility    Modified Rankin (Stroke Patients Only)       Balance Overall balance assessment: Mild deficits observed, not formally tested                                           Pertinent Vitals/Pain Pain Assessment Pain Assessment: No/denies pain  Home Living Family/patient expects to be discharged to:: Private residence Living Arrangements: Alone Available Help at Discharge: Family;Available 24 hours/day (until thursday, sons to help) Type of Home: House Home Access: Stairs to  enter   [employer] of Steps: 1 +1   Home Layout: One level Home Equipment: Conservation officer, nature (2 wheels)      Prior Function Prior Level of Function : Independent/Modified Independent             Mobility Comments: Independent, drives, goes to the [facility] ADLs Comments: independent     Hand Dominance   Dominant Hand: Right    Extremity/Trunk Assessment   Upper Extremity Assessment Upper Extremity Assessment: Defer to OT evaluation    Lower Extremity Assessment Lower Extremity Assessment: Overall WFL for tasks assessed       Communication   Communication: No difficulties  Cognition Arousal/Alertness: Awake/alert Behavior During Therapy: WFL for tasks assessed/performed Overall Cognitive Status: Within Functional Limits for tasks assessed                                          General Comments General comments (skin integrity, edema, etc.): VSS on RA.    Exercises     Assessment/Plan    PT Assessment Patient does not need any further PT services  PT Problem List         PT Treatment Interventions      PT Goals (Current goals can be found in the Care Plan section)  Acute Rehab PT Goals Patient Stated Goal: to go home PT Goal Formulation: All assessment and education complete, DC therapy    Frequency       Co-evaluation               AM-PAC PT "6 Clicks" Mobility  Outcome Measure Help needed turning from your back to your side while in a flat bed without using bedrails?: A Little Help needed moving from lying on your back to sitting on the side of a flat bed without using bedrails?: A Little Help needed moving to and from a bed to a chair (including a wheelchair)?: None Help needed standing up from a chair using your arms (e.g., wheelchair or bedside chair)?: None Help needed to walk in hospital room?: A Little Help needed climbing 3-5 steps with a railing? : A Little 6 Click Score: 20    End of Session  Equipment Utilized During Treatment: Gait belt Activity Tolerance: Patient tolerated treatment well Patient left: in bed;with call bell/phone within reach Nurse Communication: Mobility status PT Visit Diagnosis: Difficulty in walking, not elsewhere classified (R26.2);Muscle weakness (generalized) (M62.81)    Time: 7824-2353 PT Time Calculation (min) (ACUTE ONLY): 30 min   Charges:   PT Evaluation $PT Eval Moderate Complexity: 1 Mod PT Treatments $Gait Training: 8-22 mins        Marisa Severin, PT, DPT Acute Rehabilitation Services Secure chat preferred Office [place] 02/18/2022, 10:45 AM

## 2022-02-20 DIAGNOSIS — J310 Chronic rhinitis: Secondary | ICD-10-CM | POA: Diagnosis not present

## 2022-02-20 DIAGNOSIS — I10 Essential (primary) hypertension: Secondary | ICD-10-CM | POA: Diagnosis not present

## 2022-02-20 DIAGNOSIS — K760 Fatty (change of) liver, not elsewhere classified: Secondary | ICD-10-CM | POA: Diagnosis not present

## 2022-02-20 DIAGNOSIS — Z48812 Encounter for surgical aftercare following surgery on the circulatory system: Secondary | ICD-10-CM | POA: Diagnosis not present

## 2022-02-20 DIAGNOSIS — K219 Gastro-esophageal reflux disease without esophagitis: Secondary | ICD-10-CM | POA: Diagnosis not present

## 2022-02-20 DIAGNOSIS — M5412 Radiculopathy, cervical region: Secondary | ICD-10-CM | POA: Diagnosis not present

## 2022-02-20 DIAGNOSIS — F419 Anxiety disorder, unspecified: Secondary | ICD-10-CM | POA: Diagnosis not present

## 2022-02-20 DIAGNOSIS — Z9181 History of falling: Secondary | ICD-10-CM | POA: Diagnosis not present

## 2022-02-20 DIAGNOSIS — G4733 Obstructive sleep apnea (adult) (pediatric): Secondary | ICD-10-CM | POA: Diagnosis not present

## 2022-02-20 DIAGNOSIS — E782 Mixed hyperlipidemia: Secondary | ICD-10-CM | POA: Diagnosis not present

## 2022-02-20 DIAGNOSIS — G720 Drug-induced myopathy: Secondary | ICD-10-CM | POA: Diagnosis not present

## 2022-02-20 DIAGNOSIS — E119 Type 2 diabetes mellitus without complications: Secondary | ICD-10-CM | POA: Diagnosis not present

## 2022-02-20 DIAGNOSIS — M5136 Other intervertebral disc degeneration, lumbar region: Secondary | ICD-10-CM | POA: Diagnosis not present

## 2022-02-20 DIAGNOSIS — Z6831 Body mass index (BMI) 31.0-31.9, adult: Secondary | ICD-10-CM | POA: Diagnosis not present

## 2022-02-20 DIAGNOSIS — Z7982 Long term (current) use of aspirin: Secondary | ICD-10-CM | POA: Diagnosis not present

## 2022-02-20 DIAGNOSIS — Z951 Presence of aortocoronary bypass graft: Secondary | ICD-10-CM | POA: Diagnosis not present

## 2022-02-20 DIAGNOSIS — E669 Obesity, unspecified: Secondary | ICD-10-CM | POA: Diagnosis not present

## 2022-02-20 DIAGNOSIS — I25110 Atherosclerotic heart disease of native coronary artery with unstable angina pectoris: Secondary | ICD-10-CM | POA: Diagnosis not present

## 2022-02-20 DIAGNOSIS — Z79891 Long term (current) use of opiate analgesic: Secondary | ICD-10-CM | POA: Diagnosis not present

## 2022-02-26 ENCOUNTER — Telehealth: Payer: Self-pay

## 2022-02-26 NOTE — Telephone Encounter (Signed)
Patient contacted the office requesting appointment to have sutures removed. She is s/p CABG with Dr. Roxan Hockey 02/13/22. States that she has to get a ride and can only do tomorrow around 2 pm. Advised appointment made for suture removal.   Patient states that she is having trouble with a cough. States that she does occasionally get phlegm up when she coughs. She states that she has been taking Mucinex for the past 4-5 days.  When discharged from the hospital she did have small to moderate bilateral pleural effusions. Advised that if the coughing did not improve we could certainly get a chest xray to assess her lungs. She states that she does not want to do this at this time. Advised to let us know. She acknowledged receipt.

## 2022-02-27 ENCOUNTER — Ambulatory Visit
Admission: RE | Admit: 2022-02-27 | Discharge: 2022-02-27 | Disposition: A | Discharge status: Home / Self Care | Payer: Medicare Other | Source: Ambulatory Visit | Attending: Thoracic Surgery (Cardiothoracic Vascular Surgery) | Admitting: Thoracic Surgery (Cardiothoracic Vascular Surgery)

## 2022-02-27 ENCOUNTER — Other Ambulatory Visit: Payer: Self-pay | Admitting: *Deleted

## 2022-02-27 ENCOUNTER — Ambulatory Visit (INDEPENDENT_AMBULATORY_CARE_PROVIDER_SITE_OTHER): Payer: Self-pay | Admitting: *Deleted

## 2022-02-27 DIAGNOSIS — Z951 Presence of aortocoronary bypass graft: Secondary | ICD-10-CM

## 2022-02-27 DIAGNOSIS — R059 Cough, unspecified: Secondary | ICD-10-CM | POA: Diagnosis not present

## 2022-02-27 DIAGNOSIS — Z4802 Encounter for removal of sutures: Secondary | ICD-10-CM

## 2022-02-27 NOTE — Progress Notes (Signed)
Patient arrived for nurse visit to remove sutures post-CABG 8/9.  Two sutures removed with no signs or symptoms of infection noted.  Incisions well approximated.  Patient tolerated suture removal well.  Patient instructed to keep the incision site clean and dry. Patient with ongoing, productive cough and requested chest xray this morning. Macarthur Critchley, PA, examined patient and reviewed cxr. Advised patient chest xray shows decreasing pleural effusion. Advised patient to continue taking Mucinex as needed for productive cough. Advised patient to contact PCP if cough continues into next week. Patient verbalized understanding.

## 2022-02-28 DIAGNOSIS — Z48812 Encounter for surgical aftercare following surgery on the circulatory system: Secondary | ICD-10-CM | POA: Diagnosis not present

## 2022-02-28 DIAGNOSIS — I25110 Atherosclerotic heart disease of native coronary artery with unstable angina pectoris: Secondary | ICD-10-CM | POA: Diagnosis not present

## 2022-02-28 DIAGNOSIS — E782 Mixed hyperlipidemia: Secondary | ICD-10-CM | POA: Diagnosis not present

## 2022-02-28 DIAGNOSIS — K219 Gastro-esophageal reflux disease without esophagitis: Secondary | ICD-10-CM | POA: Diagnosis not present

## 2022-02-28 DIAGNOSIS — I10 Essential (primary) hypertension: Secondary | ICD-10-CM | POA: Diagnosis not present

## 2022-02-28 DIAGNOSIS — E119 Type 2 diabetes mellitus without complications: Secondary | ICD-10-CM | POA: Diagnosis not present

## 2022-03-06 DIAGNOSIS — E782 Mixed hyperlipidemia: Secondary | ICD-10-CM | POA: Diagnosis not present

## 2022-03-06 DIAGNOSIS — I10 Essential (primary) hypertension: Secondary | ICD-10-CM | POA: Diagnosis not present

## 2022-03-06 DIAGNOSIS — Z48812 Encounter for surgical aftercare following surgery on the circulatory system: Secondary | ICD-10-CM | POA: Diagnosis not present

## 2022-03-06 DIAGNOSIS — K219 Gastro-esophageal reflux disease without esophagitis: Secondary | ICD-10-CM | POA: Diagnosis not present

## 2022-03-06 DIAGNOSIS — I25110 Atherosclerotic heart disease of native coronary artery with unstable angina pectoris: Secondary | ICD-10-CM | POA: Diagnosis not present

## 2022-03-06 DIAGNOSIS — E119 Type 2 diabetes mellitus without complications: Secondary | ICD-10-CM | POA: Diagnosis not present

## 2022-03-07 ENCOUNTER — Encounter: Payer: Self-pay | Admitting: Physician Assistant

## 2022-03-07 ENCOUNTER — Ambulatory Visit: Payer: Medicare Other | Admitting: Physician Assistant

## 2022-03-07 ENCOUNTER — Ambulatory Visit: Payer: Medicare Other | Attending: Physician Assistant | Admitting: Physician Assistant

## 2022-03-07 VITALS — BP 132/58 | HR 68 | Ht 64.0 in | Wt 180.0 lb

## 2022-03-07 DIAGNOSIS — I2510 Atherosclerotic heart disease of native coronary artery without angina pectoris: Secondary | ICD-10-CM | POA: Insufficient documentation

## 2022-03-07 DIAGNOSIS — E785 Hyperlipidemia, unspecified: Secondary | ICD-10-CM | POA: Diagnosis not present

## 2022-03-07 DIAGNOSIS — Z951 Presence of aortocoronary bypass graft: Secondary | ICD-10-CM | POA: Insufficient documentation

## 2022-03-07 NOTE — Progress Notes (Signed)
Office Visit    Patient Name: Teresa Marquez Date of Encounter: 03/07/2022  PCP:  Merrilee Seashore, [place]  Cardiologist:  Freada Bergeron, MD  Advanced Practice Provider:  No care team member to display Electrophysiologist:  None   HPI    Teresa Marquez is a 81 y.o. female with past medical history significant for anxiety, diabetes mellitus, drug-induced myopathy, GERD, hyperlipidemia, OSA, and CAD status post CABG x1 presents today for postop follow-up.  Patient was referred to Dr. Johney Marquez was initially seen 01/11/2022 for complaints of statin intolerance and chest discomfort.  Patient reported that she was able to exercise with some increased shortness of breath and chest discomfort when doing high resistance activities.  Discomfort does improve with belching but does not completely resolve and is not associated with dizziness, syncope, orthopnea.  Coronary CTA was ordered and completed that revealed total occlusion of proximal mid LAD with recommendation of cardiac catheterization for further evaluation.  She was seen in the clinic by Teresa Pancoast, NP 02/11/2022.  She was still having chest pain and was set up for cardiac catheterization.  She underwent CABG x1 and has been healing well.  She still has a cough and had a chest x-ray done last Wednesday.  Z-Pak was ordered and that has been helping.  She was taking Mucinex but stopped when she started her Z-Pak.  She has heard from rehab but is going to wait until she sees T CTS.  She is no longer taking any pain medicine.  She sees her primary next month and plans to get a lipid panel at this time.  She has some statin intolerance in the past.  No significant chest pains other than incisional soreness and no shortness of breath.  Reports no shortness of breath nor dyspnea on exertion. Reports no chest pain, pressure, or tightness. No edema, orthopnea, PND. Reports no palpitations.   Past  Medical History    Past Medical History:  Diagnosis Date   Abdominal pain    Anxiety    Basal cell carcinoma    DDD (degenerative disc disease), lumbar    Diabetes mellitus without complication (HCC)    DM (diabetes mellitus) ([place])    Drug-induced myopathy    Elevated blood pressure reading in office without diagnosis of hypertension    GERD (gastroesophageal reflux disease)    Hyperlipidemia    Menopause    NAFLD (nonalcoholic fatty liver disease)    OSA (obstructive sleep apnea)    Past Surgical History:  Procedure Laterality Date   BREAST EXCISIONAL BIOPSY Left    benign   CARPAL TUNNEL RELEASE     CORONARY ARTERY BYPASS GRAFT N/A 02/13/2022   Procedure: CORONARY ARTERY BYPASS GRAFTING (CABG) TIMES ONE USING LIMA.;  Surgeon: Teresa Nakayama, MD;  Location: [place];  Service: Open Heart Surgery;  Laterality: N/A;   FOOT SURGERY     LEFT HEART CATH AND CORONARY ANGIOGRAPHY N/A 02/11/2022   Procedure: LEFT HEART CATH AND CORONARY ANGIOGRAPHY;  Surgeon: Teresa Mocha, MD;  Location: [place] [facility];  Service: Cardiovascular;  Laterality: N/A;   NASAL SINUS SURGERY     pituitary adenoma resection     SHOULDER SURGERY     TEE WITHOUT CARDIOVERSION N/A 02/13/2022   Procedure: TRANSESOPHAGEAL ECHOCARDIOGRAM (TEE);  Surgeon: Teresa Nakayama, MD;  Location: [place] [facility];  Service: Open Heart Surgery;  Laterality: N/A;   TOTAL HIP ARTHROPLASTY     TUBAL  LIGATION      Allergies  Allergies  Allergen Reactions   Bactrim [Sulfamethoxazole-Trimethoprim]     Heart racing   Wampum [Cephalexin]     Makes me bleed   Statins     Other reaction(s): worsening neuropathy   Tetracyclines & Related     Yeast infection      EKGs/Labs/Other Studies Reviewed:   The following studies were reviewed today: Cardiac catheterization 02/11/2022  1.  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  Patent left main with mild distal left main stenosis 3.  Patent left circumflex  with mild diffuse nonobstructive disease 4.  Patent RCA (large, dominant vessel) with mild diffuse plaquing but no areas of high-grade stenosis 5.  Mild hypokinesis of the distal anterior wall with preserved overall LVEF estimated at 55 to 65%   Recommend: Resume heparin 2 hours after TR band off, TCTS consultation for consideration of CABG  Left Main  Dist LM lesion is 30% stenosed. There is mild tapering of the distal left main stem with 30% stenosis present    Left Anterior Descending  The LAD has subtotal proximal/ostial stenosis. The vessel tapers at the junction of the proximal to mid LAD as it is somewhat ectatic through the proximal segment. The mid and distal LAD have no significant stenosis. The LAD terminates at the apex. The first diagonal branch is patent with no significant stenosis.  Ost LAD to Prox LAD lesion is 99% stenosed. The ostium of the LAD demonstrates subtotal stenosis with TIMI-3 flow but high-grade 99% stenosis is present back to the true ostium of the LAD.    Left Circumflex  The vessel exhibits minimal luminal irregularities. The circumflex is patent with mild irregularity in the proximal vessel, mild irregularity in the mid vessel, and no significant stenosis.    Right Coronary Artery  Vessel is large. There is mild diffuse disease throughout the vessel. The RCA is a large, dominant vessel. There is diffuse irregularity throughout the proximal, mid, and distal vessel with mild ectasia and no areas of high-grade stenosis. The PDA and PLA branches are patent with no significant stenoses.  Prox RCA lesion is 30% stenosed.  Dist RCA lesion is 40% stenosed.    Right Posterior Descending Artery  RPDA lesion is 30% stenosed.    Intervention   No interventions have been documented.   Wall Motion              Left Heart  Left Ventricle The left ventricular size is normal. The left ventricular systolic function is normal. LV end diastolic pressure is normal.  The left ventricular ejection fraction is 55-65% by visual estimate. There are LV function abnormalities due to segmental dysfunction.   Coronary Diagrams  Diagnostic Dominance: Right  Intervention   EKG:  EKG is not ordered today.    Recent Labs: 02/14/2022: Magnesium 2.4 02/16/2022: BUN 14; Creatinine, Ser 0.76; Hemoglobin 9.6; Platelets 175; Potassium 4.3; Sodium 132  Recent Lipid Panel    Component Value Date/Time   CHOL 174 02/12/2022 0808   TRIG 160 (H) 02/12/2022 0808   HDL 36 (L) 02/12/2022 0808   CHOLHDL 4.8 02/12/2022 0808   VLDL 32 02/12/2022 0808   LDLCALC 106 (H) 02/12/2022 0808    Home Medications   Current Meds  Medication Sig   acetaminophen (TYLENOL) 500 MG tablet Take 500 mg by mouth every 6 (six) hours as needed for moderate pain.   amiodarone (PACERONE) 200 MG tablet Take 1 tablet (200  mg total) by mouth 2 (two) times daily. X 7 days, then decrease to 200 mg daily   aspirin 81 MG chewable tablet Chew 81 mg by mouth daily.   atorvastatin (LIPITOR) 80 MG tablet Take 1 tablet (80 mg total) by mouth daily.   azithromycin (ZITHROMAX) 250 MG tablet Take by mouth.   ezetimibe (ZETIA) 10 MG tablet Take 10 mg by mouth daily.   fluticasone (FLONASE) 50 MCG/ACT nasal spray Place 2 sprays into both nostrils every evening.   folic acid (FOLVITE) 595 MCG tablet Take 400 mcg daily by mouth.   metoprolol tartrate (LOPRESSOR) 25 MG tablet Take 0.5 tablets (12.5 mg total) by mouth 2 (two) times daily.   MISC NATURAL PRODUCTS PO Take 1 Application by mouth daily. Milk Thistle   Multiple Vitamins-Minerals (HAIR SKIN & NAILS ADVANCED PO) Take 1 tablet by mouth daily.   omeprazole (PRILOSEC) 20 MG capsule Take 10 mg by mouth every evening.   ondansetron (ZOFRAN) 4 MG tablet Take 1 tablet (4 mg total) by mouth every 8 (eight) hours as needed for nausea or vomiting.   Polyvinyl Alcohol-Povidone (REFRESH OP) Place 1 drop into both eyes daily.   Pseudoeph-Bromphen-DM (BROMFED DM  PO) 5 ml as needed   traMADol (ULTRAM) 50 MG tablet Take 1 tablet (50 mg total) by mouth every 6 (six) hours as needed for moderate pain.   Turmeric 500 MG CAPS Take 2 capsules by mouth daily.   vitamin B-12 (CYANOCOBALAMIN) 1000 MCG tablet Take 1,000 mcg daily by mouth.     Review of Systems      All other systems reviewed and are otherwise negative except as noted above.  Physical Exam    VS:  BP (!) 132/58   Pulse 68   Ht _0  (1.626 m)   Wt 180 lb (81.6 kg)   SpO2 98%   BMI 30.90 kg/m  , BMI Body mass index is 30.9 kg/m.  Wt Readings from Last 3 Encounters:  03/07/22 180 lb (81.6 kg)  02/18/22 186 lb 4.8 oz (84.5 kg)  02/11/22 188 lb 12.8 oz (85.6 kg)     GEN: Well nourished, well developed, in no acute distress. HEENT: normal. Neck: Supple, no JVD, carotid bruits, or masses. Cardiac: RRR, no murmurs, rubs, or gallops. No clubbing, cyanosis, edema.  Radials/PT 2+ and equal bilaterally.  Respiratory:  Respirations regular and unlabored, clear to auscultation bilaterally. GI: Soft, nontender, nondistended. MS: No deformity or atrophy. Skin: Warm and dry, no rash. Neuro:  Strength and sensation are intact. Psych: Normal affect.  Assessment & Plan    CAD status post CABG x1 on 8/9 -Continue current medication regimen which includes amiodarone 200 mg daily, aspirin 81 mg daily, Lipitor 80 mg daily, Zetia 10 mg daily, Lopressor 12.5 mg twice daily -no chest pains -follow-up with TCTS 9/12 -to discuss cardiac clearance at that appointment  Hyperlipidemia -Lipid panel next month with primary -She has already met with our Pharm.D. about starting some the injectable options and seems open to it. -She has had issues with statins in the past but currently does not endorse any symptoms -Continue Zetia 10 mg daily and Lipitor 80 mg daily         Disposition: Follow up 3 months with Freada Bergeron, MD or APP.  Signed, Elgie Collard, PA-C 03/07/2022, 12:56 PM [facility]

## 2022-03-07 NOTE — Patient Instructions (Signed)
Medication Instructions:  Your physician recommends that you continue on your current medications as directed. Please refer to the Current Medication list given to you today.  *If you need a refill on your cardiac medications before your next appointment, please call your pharmacy*   Lab Work: None If you have labs (blood work) drawn today and your tests are completely normal, you will receive your results only by: [place] (if you have MyChart) OR A paper copy in the mail If you have any lab test that is abnormal or we need to change your treatment, we will call you to review the results.   Follow-Up: At [facility], you and your health needs are our priority.  As part of our continuing mission to provide you with exceptional heart care, we have created designated Provider Care Teams.  These Care Teams include your primary Cardiologist (physician) and Advanced Practice Providers (APPs -  Physician Assistants and Nurse Practitioners) who all work together to provide you with the care you need, when you need it.  Your next appointment:   3 month(s)  The format for your next appointment:   In Person  Provider:   Freada Bergeron, MD   Important Information About Sugar

## 2022-03-12 ENCOUNTER — Encounter: Payer: Medicare Other | Attending: Internal Medicine | Admitting: *Deleted

## 2022-03-12 DIAGNOSIS — Z48812 Encounter for surgical aftercare following surgery on the circulatory system: Secondary | ICD-10-CM | POA: Insufficient documentation

## 2022-03-12 DIAGNOSIS — Z951 Presence of aortocoronary bypass graft: Secondary | ICD-10-CM | POA: Insufficient documentation

## 2022-03-12 NOTE — Progress Notes (Signed)
Initial telephone completed. Diagnosis can be found in [facility] 8/7. EP orientation scheduled for Monday 9/18 at 1:30

## 2022-03-13 DIAGNOSIS — K219 Gastro-esophageal reflux disease without esophagitis: Secondary | ICD-10-CM | POA: Diagnosis not present

## 2022-03-13 DIAGNOSIS — E782 Mixed hyperlipidemia: Secondary | ICD-10-CM | POA: Diagnosis not present

## 2022-03-13 DIAGNOSIS — I25110 Atherosclerotic heart disease of native coronary artery with unstable angina pectoris: Secondary | ICD-10-CM | POA: Diagnosis not present

## 2022-03-13 DIAGNOSIS — I10 Essential (primary) hypertension: Secondary | ICD-10-CM | POA: Diagnosis not present

## 2022-03-13 DIAGNOSIS — Z48812 Encounter for surgical aftercare following surgery on the circulatory system: Secondary | ICD-10-CM | POA: Diagnosis not present

## 2022-03-13 DIAGNOSIS — E119 Type 2 diabetes mellitus without complications: Secondary | ICD-10-CM | POA: Diagnosis not present

## 2022-03-18 ENCOUNTER — Other Ambulatory Visit: Payer: Self-pay | Admitting: Thoracic Surgery (Cardiothoracic Vascular Surgery)

## 2022-03-18 DIAGNOSIS — Z951 Presence of aortocoronary bypass graft: Secondary | ICD-10-CM

## 2022-03-19 ENCOUNTER — Encounter: Payer: Self-pay | Admitting: Thoracic Surgery (Cardiothoracic Vascular Surgery)

## 2022-03-19 ENCOUNTER — Ambulatory Visit (INDEPENDENT_AMBULATORY_CARE_PROVIDER_SITE_OTHER): Payer: Self-pay | Admitting: Thoracic Surgery (Cardiothoracic Vascular Surgery)

## 2022-03-19 ENCOUNTER — Ambulatory Visit
Admission: RE | Admit: 2022-03-19 | Discharge: 2022-03-19 | Disposition: A | Discharge status: Home / Self Care | Payer: Medicare Other | Source: Ambulatory Visit | Attending: Thoracic Surgery (Cardiothoracic Vascular Surgery) | Admitting: Thoracic Surgery (Cardiothoracic Vascular Surgery)

## 2022-03-19 VITALS — BP 155/78 | HR 69 | Resp 18 | Ht 64.0 in | Wt 180.0 lb

## 2022-03-19 DIAGNOSIS — I25110 Atherosclerotic heart disease of native coronary artery with unstable angina pectoris: Secondary | ICD-10-CM | POA: Diagnosis not present

## 2022-03-19 DIAGNOSIS — E119 Type 2 diabetes mellitus without complications: Secondary | ICD-10-CM | POA: Diagnosis not present

## 2022-03-19 DIAGNOSIS — E782 Mixed hyperlipidemia: Secondary | ICD-10-CM | POA: Diagnosis not present

## 2022-03-19 DIAGNOSIS — R059 Cough, unspecified: Secondary | ICD-10-CM | POA: Diagnosis not present

## 2022-03-19 DIAGNOSIS — Z951 Presence of aortocoronary bypass graft: Secondary | ICD-10-CM

## 2022-03-19 DIAGNOSIS — K219 Gastro-esophageal reflux disease without esophagitis: Secondary | ICD-10-CM | POA: Diagnosis not present

## 2022-03-19 DIAGNOSIS — Z48812 Encounter for surgical aftercare following surgery on the circulatory system: Secondary | ICD-10-CM | POA: Diagnosis not present

## 2022-03-19 DIAGNOSIS — I10 Essential (primary) hypertension: Secondary | ICD-10-CM | POA: Diagnosis not present

## 2022-03-19 NOTE — Progress Notes (Signed)
[address]       [place], [address]             [phone]    HPI: Teresa Marquez returns for a scheduled follow-up visit  Teresa Marquez is an 81 year old woman with history of hypertension, hyperlipidemia, type 2 diabetes, reflux, fatty liver disease, obesity, and obstructive sleep apnea.  She presented with worsening angina.  She was found to have a tight ostial LAD stenosis.  She underwent coronary bypass grafting x1 on 02/13/2022.  Postoperatively she had atrial fibrillation which converted to sinus rhythm with amiodarone.  She went home on postoperative day #5.  She was doing well from a pain standpoint until last week when she got a cold.  She had a lot of coughing and that caused a considerable amount of discomfort.  She also complains of some itching around the incisions.  No recurrent anginal symptoms.  She is concerned about the high dose Lipitor.  Past Medical History:  Diagnosis Date   Abdominal pain    Anxiety    Basal cell carcinoma    DDD (degenerative disc disease), lumbar    Diabetes mellitus without complication (HCC)    DM (diabetes mellitus) ([place])    Drug-induced myopathy    Elevated blood pressure reading in office without diagnosis of hypertension    GERD (gastroesophageal reflux disease)    Hyperlipidemia    Menopause    NAFLD (nonalcoholic fatty liver disease)    OSA (obstructive sleep apnea)     Current Outpatient Medications  Medication Sig Dispense Refill   acetaminophen (TYLENOL) 500 MG tablet Take 500 mg by mouth every 6 (six) hours as needed for moderate pain.     amiodarone (PACERONE) 200 MG tablet Take 1 tablet (200 mg total) by mouth 2 (two) times daily. X 7 days, then decrease to 200 mg daily 60 tablet 1   aspirin 81 MG chewable tablet Chew 81 mg by mouth daily.     atorvastatin (LIPITOR) 80 MG tablet Take 1 tablet (80 mg total) by mouth daily. 30 tablet 3   ezetimibe (ZETIA) 10 MG tablet Take 10 mg by mouth daily.      fluticasone (FLONASE) 50 MCG/ACT nasal spray Place 2 sprays into both nostrils every evening.     folic acid (FOLVITE) 619 MCG tablet Take 400 mcg daily by mouth.     metoprolol tartrate (LOPRESSOR) 25 MG tablet Take 0.5 tablets (12.5 mg total) by mouth 2 (two) times daily. 60 tablet 3   MISC NATURAL PRODUCTS PO Take 1 Application by mouth daily. Milk Thistle     Multiple Vitamins-Minerals (HAIR SKIN & NAILS ADVANCED PO) Take 1 tablet by mouth daily.     omeprazole (PRILOSEC) 20 MG capsule Take 10 mg by mouth every evening.     Polyvinyl Alcohol-Povidone (REFRESH OP) Place 1 drop into both eyes daily.     Pseudoeph-Bromphen-DM (BROMFED DM PO) 5 ml as needed     Turmeric 500 MG CAPS Take 2 capsules by mouth daily.     vitamin B-12 (CYANOCOBALAMIN) 1000 MCG tablet Take 1,000 mcg daily by mouth.     No current facility-administered medications for this visit.    Physical Exam BP (!) 155/78   Pulse 69   Resp 18   Ht '5\' 4"'$  (1.626 m)   Wt 180 lb (81.6 kg)   SpO2 96% Comment: RA  BMI 30.83 kg/m  81 year old woman in no acute distress Alert and oriented  x3 with no focal deficits Lungs clear bilaterally Cardiac regular rate and rhythm Sternum stable, incision clean dry and intact Extremities no edema  Diagnostic Tests: CHEST - 2 VIEW   COMPARISON:  Chest x-ray 02/27/2022   FINDINGS: Small pleural effusions are unchanged. There is no focal lung infiltrate or pneumothorax. Cardiomediastinal silhouette is within normal limits. Sternotomy wires are present. No acute fractures.   IMPRESSION: Unchanged small bilateral pleural effusions.     Electronically Signed   By: Ronney Asters M.D.   On: 03/19/2022 15:17 I personally reviewed the chest x-ray images.  Postoperative changes with minimal effusions bilaterally.  Impression: Teresa Marquez is an 81 year old woman with history of hypertension, hyperlipidemia, type 2 diabetes, reflux, fatty liver disease, obesity, and  obstructive sleep apnea.  She presented with worsening angina.  She was found to have a tight ostial LAD stenosis.  She underwent coronary bypass grafting x1 with a mammary to LAD on 02/13/2022.  Postoperatively she had atrial fibrillation but converted to sinus rhythm with amiodarone.  She is currently doing well.  She has not had any recurrent angina.  Her exercise tolerance is good.  She is anxious to increase her activities.  I advised her not to lift anything over 10 pounds for another week and nothing over 20 pounds for another 3 weeks.  She wants to pull some weeds in her yard.  I recommended she wait least a week and then do only small sections at the time.  She is already driving.  She is concerned about the Lipitor dose.  She has not had any problems with that yet but has had some issues with statins in the past.  She and Dr. Johney Frame have discussed other options but she has not arrived a decision yet.  She is fine to start cardiac rehab.  Postoperative atrial fibrillation-on amiodarone 200 mg daily.  Can stop after current prescription runs out.  Plan: Follow-up with Dr. Johney Frame I will be happy to see Teresa Marquez back anytime in the future if I can be of any further assistance with her.  Teresa Nakayama, MD [facility] [phone]

## 2022-03-25 ENCOUNTER — Other Ambulatory Visit: Payer: Self-pay | Admitting: Internal Medicine

## 2022-03-25 VITALS — Ht 63.0 in | Wt 181.0 lb

## 2022-03-25 DIAGNOSIS — Z1231 Encounter for screening mammogram for malignant neoplasm of breast: Secondary | ICD-10-CM

## 2022-03-25 DIAGNOSIS — Z951 Presence of aortocoronary bypass graft: Secondary | ICD-10-CM

## 2022-03-25 DIAGNOSIS — Z48812 Encounter for surgical aftercare following surgery on the circulatory system: Secondary | ICD-10-CM | POA: Diagnosis not present

## 2022-03-25 NOTE — Patient Instructions (Signed)
Patient Instructions  Patient Details  Name: Teresa Marquez MRN: [id number] Date of Birth: [birth year] Referring Provider:  Sherren Mocha, MD  Below are your personal goals for exercise, nutrition, and risk factors. Our goal is to help you stay on track towards obtaining and maintaining these goals. We will be discussing your progress on these goals with you throughout the program.  Initial Exercise Prescription:  Initial Exercise Prescription - 03/25/22 1500       Date of Initial Exercise RX and Referring Provider   Date 03/25/22    Referring Provider Burt Knack      Oxygen   Maintain Oxygen Saturation 88% or higher      Bike   Level 1    Watts 15    Minutes 15    METs 1.74      NuStep   Level 1    SPM 80    Minutes 15    METs 1.74      T5 Nustep   Level 1    SPM 80    Minutes 15    METs 1.74      Biostep-RELP   Level 1    SPM 50    Minutes 15    METs 1.74      Track   Laps 22    Minutes 15    METs 2.2      Prescription Details   Frequency (times per week) 3    Duration Progress to 30 minutes of continuous aerobic without signs/symptoms of physical distress      Intensity   THRR 40-80% of Max Heartrate 90-124    Ratings of Perceived Exertion 11-13    Perceived Dyspnea 0-4      Progression   Progression Continue to progress workloads to maintain intensity without signs/symptoms of physical distress.      Resistance Training   Training Prescription Yes    Weight 2 lb    Reps 10-15             Exercise Goals: Frequency: Be able to perform aerobic exercise two to three times per week in program working toward 2-5 days per week of home exercise.  Intensity: Work with a perceived exertion of 11 (fairly light) - 15 (hard) while following your exercise prescription.  We will make changes to your prescription with you as you progress through the program.   Duration: Be able to do 30 to 45 minutes of continuous aerobic exercise in addition to a 5  minute warm-up and a 5 minute cool-down routine.   Nutrition Goals: Your personal nutrition goals will be established when you do your nutrition analysis with the dietician.  The following are general nutrition guidelines to follow: Cholesterol < '200mg'$ /day Sodium < '1500mg'$ /day Fiber: Women over 50 yrs - 21 grams per day  Personal Goals:  Personal Goals and Risk Factors at Admission - 03/25/22 1523       Core Components/Risk Factors/Patient Goals on Admission    Weight Management Yes;Weight Loss    Intervention Weight Management: Develop a combined nutrition and exercise program designed to reach desired caloric intake, while maintaining appropriate intake of nutrient and fiber, sodium and fats, and appropriate energy expenditure required for the weight goal.;Weight Management: Provide education and appropriate resources to help participant work on and attain dietary goals.;Weight Management/Obesity: Establish reasonable short term and long term weight goals.    Admit Weight 181 lb (82.1 kg)    Goal Weight: Short Term 176 lb (79.8 kg)  Goal Weight: Long Term 170 lb (77.1 kg)    Expected Outcomes Short Term: Continue to assess and modify interventions until short term weight is achieved;Long Term: Adherence to nutrition and physical activity/exercise program aimed toward attainment of established weight goal;Weight Loss: Understanding of general recommendations for a balanced deficit meal plan, which promotes 1-2 lb weight loss per week and includes a negative energy balance of [phone] kcal/d;Understanding recommendations for meals to include 15-35% energy as protein, 25-35% energy from fat, 35-60% energy from carbohydrates, less than '200mg'$  of dietary cholesterol, 20-35 gm of total fiber daily;Understanding of distribution of calorie intake throughout the day with the consumption of 4-5 meals/snacks    Diabetes Yes   Diet Controlled   Hypertension Yes    Intervention Provide education on  lifestyle modifcations including regular physical activity/exercise, weight management, moderate sodium restriction and increased consumption of fresh fruit, vegetables, and low fat dairy, alcohol moderation, and smoking cessation.;Monitor prescription use compliance.    Expected Outcomes Short Term: Continued assessment and intervention until BP is < 140/88m HG in hypertensive participants. < 130/849mHG in hypertensive participants with diabetes, heart failure or chronic kidney disease.;Long Term: Maintenance of blood pressure at goal levels.    Lipids Yes    Intervention Provide education and support for participant on nutrition & aerobic/resistive exercise along with prescribed medications to achieve LDL '70mg'$ , HDL >'40mg'$ .    Expected Outcomes Short Term: Participant states understanding of desired cholesterol values and is compliant with medications prescribed. Participant is following exercise prescription and nutrition guidelines.;Long Term: Cholesterol controlled with medications as prescribed, with individualized exercise RX and with personalized nutrition plan. Value goals: LDL < '70mg'$ , HDL > 40 mg.             Tobacco Use Initial Evaluation: Social History   Tobacco Use  Smoking Status Never  Smokeless Tobacco Never    Exercise Goals and Review:  Exercise Goals     Row Name 03/25/22 1541             Exercise Goals   Increase Physical Activity Yes       Intervention Provide advice, education, support and counseling about physical activity/exercise needs.;Develop an individualized exercise prescription for aerobic and resistive training based on initial evaluation findings, risk stratification, comorbidities and participant's personal goals.       Expected Outcomes Short Term: Attend rehab on a regular basis to increase amount of physical activity.;Long Term: Add in home exercise to make exercise part of routine and to increase amount of physical activity.;Long Term:  Exercising regularly at least 3-5 days a week.       Increase Strength and Stamina Yes       Intervention Provide advice, education, support and counseling about physical activity/exercise needs.;Develop an individualized exercise prescription for aerobic and resistive training based on initial evaluation findings, risk stratification, comorbidities and participant's personal goals.       Expected Outcomes Short Term: Increase workloads from initial exercise prescription for resistance, speed, and METs.;Long Term: Improve cardiorespiratory fitness, muscular endurance and strength as measured by increased METs and functional capacity (6MWT);Short Term: Perform resistance training exercises routinely during rehab and add in resistance training at home       Able to understand and use rate of perceived exertion (RPE) scale Yes       Intervention Provide education and explanation on how to use RPE scale       Expected Outcomes Short Term: Able to use RPE daily in rehab to  express subjective intensity level;Long Term:  Able to use RPE to guide intensity level when exercising independently       Able to understand and use Dyspnea scale Yes       Intervention Provide education and explanation on how to use Dyspnea scale       Expected Outcomes Short Term: Able to use Dyspnea scale daily in rehab to express subjective sense of shortness of breath during exertion;Long Term: Able to use Dyspnea scale to guide intensity level when exercising independently       Knowledge and understanding of Target Heart Rate Range (THRR) Yes       Intervention Provide education and explanation of THRR including how the numbers were predicted and where they are located for reference       Expected Outcomes Short Term: Able to state/look up THRR;Short Term: Able to use daily as guideline for intensity in rehab;Long Term: Able to use THRR to govern intensity when exercising independently       Able to check pulse independently Yes        Intervention Provide education and demonstration on how to check pulse in carotid and radial arteries.;Review the importance of being able to check your own pulse for safety during independent exercise       Expected Outcomes Short Term: Able to explain why pulse checking is important during independent exercise;Long Term: Able to check pulse independently and accurately       Understanding of Exercise Prescription Yes       Intervention Provide education, explanation, and written materials on patient's individual exercise prescription       Expected Outcomes Long Term: Able to explain home exercise prescription to exercise independently;Short Term: Able to explain program exercise prescription                Copy of goals given to participant.

## 2022-03-25 NOTE — Progress Notes (Signed)
Cardiac Individual Treatment Plan  Patient Details  Name: Teresa Marquez MRN: [id number] Date of Birth: 1940/12/09 Referring Provider:   Flowsheet Row Cardiac Rehab from 03/25/2022 in [facility] Cardiac and Pulmonary Rehab  Referring Provider Burt Knack       Initial Encounter Date:  Flowsheet Row Cardiac Rehab from 03/25/2022 in [facility] Cardiac and Pulmonary Rehab  Date 03/25/22       Visit Diagnosis: S/P CABG x 1  Patient's Home Medications on Admission:  Current Outpatient Medications:    acetaminophen (TYLENOL) 500 MG tablet, Take 500 mg by mouth every 6 (six) hours as needed for moderate pain., Disp: , Rfl:    amiodarone (PACERONE) 200 MG tablet, Take 1 tablet (200 mg total) by mouth 2 (two) times daily. X 7 days, then decrease to 200 mg daily, Disp: 60 tablet, Rfl: 1   aspirin 81 MG chewable tablet, Chew 81 mg by mouth daily., Disp: , Rfl:    atorvastatin (LIPITOR) 80 MG tablet, Take 1 tablet (80 mg total) by mouth daily., Disp: 30 tablet, Rfl: 3   ezetimibe (ZETIA) 10 MG tablet, Take 10 mg by mouth daily., Disp: , Rfl:    fluticasone (FLONASE) 50 MCG/ACT nasal spray, Place 2 sprays into both nostrils every evening., Disp: , Rfl:    folic acid (FOLVITE) 841 MCG tablet, Take 400 mcg daily by mouth., Disp: , Rfl:    metoprolol tartrate (LOPRESSOR) 25 MG tablet, Take 0.5 tablets (12.5 mg total) by mouth 2 (two) times daily., Disp: 60 tablet, Rfl: 3   MISC NATURAL PRODUCTS PO, Take 1 Application by mouth daily. Milk Thistle, Disp: , Rfl:    Multiple Vitamins-Minerals (HAIR SKIN & NAILS ADVANCED PO), Take 1 tablet by mouth daily., Disp: , Rfl:    omeprazole (PRILOSEC) 20 MG capsule, Take 10 mg by mouth every evening., Disp: , Rfl:    Polyvinyl Alcohol-Povidone (REFRESH OP), Place 1 drop into both eyes daily., Disp: , Rfl:    Pseudoeph-Bromphen-DM (BROMFED DM PO), 5 ml as needed, Disp: , Rfl:    Turmeric 500 MG CAPS, Take 2 capsules by mouth daily., Disp: , Rfl:    vitamin B-12  (CYANOCOBALAMIN) 1000 MCG tablet, Take 1,000 mcg daily by mouth., Disp: , Rfl:   Past Medical History: Past Medical History:  Diagnosis Date   Abdominal pain    Anxiety    Basal cell carcinoma    DDD (degenerative disc disease), lumbar    Diabetes mellitus without complication ([place])    DM (diabetes mellitus) ([place])    Drug-induced myopathy    Elevated blood pressure reading in office without diagnosis of hypertension    GERD (gastroesophageal reflux disease)    Hyperlipidemia    Menopause    NAFLD (nonalcoholic fatty liver disease)    OSA (obstructive sleep apnea)     Tobacco Use: Social History   Tobacco Use  Smoking Status Never  Smokeless Tobacco Never    Labs: Review Flowsheet       Latest Ref Rng & Units 02/11/2022 02/12/2022 02/13/2022  Labs for ITP Cardiac and Pulmonary Rehab  Cholestrol 0 - 200 mg/dL - 174  -  LDL (calc) 0 - 99 mg/dL - 106  -  HDL-C >40 mg/dL - 36  -  Trlycerides <150 mg/dL - 160  -  Hemoglobin A1c 4.8 - 5.6 % - 7.0  -  PH, Arterial 7.35 - 7.45 - - 7.311  7.357  7.322  7.421  7.461  7.434  7.372   PCO2 arterial  32 - 48 mmHg - - 43.8  47.0  44.6  37.2  37.9  36.9  45.8   Bicarbonate 20.0 - 28.0 mmol/L - - 22.2  26.6  23.4  24.2  27.0  24.7  26.6   TCO2 22 - 32 mmol/L 24  - '24  28  25  21  25  25  28  26  23  23  28   '$ Acid-base deficit 0.0 - 2.0 mmol/L - - 4.0  3.0   O2 Saturation % - - 95  99  95  95  100  100  100      Exercise Target Goals: Exercise Program Goal: Individual exercise prescription set using results from initial 6 min walk test and THRR while considering  patient's activity barriers and safety.   Exercise Prescription Goal: Initial exercise prescription builds to 30-45 minutes a day of aerobic activity, 2-3 days per week.  Home exercise guidelines will be given to patient during program as part of exercise prescription that the participant will acknowledge.   Education: Aerobic Exercise: - Group verbal and visual presentation  on the components of exercise prescription. Introduces F.I.T.T principle from ACSM for exercise prescriptions.  Reviews F.I.T.T. principles of aerobic exercise including progression. Written material given at graduation.   Education: Resistance Exercise: - Group verbal and visual presentation on the components of exercise prescription. Introduces F.I.T.T principle from ACSM for exercise prescriptions  Reviews F.I.T.T. principles of resistance exercise including progression. Written material given at graduation.    Education: Exercise & Equipment Safety: - Individual verbal instruction and demonstration of equipment use and safety with use of the equipment. Flowsheet Row Cardiac Rehab from 03/25/2022 in [facility] Cardiac and Pulmonary Rehab  Date 03/25/22  Educator NT  Instruction Review Code 1- Verbalizes Understanding       Education: Exercise Physiology & General Exercise Guidelines: - Group verbal and written instruction with models to review the exercise physiology of the cardiovascular system and associated critical values. Provides general exercise guidelines with specific guidelines to those with heart or lung disease.    Education: Flexibility, Balance, Mind/Body Relaxation: - Group verbal and visual presentation with interactive activity on the components of exercise prescription. Introduces F.I.T.T principle from ACSM for exercise prescriptions. Reviews F.I.T.T. principles of flexibility and balance exercise training including progression. Also discusses the mind body connection.  Reviews various relaxation techniques to help reduce and manage stress (i.e. Deep breathing, progressive muscle relaxation, and visualization). Balance handout provided to take home. Written material given at graduation.   Activity Barriers & Risk Stratification:  Activity Barriers & Cardiac Risk Stratification - 03/25/22 1526       Activity Barriers & Cardiac Risk Stratification   Activity Barriers  None;Muscular Weakness;Deconditioning;Other (comment)    Comments Hip Fatigue/Pain    Cardiac Risk Stratification Moderate             6 Minute Walk:  6 Minute Walk     Row Name 03/25/22 1525         6 Minute Walk   Phase Initial     Distance 1130 feet     Walk Time 6 minutes     # of Rest Breaks 0     MPH 2.12     METS 1.74     RPE 11     Perceived Dyspnea  1     VO2 Peak 6.09     Symptoms Yes (comment)     Comments Hip tightness  Resting HR 58 bpm     Resting BP 122/62     Resting Oxygen Saturation  97 %     Exercise Oxygen Saturation  during 6 min walk 96 %     Max Ex. HR 91 bpm     Max Ex. BP 146/70     2 Minute Post BP 132/64              Oxygen Initial Assessment:   Oxygen Re-Evaluation:   Oxygen Discharge (Final Oxygen Re-Evaluation):   Initial Exercise Prescription:  Initial Exercise Prescription - 03/25/22 1500       Date of Initial Exercise RX and Referring Provider   Date 03/25/22    Referring Provider Burt Knack      Oxygen   Maintain Oxygen Saturation 88% or higher      Bike   Level 1    Watts 15    Minutes 15    METs 1.74      NuStep   Level 1    SPM 80    Minutes 15    METs 1.74      T5 Nustep   Level 1    SPM 80    Minutes 15    METs 1.74      Biostep-RELP   Level 1    SPM 50    Minutes 15    METs 1.74      Track   Laps 22    Minutes 15    METs 2.2      Prescription Details   Frequency (times per week) 3    Duration Progress to 30 minutes of continuous aerobic without signs/symptoms of physical distress      Intensity   THRR 40-80% of Max Heartrate 90-124    Ratings of Perceived Exertion 11-13    Perceived Dyspnea 0-4      Progression   Progression Continue to progress workloads to maintain intensity without signs/symptoms of physical distress.      Resistance Training   Training Prescription Yes    Weight 2 lb    Reps 10-15             Perform Capillary Blood Glucose checks as  needed.  Exercise Prescription Changes:   Exercise Prescription Changes     Row Name 03/25/22 1500             Response to Exercise   Blood Pressure (Admit) 122/62       Blood Pressure (Exercise) 146/70       Blood Pressure (Exit) 132/64       Heart Rate (Admit) 58 bpm       Heart Rate (Exercise) 91 bpm       Heart Rate (Exit) 63 bpm       Oxygen Saturation (Admit) 97 %       Oxygen Saturation (Exercise) 96 %       Rating of Perceived Exertion (Exercise) 11       Perceived Dyspnea (Exercise) 1       Symptoms Hip Tightness       Comments 6MWT Results                Exercise Comments:   Exercise Goals and Review:   Exercise Goals     Row Name 03/25/22 1541             Exercise Goals   Increase Physical Activity Yes       Intervention Provide advice, education, support  and counseling about physical activity/exercise needs.;Develop an individualized exercise prescription for aerobic and resistive training based on initial evaluation findings, risk stratification, comorbidities and participant's personal goals.       Expected Outcomes Short Term: Attend rehab on a regular basis to increase amount of physical activity.;Long Term: Add in home exercise to make exercise part of routine and to increase amount of physical activity.;Long Term: Exercising regularly at least 3-5 days a week.       Increase Strength and Stamina Yes       Intervention Provide advice, education, support and counseling about physical activity/exercise needs.;Develop an individualized exercise prescription for aerobic and resistive training based on initial evaluation findings, risk stratification, comorbidities and participant's personal goals.       Expected Outcomes Short Term: Increase workloads from initial exercise prescription for resistance, speed, and METs.;Long Term: Improve cardiorespiratory fitness, muscular endurance and strength as measured by increased METs and functional capacity  (6MWT);Short Term: Perform resistance training exercises routinely during rehab and add in resistance training at home       Able to understand and use rate of perceived exertion (RPE) scale Yes       Intervention Provide education and explanation on how to use RPE scale       Expected Outcomes Short Term: Able to use RPE daily in rehab to express subjective intensity level;Long Term:  Able to use RPE to guide intensity level when exercising independently       Able to understand and use Dyspnea scale Yes       Intervention Provide education and explanation on how to use Dyspnea scale       Expected Outcomes Short Term: Able to use Dyspnea scale daily in rehab to express subjective sense of shortness of breath during exertion;Long Term: Able to use Dyspnea scale to guide intensity level when exercising independently       Knowledge and understanding of Target Heart Rate Range (THRR) Yes       Intervention Provide education and explanation of THRR including how the numbers were predicted and where they are located for reference       Expected Outcomes Short Term: Able to state/look up THRR;Short Term: Able to use daily as guideline for intensity in rehab;Long Term: Able to use THRR to govern intensity when exercising independently       Able to check pulse independently Yes       Intervention Provide education and demonstration on how to check pulse in carotid and radial arteries.;Review the importance of being able to check your own pulse for safety during independent exercise       Expected Outcomes Short Term: Able to explain why pulse checking is important during independent exercise;Long Term: Able to check pulse independently and accurately       Understanding of Exercise Prescription Yes       Intervention Provide education, explanation, and written materials on patient's individual exercise prescription       Expected Outcomes Long Term: Able to explain home exercise prescription to exercise  independently;Short Term: Able to explain program exercise prescription                Exercise Goals Re-Evaluation :   Discharge Exercise Prescription (Final Exercise Prescription Changes):  Exercise Prescription Changes - 03/25/22 1500       Response to Exercise   Blood Pressure (Admit) 122/62    Blood Pressure (Exercise) 146/70    Blood Pressure (Exit) 132/64  Heart Rate (Admit) 58 bpm    Heart Rate (Exercise) 91 bpm    Heart Rate (Exit) 63 bpm    Oxygen Saturation (Admit) 97 %    Oxygen Saturation (Exercise) 96 %    Rating of Perceived Exertion (Exercise) 11    Perceived Dyspnea (Exercise) 1    Symptoms Hip Tightness    Comments 6MWT Results             Nutrition:  Target Goals: Understanding of nutrition guidelines, daily intake of sodium '1500mg'$ , cholesterol '200mg'$ , calories 30% from fat and 7% or less from saturated fats, daily to have 5 or more servings of fruits and vegetables.  Education: All About Nutrition: -Group instruction provided by verbal, written material, interactive activities, discussions, models, and posters to present general guidelines for heart healthy nutrition including fat, fiber, MyPlate, the role of sodium in heart healthy nutrition, utilization of the nutrition label, and utilization of this knowledge for meal planning. Follow up email sent as well. Written material given at graduation.   Biometrics:  Pre Biometrics - 03/25/22 1541       Pre Biometrics   Height '5\' 3"'$  (1.6 m)    Weight 181 lb (82.1 kg)    Waist Circumference 44.5 inches    Hip Circumference 43 inches    Waist to Hip Ratio 1.03 %    BMI (Calculated) 32.07    Single Leg Stand 30 seconds              Nutrition Therapy Plan and Nutrition Goals:  Nutrition Therapy & Goals - 03/25/22 1522       Intervention Plan   Intervention Prescribe, educate and counsel regarding individualized specific dietary modifications aiming towards targeted core components such  as weight, hypertension, lipid management, diabetes, heart failure and other comorbidities.    Expected Outcomes Short Term Goal: Understand basic principles of dietary content, such as calories, fat, sodium, cholesterol and nutrients.;Short Term Goal: A plan has been developed with personal nutrition goals set during dietitian appointment.;Long Term Goal: Adherence to prescribed nutrition plan.             Nutrition Assessments:  MEDIFICTS Score Key: ?70 Need to make dietary changes  40-70 Heart Healthy Diet ? 40 Therapeutic Level Cholesterol Diet  Flowsheet Row Cardiac Rehab from 03/25/2022 in [facility] Cardiac and Pulmonary Rehab  Picture Your Plate Total Score on Admission 66      Picture Your Plate Scores: <19 Unhealthy dietary pattern with much room for improvement. 41-50 Dietary pattern unlikely to meet recommendations for good health and room for improvement. 51-60 More healthful dietary pattern, with some room for improvement.  >60 Healthy dietary pattern, although there may be some specific behaviors that could be improved.    Nutrition Goals Re-Evaluation:   Nutrition Goals Discharge (Final Nutrition Goals Re-Evaluation):   Psychosocial: Target Goals: Acknowledge presence or absence of significant depression and/or stress, maximize coping skills, provide positive support system. Participant is able to verbalize types and ability to use techniques and skills needed for reducing stress and depression.   Education: Stress, Anxiety, and Depression - Group verbal and visual presentation to define topics covered.  Reviews how body is impacted by stress, anxiety, and depression.  Also discusses healthy ways to reduce stress and to treat/manage anxiety and depression.  Written material given at graduation.   Education: Sleep Hygiene -Provides group verbal and written instruction about how sleep can affect your health.  Define sleep hygiene, discuss sleep cycles and impact of  sleep habits. Review good sleep hygiene tips.    Initial Review & Psychosocial Screening:  Initial Psych Review & Screening - 03/12/22 1339       Initial Review   Current issues with Current Stress Concerns    Comments Recent stress over selling property, issues in church, family visiting      [place]? Yes   friends, 2 sons     Barriers   Psychosocial barriers to participate in program There are no identifiable barriers or psychosocial needs.;The patient should benefit from training in stress management and relaxation.      Screening Interventions   Interventions Encouraged to exercise;Provide feedback about the scores to participant;To provide support and resources with identified psychosocial needs    Expected Outcomes Short Term goal: Utilizing psychosocial counselor, staff and physician to assist with identification of specific Stressors or current issues interfering with healing process. Setting desired goal for each stressor or current issue identified.;Long Term Goal: Stressors or current issues are controlled or eliminated.;Long Term goal: The participant improves quality of Life and PHQ9 Scores as seen by post scores and/or verbalization of changes;Short Term goal: Identification and review with participant of any Quality of Life or Depression concerns found by scoring the questionnaire.             Quality of Life Scores:   Quality of Life - 03/25/22 1518       Quality of Life   Select Quality of Life      Quality of Life Scores   Health/Function Pre 22.29 %    Socioeconomic Pre 30 %    Psych/Spiritual Pre 27.43 %    Family Pre 28.5 %    GLOBAL Pre 25.6 %            Scores of 19 and below usually indicate a poorer quality of life in these areas.  A difference of  2-3 points is a clinically meaningful difference.  A difference of 2-3 points in the total score of the Quality of Life Index has been associated with significant  improvement in overall quality of life, self-image, physical symptoms, and general health in studies assessing change in quality of life.  PHQ-9: Review Flowsheet       03/25/2022 05/20/2017  Depression screen PHQ 2/9  Decreased Interest 1 0  Down, Depressed, Hopeless 1 0  PHQ - 2 Score 2 0  Altered sleeping 1 -  Tired, decreased energy 1 -  Change in appetite 0 -  Feeling bad or failure about yourself  0 -  Trouble concentrating 1 -  Moving slowly or fidgety/restless 0 -  Suicidal thoughts 0 -  PHQ-9 Score 5 -  Difficult doing work/chores Not difficult at all -   Interpretation of Total Score  Total Score Depression Severity:  1-4 = Minimal depression, 5-9 = Mild depression, 10-14 = Moderate depression, 15-19 = Moderately severe depression, 20-27 = Severe depression   Psychosocial Evaluation and Intervention:  Psychosocial Evaluation - 03/12/22 1353       Psychosocial Evaluation & Interventions   Interventions Encouraged to exercise with the program and follow exercise prescription;Stress management education;Relaxation education    Comments Teresa Marquez reports she feels like she is healing after her CABG x 1. She has been reducing her help throughout the day and this week does not have anyone coming to help her. She is doing more around the house and feels comfortable with her progress. She hasn't driven yet, but  possibly might this weekend. She reports a lot of stress leading up to her surgery, which included trying to sell a property that was very difficult with the lawyers and families involved. She also states her church has had a lot of issues recently and she had been studying to lead a Bible Study. These events overshadowed a visit from family that she was looking forward to. All in all, she states a lot was happening at the time of the event, so she is ready to ease her way back into her usual routine and work on boosting her stamina. She used to work out at [employer]  consistently and wishes to return to that routine    Expected Outcomes Short: attend cardiac rehab for education and exercise. Long: develop and maintain positive self care habits.    Continue Psychosocial Services  Follow up required by staff             Psychosocial Re-Evaluation:   Psychosocial Discharge (Final Psychosocial Re-Evaluation):   Vocational Rehabilitation: Provide vocational rehab assistance to qualifying candidates.   Vocational Rehab Evaluation & Intervention:  Vocational Rehab - 03/12/22 1338       Initial Vocational Rehab Evaluation & Intervention   Assessment shows need for Vocational Rehabilitation No             Education: Education Goals: Education classes will be provided on a variety of topics geared toward better understanding of heart health and risk factor modification. Participant will state understanding/return demonstration of topics presented as noted by education test scores.  Learning Barriers/Preferences:  Learning Barriers/Preferences - 03/12/22 1338       Learning Barriers/Preferences   Learning Barriers None    Learning Preferences Individual Instruction             General Cardiac Education Topics:  AED/CPR: - Group verbal and written instruction with the use of models to demonstrate the basic use of the AED with the basic ABC's of resuscitation.   Anatomy and Cardiac Procedures: - Group verbal and visual presentation and models provide information about basic cardiac anatomy and function. Reviews the testing methods done to diagnose heart disease and the outcomes of the test results. Describes the treatment choices: Medical Management, Angioplasty, or Coronary Bypass Surgery for treating various heart conditions including Myocardial Infarction, Angina, Valve Disease, and Cardiac Arrhythmias.  Written material given at graduation.   Medication Safety: - Group verbal and visual instruction to review commonly prescribed  medications for heart and lung disease. Reviews the medication, class of the drug, and side effects. Includes the steps to properly store meds and maintain the prescription regimen.  Written material given at graduation.   Intimacy: - Group verbal instruction through game format to discuss how heart and lung disease can affect sexual intimacy. Written material given at graduation..   Know Your Numbers and Heart Failure: - Group verbal and visual instruction to discuss disease risk factors for cardiac and pulmonary disease and treatment options.  Reviews associated critical values for Overweight/Obesity, Hypertension, Cholesterol, and Diabetes.  Discusses basics of heart failure: signs/symptoms and treatments.  Introduces Heart Failure Zone chart for action plan for heart failure.  Written material given at graduation.   Infection Prevention: - Provides verbal and written material to individual with discussion of infection control including proper hand washing and proper equipment cleaning during exercise session. Flowsheet Row Cardiac Rehab from 03/25/2022 in [facility] Cardiac and Pulmonary Rehab  Date 03/25/22  Educator NT  Instruction Review Code 1- [employer]  Understanding       Falls Prevention: - Provides verbal and written material to individual with discussion of falls prevention and safety. Flowsheet Row Cardiac Rehab from 03/25/2022 in [facility] Cardiac and Pulmonary Rehab  Date 03/25/22  Educator NT  Instruction Review Code 1- Verbalizes Understanding       Other: -Provides group and verbal instruction on various topics (see comments)   Knowledge Questionnaire Score:  Knowledge Questionnaire Score - 03/25/22 1518       Knowledge Questionnaire Score   Pre Score 26/26             Core Components/Risk Factors/Patient Goals at Admission:  Personal Goals and Risk Factors at Admission - 03/25/22 1523       Core Components/Risk Factors/Patient Goals on Admission    Weight  Management Yes;Weight Loss    Intervention Weight Management: Develop a combined nutrition and exercise program designed to reach desired caloric intake, while maintaining appropriate intake of nutrient and fiber, sodium and fats, and appropriate energy expenditure required for the weight goal.;Weight Management: Provide education and appropriate resources to help participant work on and attain dietary goals.;Weight Management/Obesity: Establish reasonable short term and long term weight goals.    Admit Weight 181 lb (82.1 kg)    Goal Weight: Short Term 176 lb (79.8 kg)    Goal Weight: Long Term 170 lb (77.1 kg)    Expected Outcomes Short Term: Continue to assess and modify interventions until short term weight is achieved;Long Term: Adherence to nutrition and physical activity/exercise program aimed toward attainment of established weight goal;Weight Loss: Understanding of general recommendations for a balanced deficit meal plan, which promotes 1-2 lb weight loss per week and includes a negative energy balance of [phone] kcal/d;Understanding recommendations for meals to include 15-35% energy as protein, 25-35% energy from fat, 35-60% energy from carbohydrates, less than '200mg'$  of dietary cholesterol, 20-35 gm of total fiber daily;Understanding of distribution of calorie intake throughout the day with the consumption of 4-5 meals/snacks    Diabetes Yes   Diet Controlled   Hypertension Yes    Intervention Provide education on lifestyle modifcations including regular physical activity/exercise, weight management, moderate sodium restriction and increased consumption of fresh fruit, vegetables, and low fat dairy, alcohol moderation, and smoking cessation.;Monitor prescription use compliance.    Expected Outcomes Short Term: Continued assessment and intervention until BP is < 140/75m HG in hypertensive participants. < 130/865mHG in hypertensive participants with diabetes, heart failure or chronic kidney  disease.;Long Term: Maintenance of blood pressure at goal levels.    Lipids Yes    Intervention Provide education and support for participant on nutrition & aerobic/resistive exercise along with prescribed medications to achieve LDL '70mg'$ , HDL >'40mg'$ .    Expected Outcomes Short Term: Participant states understanding of desired cholesterol values and is compliant with medications prescribed. Participant is following exercise prescription and nutrition guidelines.;Long Term: Cholesterol controlled with medications as prescribed, with individualized exercise RX and with personalized nutrition plan. Value goals: LDL < '70mg'$ , HDL > 40 mg.             Education:Diabetes - Individual verbal and written instruction to review signs/symptoms of diabetes, desired ranges of glucose level fasting, after meals and with exercise. Acknowledge that pre and post exercise glucose checks will be done for 3 sessions at entry of program.   Core Components/Risk Factors/Patient Goals Review:    Core Components/Risk Factors/Patient Goals at Discharge (Final Review):    ITP Comments:  ITP Comments  [place] Name 03/12/22 1344 03/25/22 1515         ITP Comments Initial telephone completed. Diagnosis can be found in [facility] 8/7. EP orientation scheduled for Monday 9/18 at 1:30 Completed 6MWT and gym orientation. Initial ITP created and sent for review to Dr. Emily Filbert, Medical Director.               Comments: Initial ITP

## 2022-03-26 DIAGNOSIS — R5383 Other fatigue: Secondary | ICD-10-CM | POA: Diagnosis not present

## 2022-03-26 DIAGNOSIS — G720 Drug-induced myopathy: Secondary | ICD-10-CM | POA: Diagnosis not present

## 2022-03-26 DIAGNOSIS — Z0000 Encounter for general adult medical examination without abnormal findings: Secondary | ICD-10-CM | POA: Diagnosis not present

## 2022-03-26 DIAGNOSIS — K760 Fatty (change of) liver, not elsewhere classified: Secondary | ICD-10-CM | POA: Diagnosis not present

## 2022-03-26 DIAGNOSIS — E782 Mixed hyperlipidemia: Secondary | ICD-10-CM | POA: Diagnosis not present

## 2022-03-26 DIAGNOSIS — N182 Chronic kidney disease, stage 2 (mild): Secondary | ICD-10-CM | POA: Diagnosis not present

## 2022-03-26 DIAGNOSIS — E1165 Type 2 diabetes mellitus with hyperglycemia: Secondary | ICD-10-CM | POA: Diagnosis not present

## 2022-04-01 ENCOUNTER — Encounter: Payer: Medicare Other | Admitting: *Deleted

## 2022-04-01 DIAGNOSIS — Z48812 Encounter for surgical aftercare following surgery on the circulatory system: Secondary | ICD-10-CM | POA: Diagnosis not present

## 2022-04-01 DIAGNOSIS — Z951 Presence of aortocoronary bypass graft: Secondary | ICD-10-CM

## 2022-04-01 NOTE — Progress Notes (Signed)
Daily Session Note  Patient Details  Name: Teresa Marquez MRN: [id number] Date of Birth: [birth year] Referring Provider:   Flowsheet Row Cardiac Rehab from 03/25/2022 in [facility] Cardiac and Pulmonary Rehab  Referring Provider Burt Knack       Encounter Date: 04/01/2022  Check In:  Session Check In - 04/01/22 0940       Check-In   Supervising physician immediately available to respond to emergencies See telemetry face sheet for immediately available ER MD    Location [facility]-Cardiac & Pulmonary Rehab    Staff Present Heath Lark, RN, BSN, Laveda Norman, BS, ACSM CEP, Exercise Physiologist;Noah Tickle, BS, Exercise Physiologist;Other   Darlyne Russian RN, [place]   Virtual Visit No    Medication changes reported     No    Fall or balance concerns reported    No    Warm-up and Cool-down Performed on first and last piece of equipment    Resistance Training Performed Yes    VAD Patient? No    PAD/SET Patient? No      Pain Assessment   Currently in Pain? No/denies                Social History   Tobacco Use  Smoking Status Never  Smokeless Tobacco Never    Goals Met:  Exercise tolerated well Personal goals reviewed No report of concerns or symptoms today Strength training completed today  Goals Unmet:  Not Applicable  Comments: First full day of exercise!  Patient was oriented to gym and equipment including functions, settings, policies, and procedures.  Patient's individual exercise prescription and treatment plan were reviewed.  All starting workloads were established based on the results of the 6 minute walk test done at initial orientation visit.  The plan for exercise progression was also introduced and progression will be customized based on patient's performance and goals.    Dr. Emily Filbert is Medical Director for [place].  Dr. Ottie Glazier is Medical Director for [facility] Pulmonary Rehabilitation.

## 2022-04-02 DIAGNOSIS — E782 Mixed hyperlipidemia: Secondary | ICD-10-CM | POA: Diagnosis not present

## 2022-04-02 DIAGNOSIS — I700 Atherosclerosis of aorta: Secondary | ICD-10-CM | POA: Diagnosis not present

## 2022-04-02 DIAGNOSIS — N182 Chronic kidney disease, stage 2 (mild): Secondary | ICD-10-CM | POA: Diagnosis not present

## 2022-04-02 DIAGNOSIS — G720 Drug-induced myopathy: Secondary | ICD-10-CM | POA: Diagnosis not present

## 2022-04-02 DIAGNOSIS — I2510 Atherosclerotic heart disease of native coronary artery without angina pectoris: Secondary | ICD-10-CM | POA: Diagnosis not present

## 2022-04-02 DIAGNOSIS — K760 Fatty (change of) liver, not elsewhere classified: Secondary | ICD-10-CM | POA: Diagnosis not present

## 2022-04-02 DIAGNOSIS — E1122 Type 2 diabetes mellitus with diabetic chronic kidney disease: Secondary | ICD-10-CM | POA: Diagnosis not present

## 2022-04-03 ENCOUNTER — Encounter: Payer: Medicare Other | Admitting: *Deleted

## 2022-04-03 DIAGNOSIS — Z48812 Encounter for surgical aftercare following surgery on the circulatory system: Secondary | ICD-10-CM | POA: Diagnosis not present

## 2022-04-03 DIAGNOSIS — Z951 Presence of aortocoronary bypass graft: Secondary | ICD-10-CM

## 2022-04-03 NOTE — Progress Notes (Signed)
Daily Session Note  Patient Details  Name: Teresa Marquez MRN: [id number] Date of Birth: Mar 30, 1941 Referring Provider:   Flowsheet Row Cardiac Rehab from 03/25/2022 in [facility] Cardiac and Pulmonary Rehab  Referring Provider Burt Knack       Encounter Date: 04/03/2022  Check In:  Session Check In - 04/03/22 0935       Check-In   Supervising physician immediately available to respond to emergencies See telemetry face sheet for immediately available ER MD    Location [facility]-Cardiac & Pulmonary Rehab    Staff Present Justin Mend, RCP,RRT,BSRT;Noah Tickle, BS, Exercise Physiologist;Other   Darlyne Russian, RN   Virtual Visit No    Medication changes reported     No    Fall or balance concerns reported    No    Warm-up and Cool-down Performed on first and last piece of equipment    Resistance Training Performed Yes    VAD Patient? No    PAD/SET Patient? No      Pain Assessment   Currently in Pain? No/denies                Social History   Tobacco Use  Smoking Status Never  Smokeless Tobacco Never    Goals Met:  Independence with exercise equipment Exercise tolerated well No report of concerns or symptoms today Strength training completed today  Goals Unmet:  Not Applicable  Comments: Pt able to follow exercise prescription today without complaint.  Will continue to monitor for progression.    Dr. Emily Filbert is Medical Director for [place].  Dr. Ottie Glazier is Medical Director for [facility] Pulmonary Rehabilitation.

## 2022-04-05 ENCOUNTER — Encounter: Payer: Medicare Other | Admitting: *Deleted

## 2022-04-05 DIAGNOSIS — Z951 Presence of aortocoronary bypass graft: Secondary | ICD-10-CM

## 2022-04-05 DIAGNOSIS — Z48812 Encounter for surgical aftercare following surgery on the circulatory system: Secondary | ICD-10-CM | POA: Diagnosis not present

## 2022-04-05 NOTE — Progress Notes (Signed)
Daily Session Note  Patient Details  Name: Teresa Marquez MRN: [id number] Date of Birth: 1941-06-29 Referring Provider:   Flowsheet Row Cardiac Rehab from 03/25/2022 in [facility] Cardiac and Pulmonary Rehab  Referring Provider Burt Knack       Encounter Date: 04/05/2022  Check In:  Session Check In - 04/05/22 1001       Check-In   Supervising physician immediately available to respond to emergencies See telemetry face sheet for immediately available ER MD    Location [facility]-Cardiac & Pulmonary Rehab    Staff Present Heath Lark, RN, BSN, CCRP;Jessica [place], MA, RCEP, CCRP, CCET;Joseph Lynch, [place]    Virtual Visit No    Medication changes reported     No    Fall or balance concerns reported    No    Warm-up and Cool-down Performed on first and last piece of equipment    Resistance Training Performed Yes    VAD Patient? No    PAD/SET Patient? No      Pain Assessment   Currently in Pain? No/denies                Social History   Tobacco Use  Smoking Status Never  Smokeless Tobacco Never    Goals Met:  Independence with exercise equipment Exercise tolerated well No report of concerns or symptoms today  Goals Unmet:  Not Applicable  Comments: Pt able to follow exercise prescription today without complaint.  Will continue to monitor for progression.    Dr. Emily Filbert is Medical Director for [place].  Dr. Ottie Glazier is Medical Director for [facility] Pulmonary Rehabilitation.

## 2022-04-08 ENCOUNTER — Encounter: Payer: Medicare Other | Attending: Cardiovascular Disease | Admitting: *Deleted

## 2022-04-08 DIAGNOSIS — Z48812 Encounter for surgical aftercare following surgery on the circulatory system: Secondary | ICD-10-CM | POA: Diagnosis not present

## 2022-04-08 DIAGNOSIS — Z951 Presence of aortocoronary bypass graft: Secondary | ICD-10-CM | POA: Insufficient documentation

## 2022-04-08 NOTE — Progress Notes (Signed)
Daily Session Note  Patient Details  Name: KIHANNA KAMIYA MRN: [id number] Date of Birth: [birth year] Referring Provider:   Flowsheet Row Cardiac Rehab from 03/25/2022 in [facility] Cardiac and Pulmonary Rehab  Referring Provider Burt Knack       Encounter Date: 04/08/2022  Check In:  Session Check In - 04/08/22 0929       Check-In   Supervising physician immediately available to respond to emergencies See telemetry face sheet for immediately available ER MD    Location [facility]-Cardiac & Pulmonary Rehab    Staff Present Earlean Shawl, BS, ACSM CEP, Exercise Physiologist;Noah Tickle, BS, Exercise Physiologist;Emre Stock Tamala Julian, RN, ADN    Virtual Visit No    Medication changes reported     No    Fall or balance concerns reported    No    Warm-up and Cool-down Performed on first and last piece of equipment    Resistance Training Performed Yes    VAD Patient? No    PAD/SET Patient? No      Pain Assessment   Currently in Pain? No/denies                Social History   Tobacco Use  Smoking Status Never  Smokeless Tobacco Never    Goals Met:  Independence with exercise equipment Exercise tolerated well No report of concerns or symptoms today Strength training completed today  Goals Unmet:  Not Applicable  Comments: Pt able to follow exercise prescription today without complaint.  Will continue to monitor for progression.    Dr. Emily Filbert is Medical Director for [place].  Dr. Ottie Glazier is Medical Director for [facility] Pulmonary Rehabilitation.

## 2022-04-10 ENCOUNTER — Encounter: Payer: Self-pay | Admitting: *Deleted

## 2022-04-10 ENCOUNTER — Encounter: Payer: Medicare Other | Admitting: *Deleted

## 2022-04-10 DIAGNOSIS — Z951 Presence of aortocoronary bypass graft: Secondary | ICD-10-CM

## 2022-04-10 DIAGNOSIS — Z48812 Encounter for surgical aftercare following surgery on the circulatory system: Secondary | ICD-10-CM | POA: Diagnosis not present

## 2022-04-10 NOTE — Progress Notes (Signed)
Daily Session Note  Patient Details  Name: Teresa Marquez MRN: [id number] Date of Birth: 1940/08/07 Referring Provider:   Flowsheet Row Cardiac Rehab from 03/25/2022 in [facility] Cardiac and Pulmonary Rehab  Referring Provider Burt Knack       Encounter Date: 04/10/2022  Check In:  Session Check In - 04/10/22 0914       Check-In   Supervising physician immediately available to respond to emergencies See telemetry face sheet for immediately available ER MD    Location [facility]-Cardiac & Pulmonary Rehab    Staff Present Antionette Fairy, BS, Exercise Physiologist;Joseph Rosebud Poles, RN, [place]    Virtual Visit No    Medication changes reported     No    Fall or balance concerns reported    No    Warm-up and Cool-down Performed on first and last piece of equipment    Resistance Training Performed Yes    VAD Patient? No    PAD/SET Patient? No      Pain Assessment   Currently in Pain? No/denies                Social History   Tobacco Use  Smoking Status Never  Smokeless Tobacco Never    Goals Met:  Independence with exercise equipment Exercise tolerated well No report of concerns or symptoms today Strength training completed today  Goals Unmet:  Not Applicable  Comments: Pt able to follow exercise prescription today without complaint.  Will continue to monitor for progression.    Dr. Emily Filbert is Medical Director for [place].  Dr. Ottie Glazier is Medical Director for [facility] Pulmonary Rehabilitation.

## 2022-04-10 NOTE — Progress Notes (Signed)
Cardiac Individual Treatment Plan  Patient Details  Name: Teresa Marquez MRN: [id number] Date of Birth: 1940/12/09 Referring Provider:   Flowsheet Row Cardiac Rehab from 03/25/2022 in [facility] Cardiac and Pulmonary Rehab  Referring Provider Burt Knack       Initial Encounter Date:  Flowsheet Row Cardiac Rehab from 03/25/2022 in [facility] Cardiac and Pulmonary Rehab  Date 03/25/22       Visit Diagnosis: S/P CABG x 1  Patient's Home Medications on Admission:  Current Outpatient Medications:    acetaminophen (TYLENOL) 500 MG tablet, Take 500 mg by mouth every 6 (six) hours as needed for moderate pain., Disp: , Rfl:    amiodarone (PACERONE) 200 MG tablet, Take 1 tablet (200 mg total) by mouth 2 (two) times daily. X 7 days, then decrease to 200 mg daily, Disp: 60 tablet, Rfl: 1   aspirin 81 MG chewable tablet, Chew 81 mg by mouth daily., Disp: , Rfl:    atorvastatin (LIPITOR) 80 MG tablet, Take 1 tablet (80 mg total) by mouth daily., Disp: 30 tablet, Rfl: 3   ezetimibe (ZETIA) 10 MG tablet, Take 10 mg by mouth daily., Disp: , Rfl:    fluticasone (FLONASE) 50 MCG/ACT nasal spray, Place 2 sprays into both nostrils every evening., Disp: , Rfl:    folic acid (FOLVITE) 841 MCG tablet, Take 400 mcg daily by mouth., Disp: , Rfl:    metoprolol tartrate (LOPRESSOR) 25 MG tablet, Take 0.5 tablets (12.5 mg total) by mouth 2 (two) times daily., Disp: 60 tablet, Rfl: 3   MISC NATURAL PRODUCTS PO, Take 1 Application by mouth daily. Milk Thistle, Disp: , Rfl:    Multiple Vitamins-Minerals (HAIR SKIN & NAILS ADVANCED PO), Take 1 tablet by mouth daily., Disp: , Rfl:    omeprazole (PRILOSEC) 20 MG capsule, Take 10 mg by mouth every evening., Disp: , Rfl:    Polyvinyl Alcohol-Povidone (REFRESH OP), Place 1 drop into both eyes daily., Disp: , Rfl:    Pseudoeph-Bromphen-DM (BROMFED DM PO), 5 ml as needed, Disp: , Rfl:    Turmeric 500 MG CAPS, Take 2 capsules by mouth daily., Disp: , Rfl:    vitamin B-12  (CYANOCOBALAMIN) 1000 MCG tablet, Take 1,000 mcg daily by mouth., Disp: , Rfl:   Past Medical History: Past Medical History:  Diagnosis Date   Abdominal pain    Anxiety    Basal cell carcinoma    DDD (degenerative disc disease), lumbar    Diabetes mellitus without complication ([place])    DM (diabetes mellitus) ([place])    Drug-induced myopathy    Elevated blood pressure reading in office without diagnosis of hypertension    GERD (gastroesophageal reflux disease)    Hyperlipidemia    Menopause    NAFLD (nonalcoholic fatty liver disease)    OSA (obstructive sleep apnea)     Tobacco Use: Social History   Tobacco Use  Smoking Status Never  Smokeless Tobacco Never    Labs: Review Flowsheet       Latest Ref Rng & Units 02/11/2022 02/12/2022 02/13/2022  Labs for ITP Cardiac and Pulmonary Rehab  Cholestrol 0 - 200 mg/dL - 174  -  LDL (calc) 0 - 99 mg/dL - 106  -  HDL-C >40 mg/dL - 36  -  Trlycerides <150 mg/dL - 160  -  Hemoglobin A1c 4.8 - 5.6 % - 7.0  -  PH, Arterial 7.35 - 7.45 - - 7.311  7.357  7.322  7.421  7.461  7.434  7.372   PCO2 arterial  32 - 48 mmHg - - 43.8  47.0  44.6  37.2  37.9  36.9  45.8   Bicarbonate 20.0 - 28.0 mmol/L - - 22.2  26.6  23.4  24.2  27.0  24.7  26.6   TCO2 22 - 32 mmol/L 24  - _0 Acid-base deficit 0.0 - 2.0 mmol/L - - 4.0  3.0   O2 Saturation % - - 95  99  95  95  100  100  100      Exercise Target Goals: Exercise Program Goal: Individual exercise prescription set using results from initial 6 min walk test and THRR while considering  patient's activity barriers and safety.   Exercise Prescription Goal: Initial exercise prescription builds to 30-45 minutes a day of aerobic activity, 2-3 days per week.  Home exercise guidelines will be given to patient during program as part of exercise prescription that the participant will acknowledge.   Education: Aerobic Exercise: - Group verbal and visual presentation  on the components of exercise prescription. Introduces F.I.T.T principle from ACSM for exercise prescriptions.  Reviews F.I.T.T. principles of aerobic exercise including progression. Written material given at graduation.   Education: Resistance Exercise: - Group verbal and visual presentation on the components of exercise prescription. Introduces F.I.T.T principle from ACSM for exercise prescriptions  Reviews F.I.T.T. principles of resistance exercise including progression. Written material given at graduation.    Education: Exercise & Equipment Safety: - Individual verbal instruction and demonstration of equipment use and safety with use of the equipment. Flowsheet Row Cardiac Rehab from 04/03/2022 in [facility] Cardiac and Pulmonary Rehab  Date 03/25/22  Educator NT  Instruction Review Code 1- Verbalizes Understanding       Education: Exercise Physiology & General Exercise Guidelines: - Group verbal and written instruction with models to review the exercise physiology of the cardiovascular system and associated critical values. Provides general exercise guidelines with specific guidelines to those with heart or lung disease.    Education: Flexibility, Balance, Mind/Body Relaxation: - Group verbal and visual presentation with interactive activity on the components of exercise prescription. Introduces F.I.T.T principle from ACSM for exercise prescriptions. Reviews F.I.T.T. principles of flexibility and balance exercise training including progression. Also discusses the mind body connection.  Reviews various relaxation techniques to help reduce and manage stress (i.e. Deep breathing, progressive muscle relaxation, and visualization). Balance handout provided to take home. Written material given at graduation.   Activity Barriers & Risk Stratification:  Activity Barriers & Cardiac Risk Stratification - 03/25/22 1526       Activity Barriers & Cardiac Risk Stratification   Activity Barriers  None;Muscular Weakness;Deconditioning;Other (comment)    Comments Hip Fatigue/Pain    Cardiac Risk Stratification Moderate             6 Minute Walk:  6 Minute Walk     Row Name 03/25/22 1525         6 Minute Walk   Phase Initial     Distance 1130 feet     Walk Time 6 minutes     # of Rest Breaks 0     MPH 2.12     METS 1.74     RPE 11     Perceived Dyspnea  1     VO2 Peak 6.09     Symptoms Yes (comment)     Comments Hip tightness  Resting HR 58 bpm     Resting BP 122/62     Resting Oxygen Saturation  97 %     Exercise Oxygen Saturation  during 6 min walk 96 %     Max Ex. HR 91 bpm     Max Ex. BP 146/70     2 Minute Post BP 132/64              Oxygen Initial Assessment:   Oxygen Re-Evaluation:   Oxygen Discharge (Final Oxygen Re-Evaluation):   Initial Exercise Prescription:  Initial Exercise Prescription - 03/25/22 1500       Date of Initial Exercise RX and Referring Provider   Date 03/25/22    Referring Provider Burt Knack      Oxygen   Maintain Oxygen Saturation 88% or higher      Bike   Level 1    Watts 15    Minutes 15    METs 1.74      NuStep   Level 1    SPM 80    Minutes 15    METs 1.74      T5 Nustep   Level 1    SPM 80    Minutes 15    METs 1.74      Biostep-RELP   Level 1    SPM 50    Minutes 15    METs 1.74      Track   Laps 22    Minutes 15    METs 2.2      Prescription Details   Frequency (times per week) 3    Duration Progress to 30 minutes of continuous aerobic without signs/symptoms of physical distress      Intensity   THRR 40-80% of Max Heartrate 90-124    Ratings of Perceived Exertion 11-13    Perceived Dyspnea 0-4      Progression   Progression Continue to progress workloads to maintain intensity without signs/symptoms of physical distress.      Resistance Training   Training Prescription Yes    Weight 2 lb    Reps 10-15             Perform Capillary Blood Glucose checks as  needed.  Exercise Prescription Changes:   Exercise Prescription Changes     Row Name 03/25/22 1500 04/08/22 1100           Response to Exercise   Blood Pressure (Admit) 122/62 122/60      Blood Pressure (Exercise) 146/70 168/76      Blood Pressure (Exit) 132/64 110/58      Heart Rate (Admit) 58 bpm 66 bpm      Heart Rate (Exercise) 91 bpm 96 bpm      Heart Rate (Exit) 63 bpm 63 bpm      Oxygen Saturation (Admit) 97 % --      Oxygen Saturation (Exercise) 96 % --      Rating of Perceived Exertion (Exercise) 11 12      Perceived Dyspnea (Exercise) 1 --      Symptoms Hip Tightness none      Comments 6MWT Results 3rd full day of exercise      Duration -- Progress to 30 minutes of  aerobic without signs/symptoms of physical distress      Intensity -- THRR unchanged        Progression   Progression -- Continue to progress workloads to maintain intensity without signs/symptoms of physical distress.      Average  METs -- 2.27        Resistance Training   Training Prescription -- Yes      Weight -- 2 lb      Reps -- 10-15        Interval Training   Interval Training -- No        NuStep   Level -- 3      Minutes -- 15        Biostep-RELP   Level -- 3      Minutes -- 15      METs -- 3        Track   Laps -- 26      Minutes -- 15      METs -- 2.41        Oxygen   Maintain Oxygen Saturation -- 88% or higher               Exercise Comments:   Exercise Comments     Row Name 04/01/22 0941           Exercise Comments First full day of exercise!  Patient was oriented to gym and equipment including functions, settings, policies, and procedures.  Patient's individual exercise prescription and treatment plan were reviewed.  All starting workloads were established based on the results of the 6 minute walk test done at initial orientation visit.  The plan for exercise progression was also introduced and progression will be customized based on patient's performance and  goals.                Exercise Goals and Review:   Exercise Goals     Row Name 03/25/22 1541             Exercise Goals   Increase Physical Activity Yes       Intervention Provide advice, education, support and counseling about physical activity/exercise needs.;Develop an individualized exercise prescription for aerobic and resistive training based on initial evaluation findings, risk stratification, comorbidities and participant's personal goals.       Expected Outcomes Short Term: Attend rehab on a regular basis to increase amount of physical activity.;Long Term: Add in home exercise to make exercise part of routine and to increase amount of physical activity.;Long Term: Exercising regularly at least 3-5 days a week.       Increase Strength and Stamina Yes       Intervention Provide advice, education, support and counseling about physical activity/exercise needs.;Develop an individualized exercise prescription for aerobic and resistive training based on initial evaluation findings, risk stratification, comorbidities and participant's personal goals.       Expected Outcomes Short Term: Increase workloads from initial exercise prescription for resistance, speed, and METs.;Long Term: Improve cardiorespiratory fitness, muscular endurance and strength as measured by increased METs and functional capacity (6MWT);Short Term: Perform resistance training exercises routinely during rehab and add in resistance training at home       Able to understand and use rate of perceived exertion (RPE) scale Yes       Intervention Provide education and explanation on how to use RPE scale       Expected Outcomes Short Term: Able to use RPE daily in rehab to express subjective intensity level;Long Term:  Able to use RPE to guide intensity level when exercising independently       Able to understand and use Dyspnea scale Yes       Intervention Provide education and explanation on how to use Dyspnea scale  Expected Outcomes Short Term: Able to use Dyspnea scale daily in rehab to express subjective sense of shortness of breath during exertion;Long Term: Able to use Dyspnea scale to guide intensity level when exercising independently       Knowledge and understanding of Target Heart Rate Range (THRR) Yes       Intervention Provide education and explanation of THRR including how the numbers were predicted and where they are located for reference       Expected Outcomes Short Term: Able to state/look up THRR;Short Term: Able to use daily as guideline for intensity in rehab;Long Term: Able to use THRR to govern intensity when exercising independently       Able to check pulse independently Yes       Intervention Provide education and demonstration on how to check pulse in carotid and radial arteries.;Review the importance of being able to check your own pulse for safety during independent exercise       Expected Outcomes Short Term: Able to explain why pulse checking is important during independent exercise;Long Term: Able to check pulse independently and accurately       Understanding of Exercise Prescription Yes       Intervention Provide education, explanation, and written materials on patient's individual exercise prescription       Expected Outcomes Long Term: Able to explain home exercise prescription to exercise independently;Short Term: Able to explain program exercise prescription                Exercise Goals Re-Evaluation :  Exercise Goals Re-Evaluation     Row Name 04/01/22 0941 04/08/22 1111           Exercise Goal Re-Evaluation   Exercise Goals Review Increase Physical Activity;Increase Strength and Stamina;Able to understand and use rate of perceived exertion (RPE) scale;Able to understand and use Dyspnea scale;Knowledge and understanding of Target Heart Rate Range (THRR);Understanding of Exercise Prescription Increase Physical Activity;Increase Strength and Stamina;Understanding  of Exercise Prescription      Comments Reviewed RPE and dyspnea scales, THR and program prescription with pt today.  Pt voiced understanding and was given a copy of goals to take home. Teresa Marquez is doing well for the first couple sessions she has been here.  She was able to walk 26 laps on the track and has already increased her level on both the BS and T4 Nustep. We will continue to monitor as she progresses in the program.      Expected Outcomes Short: Use RPE daily to regulate intensity. Long: Follow program prescription in THR. Short: Continue to increase laps on track Long: Continue to increase overall MET level               Discharge Exercise Prescription (Final Exercise Prescription Changes):  Exercise Prescription Changes - 04/08/22 1100       Response to Exercise   Blood Pressure (Admit) 122/60    Blood Pressure (Exercise) 168/76    Blood Pressure (Exit) 110/58    Heart Rate (Admit) 66 bpm    Heart Rate (Exercise) 96 bpm    Heart Rate (Exit) 63 bpm    Rating of Perceived Exertion (Exercise) 12    Symptoms none    Comments 3rd full day of exercise    Duration Progress to 30 minutes of  aerobic without signs/symptoms of physical distress    Intensity THRR unchanged      Progression   Progression Continue to progress workloads to maintain intensity without signs/symptoms of physical  distress.    Average METs 2.27      Resistance Training   Training Prescription Yes    Weight 2 lb    Reps 10-15      Interval Training   Interval Training No      NuStep   Level 3    Minutes 15      Biostep-RELP   Level 3    Minutes 15    METs 3      Track   Laps 26    Minutes 15    METs 2.41      Oxygen   Maintain Oxygen Saturation 88% or higher             Nutrition:  Target Goals: Understanding of nutrition guidelines, daily intake of sodium <1512m, cholesterol <2064m calories 30% from fat and 7% or less from saturated fats, daily to have 5 or more servings of  fruits and vegetables.  Education: All About Nutrition: -Group instruction provided by verbal, written material, interactive activities, discussions, models, and posters to present general guidelines for heart healthy nutrition including fat, fiber, MyPlate, the role of sodium in heart healthy nutrition, utilization of the nutrition label, and utilization of this knowledge for meal planning. Follow up email sent as well. Written material given at graduation. Flow[facility]  Date 04/03/22  Educator [facility] LLCInstruction Review Code 1- Verbalizes Understanding       Biometrics:  Pre Biometrics - 03/25/22 1541       Pre Biometrics   Height 5' 3" (1.6 m)    Weight 181 lb (82.1 kg)    Waist Circumference 44.5 inches    Hip Circumference 43 inches    Waist to Hip Ratio 1.03 %    BMI (Calculated) 32.07    Single Leg Stand 30 seconds              Nutrition Therapy Plan and Nutrition Goals:  Nutrition Therapy & Goals - 04/08/22 0939       Nutrition Therapy   RD appointment deferred Yes   ChAngalinas interested in meeting with RD, but would like to delay scheduling at this time. Will continue to follow up.     Personal Nutrition Goals   Nutrition Goal ChJaidalyns interested in meeting with RD, but would like to delay scheduling at this time. Will continue to follow up.             Nutrition Assessments:  MEDIFICTS Score Key: ?70 Need to make dietary changes  40-70 Heart Healthy Diet ? 40 Therapeutic Level Cholesterol Diet  [facility]  Picture Your Plate Total Score on Admission 66      Picture Your Plate Scores: <4<44nhealthy dietary pattern with much room for improvement. 41-50 Dietary pattern unlikely to meet recommendations for good health and room for improvement. 51-60 More healthful dietary pattern, with some room for improvement.   >60 Healthy dietary pattern, although there may be some specific behaviors that could be improved.    Nutrition Goals Re-Evaluation:   Nutrition Goals Discharge (Final Nutrition Goals Re-Evaluation):   Psychosocial: Target Goals: Acknowledge presence or absence of significant depression and/or stress, maximize coping skills, provide positive support system. Participant is able to verbalize types and ability to use techniques and skills needed for reducing stress and depression.   Education: Stress, Anxiety, and Depression - Group verbal and visual  presentation to define topics covered.  Reviews how body is impacted by stress, anxiety, and depression.  Also discusses healthy ways to reduce stress and to treat/manage anxiety and depression.  Written material given at graduation.   Education: Sleep Hygiene -Provides group verbal and written instruction about how sleep can affect your health.  Define sleep hygiene, discuss sleep cycles and impact of sleep habits. Review good sleep hygiene tips.    Initial Review & Psychosocial Screening:  Initial Psych Review & Screening - 03/12/22 1339       Initial Review   Current issues with Current Stress Concerns    Comments Recent stress over selling property, issues in church, family visiting      [place]? Yes   friends, 2 sons     Barriers   Psychosocial barriers to participate in program There are no identifiable barriers or psychosocial needs.;The patient should benefit from training in stress management and relaxation.      Screening Interventions   Interventions Encouraged to exercise;Provide feedback about the scores to participant;To provide support and resources with identified psychosocial needs    Expected Outcomes Short Term goal: Utilizing psychosocial counselor, staff and physician to assist with identification of specific Stressors or current issues interfering with healing process. Setting desired  goal for each stressor or current issue identified.;Long Term Goal: Stressors or current issues are controlled or eliminated.;Long Term goal: The participant improves quality of Life and PHQ9 Scores as seen by post scores and/or verbalization of changes;Short Term goal: Identification and review with participant of any Quality of Life or Depression concerns found by scoring the questionnaire.             Quality of Life Scores:   Quality of Life - 03/25/22 1518       Quality of Life   Select Quality of Life      Quality of Life Scores   Health/Function Pre 22.29 %    Socioeconomic Pre 30 %    Psych/Spiritual Pre 27.43 %    Family Pre 28.5 %    GLOBAL Pre 25.6 %            Scores of 19 and below usually indicate a poorer quality of life in these areas.  A difference of  2-3 points is a clinically meaningful difference.  A difference of 2-3 points in the total score of the Quality of Life Index has been associated with significant improvement in overall quality of life, self-image, physical symptoms, and general health in studies assessing change in quality of life.  PHQ-9: Review Flowsheet       03/25/2022 05/20/2017  Depression screen PHQ 2/9  Decreased Interest 1 0  Down, Depressed, Hopeless 1 0  PHQ - 2 Score 2 0  Altered sleeping 1 -  Tired, decreased energy 1 -  Change in appetite 0 -  Feeling bad or failure about yourself  0 -  Trouble concentrating 1 -  Moving slowly or fidgety/restless 0 -  Suicidal thoughts 0 -  PHQ-9 Score 5 -  Difficult doing work/chores Not difficult at all -   Interpretation of Total Score  Total Score Depression Severity:  1-4 = Minimal depression, 5-9 = Mild depression, 10-14 = Moderate depression, 15-19 = Moderately severe depression, 20-27 = Severe depression   Psychosocial Evaluation and Intervention:  Psychosocial Evaluation - 03/12/22 1353       Psychosocial Evaluation & Interventions   Interventions Encouraged to exercise  with  the program and follow exercise prescription;Stress management education;Relaxation education    Comments Teresa Marquez reports she feels like she is healing after her CABG x 1. She has been reducing her help throughout the day and this week does not have anyone coming to help her. She is doing more around the house and feels comfortable with her progress. She hasn't driven yet, but possibly might this weekend. She reports a lot of stress leading up to her surgery, which included trying to sell a property that was very difficult with the lawyers and families involved. She also states her church has had a lot of issues recently and she had been studying to lead a Bible Study. These events overshadowed a visit from family that she was looking forward to. All in all, she states a lot was happening at the time of the event, so she is ready to ease her way back into her usual routine and work on boosting her stamina. She used to work out at [employer] consistently and wishes to return to that routine    Expected Outcomes Short: attend cardiac rehab for education and exercise. Long: develop and maintain positive self care habits.    Continue Psychosocial Services  Follow up required by staff             Psychosocial Re-Evaluation:   Psychosocial Discharge (Final Psychosocial Re-Evaluation):   Vocational Rehabilitation: Provide vocational rehab assistance to qualifying candidates.   Vocational Rehab Evaluation & Intervention:  Vocational Rehab - 03/12/22 1338       Initial Vocational Rehab Evaluation & Intervention   Assessment shows need for Vocational Rehabilitation No             Education: Education Goals: Education classes will be provided on a variety of topics geared toward better understanding of heart health and risk factor modification. Participant will state understanding/return demonstration of topics presented as noted by education test scores.  Learning  Barriers/Preferences:  Learning Barriers/Preferences - 03/12/22 1338       Learning Barriers/Preferences   Learning Barriers None    Learning Preferences Individual Instruction             General Cardiac Education Topics:  AED/CPR: - Group verbal and written instruction with the use of models to demonstrate the basic use of the AED with the basic ABC's of resuscitation.   Anatomy and Cardiac Procedures: - Group verbal and visual presentation and models provide information about basic cardiac anatomy and function. Reviews the testing methods done to diagnose heart disease and the outcomes of the test results. Describes the treatment choices: Medical Management, Angioplasty, or Coronary Bypass Surgery for treating various heart conditions including Myocardial Infarction, Angina, Valve Disease, and Cardiac Arrhythmias.  Written material given at graduation.   Medication Safety: - Group verbal and visual instruction to review commonly prescribed medications for heart and lung disease. Reviews the medication, class of the drug, and side effects. Includes the steps to properly store meds and maintain the prescription regimen.  Written material given at graduation.   Intimacy: - Group verbal instruction through game format to discuss how heart and lung disease can affect sexual intimacy. Written material given at graduation..   Know Your Numbers and Heart Failure: - Group verbal and visual instruction to discuss disease risk factors for cardiac and pulmonary disease and treatment options.  Reviews associated critical values for Overweight/Obesity, Hypertension, Cholesterol, and Diabetes.  Discusses basics of heart failure: signs/symptoms and treatments.  Introduces Heart Failure Zone chart  for action plan for heart failure.  Written material given at graduation.   Infection Prevention: - Provides verbal and written material to individual with discussion of infection control including  proper hand washing and proper equipment cleaning during exercise session. Flowsheet Row Cardiac Rehab from 04/03/2022 in [facility] Cardiac and Pulmonary Rehab  Date 03/25/22  Educator NT  Instruction Review Code 1- Verbalizes Understanding       Falls Prevention: - Provides verbal and written material to individual with discussion of falls prevention and safety. Flowsheet Row Cardiac Rehab from 04/03/2022 in [facility] Cardiac and Pulmonary Rehab  Date 03/25/22  Educator NT  Instruction Review Code 1- Verbalizes Understanding       Other: -Provides group and verbal instruction on various topics (see comments)   Knowledge Questionnaire Score:  Knowledge Questionnaire Score - 03/25/22 1518       Knowledge Questionnaire Score   Pre Score 26/26             Core Components/Risk Factors/Patient Goals at Admission:  Personal Goals and Risk Factors at Admission - 03/25/22 1523       Core Components/Risk Factors/Patient Goals on Admission    Weight Management Yes;Weight Loss    Intervention Weight Management: Develop a combined nutrition and exercise program designed to reach desired caloric intake, while maintaining appropriate intake of nutrient and fiber, sodium and fats, and appropriate energy expenditure required for the weight goal.;Weight Management: Provide education and appropriate resources to help participant work on and attain dietary goals.;Weight Management/Obesity: Establish reasonable short term and long term weight goals.    Admit Weight 181 lb (82.1 kg)    Goal Weight: Short Term 176 lb (79.8 kg)    Goal Weight: Long Term 170 lb (77.1 kg)    Expected Outcomes Short Term: Continue to assess and modify interventions until short term weight is achieved;Long Term: Adherence to nutrition and physical activity/exercise program aimed toward attainment of established weight goal;Weight Loss: Understanding of general recommendations for a balanced deficit meal plan, which promotes  1-2 lb weight loss per week and includes a negative energy balance of [phone] kcal/d;Understanding recommendations for meals to include 15-35% energy as protein, 25-35% energy from fat, 35-60% energy from carbohydrates, less than 220m of dietary cholesterol, 20-35 gm of total fiber daily;Understanding of distribution of calorie intake throughout the day with the consumption of 4-5 meals/snacks    Diabetes Yes   Diet Controlled   Hypertension Yes    Intervention Provide education on lifestyle modifcations including regular physical activity/exercise, weight management, moderate sodium restriction and increased consumption of fresh fruit, vegetables, and low fat dairy, alcohol moderation, and smoking cessation.;Monitor prescription use compliance.    Expected Outcomes Short Term: Continued assessment and intervention until BP is < 140/975mHG in hypertensive participants. < 130/8036mG in hypertensive participants with diabetes, heart failure or chronic kidney disease.;Long Term: Maintenance of blood pressure at goal levels.    Lipids Yes    Intervention Provide education and support for participant on nutrition & aerobic/resistive exercise along with prescribed medications to achieve LDL <14m27mDL >40mg57m Expected Outcomes Short Term: Participant states understanding of desired cholesterol values and is compliant with medications prescribed. Participant is following exercise prescription and nutrition guidelines.;Long Term: Cholesterol controlled with medications as prescribed, with individualized exercise RX and with personalized nutrition plan. Value goals: LDL < 14mg,37m > 40 mg.             Education:Diabetes - Individual verbal and written instruction  to review signs/symptoms of diabetes, desired ranges of glucose level fasting, after meals and with exercise. Acknowledge that pre and post exercise glucose checks will be done for 3 sessions at entry of program.   Core Components/Risk  Factors/Patient Goals Review:    Core Components/Risk Factors/Patient Goals at Discharge (Final Review):    ITP Comments:  ITP Comments     Row Name 03/12/22 1344 03/25/22 1515 04/01/22 0941 04/10/22 0706     ITP Comments Initial telephone completed. Diagnosis can be found in [facility] 8/7. EP orientation scheduled for Monday 9/18 at 1:30 Completed 6MWT and gym orientation. Initial ITP created and sent for review to Dr. Emily Filbert, Medical Director. First full day of exercise!  Patient was oriented to gym and equipment including functions, settings, policies, and procedures.  Patient's individual exercise prescription and treatment plan were reviewed.  All starting workloads were established based on the results of the 6 minute walk test done at initial orientation visit.  The plan for exercise progression was also introduced and progression will be customized based on patient's performance and goals. 30 Day review completed. Medical Director ITP review done, changes made as directed, and signed approval by Medical Director.    New to program             Comments:

## 2022-04-12 ENCOUNTER — Encounter: Payer: Medicare Other | Admitting: *Deleted

## 2022-04-12 DIAGNOSIS — Z951 Presence of aortocoronary bypass graft: Secondary | ICD-10-CM

## 2022-04-12 DIAGNOSIS — Z48812 Encounter for surgical aftercare following surgery on the circulatory system: Secondary | ICD-10-CM | POA: Diagnosis not present

## 2022-04-12 NOTE — Progress Notes (Signed)
Daily Session Note  Patient Details  Name: Teresa Marquez MRN: [id number] Date of Birth: Dec 26, 1940 Referring Provider:   Flowsheet Row Cardiac Rehab from 03/25/2022 in [facility] Cardiac and Pulmonary Rehab  Referring Provider Burt Knack       Encounter Date: 04/12/2022  Check In:  Session Check In - 04/12/22 1004       Check-In   Supervising physician immediately available to respond to emergencies See telemetry face sheet for immediately available ER MD    Location [facility]-Cardiac & Pulmonary Rehab    Staff Present Heath Lark, RN, BSN, CCRP;Jessica [place], MA, RCEP, CCRP, CCET;Joseph [place], [place]    Virtual Visit No    Medication changes reported     No    Fall or balance concerns reported    No    Warm-up and Cool-down Performed on first and last piece of equipment    Resistance Training Performed Yes    VAD Patient? No    PAD/SET Patient? No      Pain Assessment   Currently in Pain? No/denies                Social History   Tobacco Use  Smoking Status Never  Smokeless Tobacco Never    Goals Met:  Independence with exercise equipment Exercise tolerated well No report of concerns or symptoms today  Goals Unmet:  Not Applicable  Comments: Pt able to follow exercise prescription today without complaint.  Will continue to monitor for progression.    Dr. Emily Filbert is Medical Director for [place].  Dr. Ottie Glazier is Medical Director for [facility] Pulmonary Rehabilitation.

## 2022-04-15 ENCOUNTER — Encounter: Payer: Medicare Other | Admitting: *Deleted

## 2022-04-15 DIAGNOSIS — Z951 Presence of aortocoronary bypass graft: Secondary | ICD-10-CM

## 2022-04-15 DIAGNOSIS — Z48812 Encounter for surgical aftercare following surgery on the circulatory system: Secondary | ICD-10-CM | POA: Diagnosis not present

## 2022-04-15 NOTE — Progress Notes (Signed)
Daily Session Note  Patient Details  Name: Teresa Marquez MRN: [id number] Date of Birth: 19-Oct-1940 Referring Provider:   Flowsheet Row Cardiac Rehab from 03/25/2022 in [facility] Cardiac and Pulmonary Rehab  Referring Provider Burt Knack       Encounter Date: 04/15/2022  Check In:  Session Check In - 04/15/22 0932       Check-In   Supervising physician immediately available to respond to emergencies See telemetry face sheet for immediately available ER MD    Location [facility]-Cardiac & Pulmonary Rehab    Staff Present Earlean Shawl, BS, ACSM CEP, Exercise Physiologist;Noah Tickle, BS, Exercise Physiologist;Darline Faith Tamala Julian, RN, ADN    Virtual Visit No    Medication changes reported     No    Fall or balance concerns reported    No    Warm-up and Cool-down Performed on first and last piece of equipment    Resistance Training Performed Yes    VAD Patient? No    PAD/SET Patient? No      Pain Assessment   Currently in Pain? No/denies                Social History   Tobacco Use  Smoking Status Never  Smokeless Tobacco Never    Goals Met:  Independence with exercise equipment Exercise tolerated well Personal goals reviewed No report of concerns or symptoms today Strength training completed today  Goals Unmet:  Not Applicable  Comments: Pt able to follow exercise prescription today without complaint.  Will continue to monitor for progression.    Dr. Emily Filbert is Medical Director for [place].  Dr. Ottie Glazier is Medical Director for [facility] Pulmonary Rehabilitation.

## 2022-04-17 ENCOUNTER — Encounter: Payer: Medicare Other | Admitting: *Deleted

## 2022-04-17 DIAGNOSIS — Z951 Presence of aortocoronary bypass graft: Secondary | ICD-10-CM | POA: Diagnosis not present

## 2022-04-17 DIAGNOSIS — Z48812 Encounter for surgical aftercare following surgery on the circulatory system: Secondary | ICD-10-CM | POA: Diagnosis not present

## 2022-04-17 NOTE — Progress Notes (Signed)
Daily Session Note  Patient Details  Name: Teresa Marquez MRN: [id number] Date of Birth: 03-06-1941 Referring Provider:   Flowsheet Row Cardiac Rehab from 03/25/2022 in [facility] Cardiac and Pulmonary Rehab  Referring Provider Burt Knack       Encounter Date: 04/17/2022  Check In:  Session Check In - 04/17/22 0942       Check-In   Supervising physician immediately available to respond to emergencies See telemetry face sheet for immediately available ER MD    Location [facility]-Cardiac & Pulmonary Rehab    Staff Present Antionette Fairy, BS, Exercise Physiologist;Zykiria Bruening Tamala Julian, RN, Terie Purser, RCP,RRT,BSRT    Virtual Visit No    Medication changes reported     No    Fall or balance concerns reported    No    Warm-up and Cool-down Performed on first and last piece of equipment    Resistance Training Performed Yes    VAD Patient? No    PAD/SET Patient? No      Pain Assessment   Currently in Pain? No/denies                Social History   Tobacco Use  Smoking Status Never  Smokeless Tobacco Never    Goals Met:  Independence with exercise equipment Exercise tolerated well No report of concerns or symptoms today Strength training completed today  Goals Unmet:  Not Applicable  Comments: Pt able to follow exercise prescription today without complaint.  Will continue to monitor for progression.    Dr. Emily Filbert is Medical Director for [place].  Dr. Ottie Glazier is Medical Director for [facility] Pulmonary Rehabilitation.

## 2022-04-19 ENCOUNTER — Encounter: Payer: Medicare Other | Admitting: *Deleted

## 2022-04-19 DIAGNOSIS — Z951 Presence of aortocoronary bypass graft: Secondary | ICD-10-CM | POA: Diagnosis not present

## 2022-04-19 DIAGNOSIS — Z48812 Encounter for surgical aftercare following surgery on the circulatory system: Secondary | ICD-10-CM | POA: Diagnosis not present

## 2022-04-19 NOTE — Progress Notes (Signed)
Daily Session Note  Patient Details  Name: Teresa Marquez MRN: [id number] Date of Birth: [birth year] Referring Provider:   Flowsheet Row Cardiac Rehab from 03/25/2022 in [facility] Cardiac and Pulmonary Rehab  Referring Provider Burt Knack       Encounter Date: 04/19/2022  Check In:      Social History   Tobacco Use  Smoking Status Never  Smokeless Tobacco Never    Goals Met:  Independence with exercise equipment Exercise tolerated well No report of concerns or symptoms today  Goals Unmet:  Not Applicable  Comments: Pt able to follow exercise prescription today without complaint.  Will continue to monitor for progression.    Dr. Emily Filbert is Medical Director for [place].  Dr. Ottie Glazier is Medical Director for [facility] Pulmonary Rehabilitation.

## 2022-04-22 ENCOUNTER — Encounter: Payer: Medicare Other | Admitting: *Deleted

## 2022-04-22 DIAGNOSIS — Z48812 Encounter for surgical aftercare following surgery on the circulatory system: Secondary | ICD-10-CM | POA: Diagnosis not present

## 2022-04-22 DIAGNOSIS — Z951 Presence of aortocoronary bypass graft: Secondary | ICD-10-CM | POA: Diagnosis not present

## 2022-04-22 NOTE — Progress Notes (Signed)
Daily Session Note  Patient Details  Name: Teresa Marquez MRN: [id number] Date of Birth: [birth year] Referring Provider:   Flowsheet Row Cardiac Rehab from 03/25/2022 in [facility] Cardiac and Pulmonary Rehab  Referring Provider Burt Knack       Encounter Date: 04/22/2022  Check In:  Session Check In - 04/22/22 0936       Check-In   Supervising physician immediately available to respond to emergencies See telemetry face sheet for immediately available ER MD    Location [facility]-Cardiac & Pulmonary Rehab    Staff Present Antionette Fairy, BS, Exercise Physiologist;Kelly Amedeo Plenty, BS, ACSM CEP, Exercise Physiologist;Dequandre [place] Tamala Julian, RN, ADN    Virtual Visit No    Medication changes reported     No    Fall or balance concerns reported    No    Warm-up and Cool-down Performed on first and last piece of equipment    Resistance Training Performed Yes    VAD Patient? No    PAD/SET Patient? No      Pain Assessment   Currently in Pain? No/denies                Social History   Tobacco Use  Smoking Status Never  Smokeless Tobacco Never    Goals Met:  Independence with exercise equipment Exercise tolerated well No report of concerns or symptoms today Strength training completed today  Goals Unmet:  Not Applicable  Comments: Pt able to follow exercise prescription today without complaint.  Will continue to monitor for progression.    Dr. Emily Filbert is Medical Director for [place].  Dr. Ottie Glazier is Medical Director for [facility] Pulmonary Rehabilitation.

## 2022-04-24 ENCOUNTER — Encounter: Payer: Medicare Other | Admitting: *Deleted

## 2022-04-24 DIAGNOSIS — Z48812 Encounter for surgical aftercare following surgery on the circulatory system: Secondary | ICD-10-CM | POA: Diagnosis not present

## 2022-04-24 DIAGNOSIS — Z951 Presence of aortocoronary bypass graft: Secondary | ICD-10-CM | POA: Diagnosis not present

## 2022-04-24 NOTE — Progress Notes (Signed)
Daily Session Note  Patient Details  Name: Teresa Marquez MRN: [id number] Date of Birth: 1940-11-28 Referring Provider:   Flowsheet Row Cardiac Rehab from 03/25/2022 in [facility] Cardiac and Pulmonary Rehab  Referring Provider Burt Knack       Encounter Date: 04/24/2022  Check In:  Session Check In - 04/24/22 0951       Check-In   Supervising physician immediately available to respond to emergencies See telemetry face sheet for immediately available ER MD    Location [facility]-Cardiac & Pulmonary Rehab    Staff Present Antionette Fairy, BS, Exercise Physiologist;Jessica [place], MA, RCEP, CCRP, Mindi Curling, RN, [place]    Virtual Visit No    Medication changes reported     No    Fall or balance concerns reported    No    Warm-up and Cool-down Performed on first and last piece of equipment    Resistance Training Performed Yes    VAD Patient? No    PAD/SET Patient? No      Pain Assessment   Currently in Pain? No/denies                Social History   Tobacco Use  Smoking Status Never  Smokeless Tobacco Never    Goals Met:  Independence with exercise equipment Exercise tolerated well No report of concerns or symptoms today Strength training completed today  Goals Unmet:  Not Applicable  Comments: Pt able to follow exercise prescription today without complaint.  Will continue to monitor for progression.    Dr. Emily Filbert is Medical Director for [place].  Dr. Ottie Glazier is Medical Director for [facility] Pulmonary Rehabilitation.

## 2022-04-25 DIAGNOSIS — E119 Type 2 diabetes mellitus without complications: Secondary | ICD-10-CM | POA: Diagnosis not present

## 2022-04-25 DIAGNOSIS — H40023 Open angle with borderline findings, high risk, bilateral: Secondary | ICD-10-CM | POA: Diagnosis not present

## 2022-04-25 DIAGNOSIS — H0288B Meibomian gland dysfunction left eye, upper and lower eyelids: Secondary | ICD-10-CM | POA: Diagnosis not present

## 2022-04-25 DIAGNOSIS — H524 Presbyopia: Secondary | ICD-10-CM | POA: Diagnosis not present

## 2022-04-25 DIAGNOSIS — H5203 Hypermetropia, bilateral: Secondary | ICD-10-CM | POA: Diagnosis not present

## 2022-04-25 DIAGNOSIS — H04123 Dry eye syndrome of bilateral lacrimal glands: Secondary | ICD-10-CM | POA: Diagnosis not present

## 2022-04-25 DIAGNOSIS — H2513 Age-related nuclear cataract, bilateral: Secondary | ICD-10-CM | POA: Diagnosis not present

## 2022-04-26 ENCOUNTER — Encounter: Payer: Medicare Other | Admitting: *Deleted

## 2022-04-26 DIAGNOSIS — Z951 Presence of aortocoronary bypass graft: Secondary | ICD-10-CM

## 2022-04-26 DIAGNOSIS — Z48812 Encounter for surgical aftercare following surgery on the circulatory system: Secondary | ICD-10-CM | POA: Diagnosis not present

## 2022-04-26 NOTE — Progress Notes (Signed)
Daily Session Note  Patient Details  Name: Teresa Marquez MRN: [id number] Date of Birth: 06/07/1941 Referring Provider:   Flowsheet Row Cardiac Rehab from 03/25/2022 in [facility] Cardiac and Pulmonary Rehab  Referring Provider Burt Knack       Encounter Date: 04/26/2022  Check In:  Session Check In - 04/26/22 0925       Check-In   Supervising physician immediately available to respond to emergencies See telemetry face sheet for immediately available ER MD    Location [facility]-Cardiac & Pulmonary Rehab    Staff Present Heath Lark, RN, BSN, CCRP;Jessica [place], MA, RCEP, CCRP, CCET;Joseph [place], [place]    Virtual Visit No    Medication changes reported     No    Fall or balance concerns reported    No    Warm-up and Cool-down Performed on first and last piece of equipment    Resistance Training Performed Yes    VAD Patient? No    PAD/SET Patient? No      Pain Assessment   Currently in Pain? No/denies                Social History   Tobacco Use  Smoking Status Never  Smokeless Tobacco Never    Goals Met:  Independence with exercise equipment Exercise tolerated well No report of concerns or symptoms today  Goals Unmet:  Not Applicable  Comments: Pt able to follow exercise prescription today without complaint.  Will continue to monitor for progression.    Dr. Emily Filbert is Medical Director for [place].  Dr. Ottie Glazier is Medical Director for [facility] Pulmonary Rehabilitation.

## 2022-04-29 ENCOUNTER — Encounter: Payer: Medicare Other | Admitting: *Deleted

## 2022-04-29 DIAGNOSIS — Z951 Presence of aortocoronary bypass graft: Secondary | ICD-10-CM | POA: Diagnosis not present

## 2022-04-29 DIAGNOSIS — Z48812 Encounter for surgical aftercare following surgery on the circulatory system: Secondary | ICD-10-CM | POA: Diagnosis not present

## 2022-04-29 NOTE — Progress Notes (Signed)
Daily Session Note  Patient Details  Name: Teresa Marquez MRN: [id number] Date of Birth: Oct 07, 1940 Referring Provider:   Flowsheet Row Cardiac Rehab from 03/25/2022 in [facility] Cardiac and Pulmonary Rehab  Referring Provider Burt Knack       Encounter Date: 04/29/2022  Check In:  Session Check In - 04/29/22 0921       Check-In   Supervising physician immediately available to respond to emergencies See telemetry face sheet for immediately available ER MD    Location [facility]-Cardiac & Pulmonary Rehab    Staff Present Teresa Marquez, BS, Exercise Physiologist;Teresa Sorter Tamala Julian, RN, Teresa Marquez, BS, ACSM CEP, Exercise Physiologist    Virtual Visit No    Medication changes reported     No    Fall or balance concerns reported    No    Warm-up and Cool-down Performed on first and last piece of equipment    Resistance Training Performed Yes    VAD Patient? No    PAD/SET Patient? No      Pain Assessment   Currently in Pain? No/denies                Social History   Tobacco Use  Smoking Status Never  Smokeless Tobacco Never    Goals Met:  Independence with exercise equipment Exercise tolerated well No report of concerns or symptoms today Strength training completed today  Goals Unmet:  Not Applicable  Comments: Pt able to follow exercise prescription today without complaint.  Will continue to monitor for progression.    Dr. Emily Filbert is Medical Director for [name].  Dr. Ottie Glazier is Medical Director for [facility] Pulmonary Rehabilitation.

## 2022-05-01 ENCOUNTER — Encounter: Payer: Medicare Other | Admitting: *Deleted

## 2022-05-01 DIAGNOSIS — Z48812 Encounter for surgical aftercare following surgery on the circulatory system: Secondary | ICD-10-CM | POA: Diagnosis not present

## 2022-05-01 DIAGNOSIS — Z951 Presence of aortocoronary bypass graft: Secondary | ICD-10-CM | POA: Diagnosis not present

## 2022-05-01 NOTE — Progress Notes (Signed)
Daily Session Note  Patient Details  Name: Teresa Marquez MRN: [id number] Date of Birth: October 26, 1940 Referring Provider:   Flowsheet Row Cardiac Rehab from 03/25/2022 in [facility] Cardiac and Pulmonary Rehab  Referring Provider Burt Knack       Encounter Date: 05/01/2022  Check In:  Session Check In - 05/01/22 0958       Check-In   Supervising physician immediately available to respond to emergencies See telemetry face sheet for immediately available ER MD    Location [facility]-Cardiac & Pulmonary Rehab    Staff Present Antionette Fairy, BS, Exercise Physiologist;Joseph [place] Poles, RN, [place]    Virtual Visit No    Medication changes reported     No    Fall or balance concerns reported    No    Warm-up and Cool-down Performed on first and last piece of equipment    Resistance Training Performed Yes    VAD Patient? No    PAD/SET Patient? No      Pain Assessment   Currently in Pain? No/denies                Social History   Tobacco Use  Smoking Status Never  Smokeless Tobacco Never    Goals Met:  Independence with exercise equipment Exercise tolerated well No report of concerns or symptoms today Strength training completed today  Goals Unmet:  Not Applicable  Comments: Pt able to follow exercise prescription today without complaint.  Will continue to monitor for progression.    Dr. Emily Filbert is Medical Director for [place].  Dr. Ottie Glazier is Medical Director for [facility] Pulmonary Rehabilitation.

## 2022-05-03 ENCOUNTER — Encounter: Payer: Medicare Other | Admitting: *Deleted

## 2022-05-03 DIAGNOSIS — Z48812 Encounter for surgical aftercare following surgery on the circulatory system: Secondary | ICD-10-CM | POA: Diagnosis not present

## 2022-05-03 DIAGNOSIS — Z951 Presence of aortocoronary bypass graft: Secondary | ICD-10-CM

## 2022-05-03 NOTE — Progress Notes (Signed)
Daily Session Note  Patient Details  Name: Teresa Marquez MRN: [id number] Date of Birth: 04/18/1941 Referring Provider:   Flowsheet Row Cardiac Rehab from 03/25/2022 in [facility] Cardiac and Pulmonary Rehab  Referring Provider [place]       Encounter Date: 05/03/2022  Check In:  Session Check In - 05/03/22 1012       Check-In   Supervising physician immediately available to respond to emergencies See telemetry face sheet for immediately available ER MD    Location [facility]-Cardiac & Pulmonary Rehab    Staff Present Susanne Bice, RN, BSN, CCRP;Jessica [place], MA, RCEP, CCRP, CCET;Joseph [place], RCP,RRT,BSRT    Virtual Visit No    Medication changes reported     No    Fall or balance concerns reported    No    Warm-up and Cool-down Performed on first and last piece of equipment    Resistance Training Performed Yes    VAD Patient? No    PAD/SET Patient? No      Pain Assessment   Currently in Pain? No/denies                Social History   Tobacco Use  Smoking Status Never  Smokeless Tobacco Never    Goals Met:  Independence with exercise equipment Exercise tolerated well No report of concerns or symptoms today  Goals Unmet:  Not Applicable  Comments: Pt able to follow exercise prescription today without complaint.  Will continue to monitor for progression.    Dr. Mark Miller is Medical Director for [facility] Cardiac [facility].  Dr. Fuad Aleskerov is Medical Director for [facility] Pulmonary Rehabilitation. 

## 2022-05-06 ENCOUNTER — Encounter: Payer: Medicare Other | Admitting: *Deleted

## 2022-05-06 DIAGNOSIS — Z951 Presence of aortocoronary bypass graft: Secondary | ICD-10-CM

## 2022-05-06 DIAGNOSIS — Z48812 Encounter for surgical aftercare following surgery on the circulatory system: Secondary | ICD-10-CM | POA: Diagnosis not present

## 2022-05-06 NOTE — Progress Notes (Signed)
Daily Session Note  Patient Details  Name: Geovana M Budlong MRN: [id number] Date of Birth: 11/19/1940 Referring Provider:   Flowsheet Row Cardiac Rehab from 03/25/2022 in [facility] Cardiac and Pulmonary Rehab  Referring Provider [place]       Encounter Date: 05/06/2022  Check In:  Session Check In - 05/06/22 0935       Check-In   Supervising physician immediately available to respond to emergencies See telemetry face sheet for immediately available ER MD    Location [facility]-Cardiac & Pulmonary Rehab    Staff Present Kelly Hayes, BS, ACSM CEP, Exercise Physiologist;Noah Tickle, BS, Exercise Physiologist;Megan Smith, RN, ADN    Virtual Visit No    Medication changes reported     No    Fall or balance concerns reported    No    Warm-up and Cool-down Performed on first and last piece of equipment    Resistance Training Performed Yes    VAD Patient? No    PAD/SET Patient? No      Pain Assessment   Currently in Pain? No/denies                Social History   Tobacco Use  Smoking Status Never  Smokeless Tobacco Never    Goals Met:  Independence with exercise equipment Exercise tolerated well No report of concerns or symptoms today Strength training completed today  Goals Unmet:  Not Applicable  Comments: Pt able to follow exercise prescription today without complaint.  Will continue to monitor for progression.    Dr. Mark Miller is Medical Director for [facility].  Dr. Fuad Aleskerov is Medical Director for [facility] Pulmonary Rehabilitation. 

## 2022-05-06 NOTE — Progress Notes (Signed)
Completed initial RD consultation ?

## 2022-05-08 ENCOUNTER — Encounter: Payer: Self-pay | Admitting: *Deleted

## 2022-05-08 ENCOUNTER — Encounter: Payer: Medicare Other | Attending: Cardiovascular Disease | Admitting: *Deleted

## 2022-05-08 DIAGNOSIS — Z951 Presence of aortocoronary bypass graft: Secondary | ICD-10-CM | POA: Insufficient documentation

## 2022-05-08 DIAGNOSIS — Z48812 Encounter for surgical aftercare following surgery on the circulatory system: Secondary | ICD-10-CM | POA: Diagnosis not present

## 2022-05-08 NOTE — Progress Notes (Signed)
Cardiac Individual Treatment Plan  Patient Details  Name: Teresa Marquez MRN: [id number] Date of Birth: 1940/12/09 Referring Provider:   Flowsheet Row Cardiac Rehab from 03/25/2022 in [facility] Cardiac and Pulmonary Rehab  Referring Provider Burt Knack       Initial Encounter Date:  Flowsheet Row Cardiac Rehab from 03/25/2022 in [facility] Cardiac and Pulmonary Rehab  Date 03/25/22       Visit Diagnosis: S/P CABG x 1  Patient's Home Medications on Admission:  Current Outpatient Medications:    acetaminophen (TYLENOL) 500 MG tablet, Take 500 mg by mouth every 6 (six) hours as needed for moderate pain., Disp: , Rfl:    amiodarone (PACERONE) 200 MG tablet, Take 1 tablet (200 mg total) by mouth 2 (two) times daily. X 7 days, then decrease to 200 mg daily, Disp: 60 tablet, Rfl: 1   aspirin 81 MG chewable tablet, Chew 81 mg by mouth daily., Disp: , Rfl:    atorvastatin (LIPITOR) 80 MG tablet, Take 1 tablet (80 mg total) by mouth daily., Disp: 30 tablet, Rfl: 3   ezetimibe (ZETIA) 10 MG tablet, Take 10 mg by mouth daily., Disp: , Rfl:    fluticasone (FLONASE) 50 MCG/ACT nasal spray, Place 2 sprays into both nostrils every evening., Disp: , Rfl:    folic acid (FOLVITE) 841 MCG tablet, Take 400 mcg daily by mouth., Disp: , Rfl:    metoprolol tartrate (LOPRESSOR) 25 MG tablet, Take 0.5 tablets (12.5 mg total) by mouth 2 (two) times daily., Disp: 60 tablet, Rfl: 3   MISC NATURAL PRODUCTS PO, Take 1 Application by mouth daily. Milk Thistle, Disp: , Rfl:    Multiple Vitamins-Minerals (HAIR SKIN & NAILS ADVANCED PO), Take 1 tablet by mouth daily., Disp: , Rfl:    omeprazole (PRILOSEC) 20 MG capsule, Take 10 mg by mouth every evening., Disp: , Rfl:    Polyvinyl Alcohol-Povidone (REFRESH OP), Place 1 drop into both eyes daily., Disp: , Rfl:    Pseudoeph-Bromphen-DM (BROMFED DM PO), 5 ml as needed, Disp: , Rfl:    Turmeric 500 MG CAPS, Take 2 capsules by mouth daily., Disp: , Rfl:    vitamin B-12  (CYANOCOBALAMIN) 1000 MCG tablet, Take 1,000 mcg daily by mouth., Disp: , Rfl:   Past Medical History: Past Medical History:  Diagnosis Date   Abdominal pain    Anxiety    Basal cell carcinoma    DDD (degenerative disc disease), lumbar    Diabetes mellitus without complication ([place])    DM (diabetes mellitus) ([place])    Drug-induced myopathy    Elevated blood pressure reading in office without diagnosis of hypertension    GERD (gastroesophageal reflux disease)    Hyperlipidemia    Menopause    NAFLD (nonalcoholic fatty liver disease)    OSA (obstructive sleep apnea)     Tobacco Use: Social History   Tobacco Use  Smoking Status Never  Smokeless Tobacco Never    Labs: Review Flowsheet       Latest Ref Rng & Units 02/11/2022 02/12/2022 02/13/2022  Labs for ITP Cardiac and Pulmonary Rehab  Cholestrol 0 - 200 mg/dL - 174  -  LDL (calc) 0 - 99 mg/dL - 106  -  HDL-C >40 mg/dL - 36  -  Trlycerides <150 mg/dL - 160  -  Hemoglobin A1c 4.8 - 5.6 % - 7.0  -  PH, Arterial 7.35 - 7.45 - - 7.311  7.357  7.322  7.421  7.461  7.434  7.372   PCO2 arterial  32 - 48 mmHg - - 43.8  47.0  44.6  37.2  37.9  36.9  45.8   Bicarbonate 20.0 - 28.0 mmol/L - - 22.2  26.6  23.4  24.2  27.0  24.7  26.6   TCO2 22 - 32 mmol/L 24  - _0 Acid-base deficit 0.0 - 2.0 mmol/L - - 4.0  3.0   O2 Saturation % - - 95  99  95  95  100  100  100      Exercise Target Goals: Exercise Program Goal: Individual exercise prescription set using results from initial 6 min walk test and THRR while considering  patient's activity barriers and safety.   Exercise Prescription Goal: Initial exercise prescription builds to 30-45 minutes a day of aerobic activity, 2-3 days per week.  Home exercise guidelines will be given to patient during program as part of exercise prescription that the participant will acknowledge.   Education: Aerobic Exercise: - Group verbal and visual presentation  on the components of exercise prescription. Introduces F.I.T.T principle from ACSM for exercise prescriptions.  Reviews F.I.T.T. principles of aerobic exercise including progression. Written material given at graduation.   Education: Resistance Exercise: - Group verbal and visual presentation on the components of exercise prescription. Introduces F.I.T.T principle from ACSM for exercise prescriptions  Reviews F.I.T.T. principles of resistance exercise including progression. Written material given at graduation.    Education: Exercise & Equipment Safety: - Individual verbal instruction and demonstration of equipment use and safety with use of the equipment. Flowsheet Row Cardiac Rehab from 05/08/2022 in [facility] Cardiac and Pulmonary Rehab  Date 03/25/22  Educator NT  Instruction Review Code 1- Verbalizes Understanding       Education: Exercise Physiology & General Exercise Guidelines: - Group verbal and written instruction with models to review the exercise physiology of the cardiovascular system and associated critical values. Provides general exercise guidelines with specific guidelines to those with heart or lung disease.  Flowsheet Row Cardiac Rehab from 05/08/2022 in [facility] Cardiac and Pulmonary Rehab  Date 05/08/22  Educator [facility]  Instruction Review Code 1- Verbalizes Understanding       Education: Flexibility, Balance, Mind/Body Relaxation: - Group verbal and visual presentation with interactive activity on the components of exercise prescription. Introduces F.I.T.T principle from ACSM for exercise prescriptions. Reviews F.I.T.T. principles of flexibility and balance exercise training including progression. Also discusses the mind body connection.  Reviews various relaxation techniques to help reduce and manage stress (i.e. Deep breathing, progressive muscle relaxation, and visualization). Balance handout provided to take home. Written material given at graduation.   Activity Barriers &  Risk Stratification:  Activity Barriers & Cardiac Risk Stratification - 03/25/22 1526       Activity Barriers & Cardiac Risk Stratification   Activity Barriers None;Muscular Weakness;Deconditioning;Other (comment)    Comments Hip Fatigue/Pain    Cardiac Risk Stratification Moderate             6 Minute Walk:  6 Minute Walk     Row Name 03/25/22 1525         6 Minute Walk   Phase Initial     Distance 1130 feet     Walk Time 6 minutes     # of Rest Breaks 0     MPH 2.12     METS 1.74     RPE 11  Perceived Dyspnea  1     VO2 Peak 6.09     Symptoms Yes (comment)     Comments Hip tightness     Resting HR 58 bpm     Resting BP 122/62     Resting Oxygen Saturation  97 %     Exercise Oxygen Saturation  during 6 min walk 96 %     Max Ex. HR 91 bpm     Max Ex. BP 146/70     2 Minute Post BP 132/64              Oxygen Initial Assessment:   Oxygen Re-Evaluation:   Oxygen Discharge (Final Oxygen Re-Evaluation):   Initial Exercise Prescription:  Initial Exercise Prescription - 03/25/22 1500       Date of Initial Exercise RX and Referring Provider   Date 03/25/22    Referring Provider Burt Knack      Oxygen   Maintain Oxygen Saturation 88% or higher      Bike   Level 1    Watts 15    Minutes 15    METs 1.74      NuStep   Level 1    SPM 80    Minutes 15    METs 1.74      T5 Nustep   Level 1    SPM 80    Minutes 15    METs 1.74      Biostep-RELP   Level 1    SPM 50    Minutes 15    METs 1.74      Track   Laps 22    Minutes 15    METs 2.2      Prescription Details   Frequency (times per week) 3    Duration Progress to 30 minutes of continuous aerobic without signs/symptoms of physical distress      Intensity   THRR 40-80% of Max Heartrate 90-124    Ratings of Perceived Exertion 11-13    Perceived Dyspnea 0-4      Progression   Progression Continue to progress workloads to maintain intensity without signs/symptoms of physical  distress.      Resistance Training   Training Prescription Yes    Weight 2 lb    Reps 10-15             Perform Capillary Blood Glucose checks as needed.  Exercise Prescription Changes:   Exercise Prescription Changes     Row Name 03/25/22 1500 04/08/22 1100 04/10/22 1000 04/25/22 1600 05/07/22 1500     Response to Exercise   Blood Pressure (Admit) 122/62 122/60 -- 132/64 122/60   Blood Pressure (Exercise) 146/70 168/76 -- 164/78 --   Blood Pressure (Exit) 132/64 110/58 -- 124/54 126/70   Heart Rate (Admit) 58 bpm 66 bpm -- 63 bpm 63 bpm   Heart Rate (Exercise) 91 bpm 96 bpm -- 104 bpm 93 bpm   Heart Rate (Exit) 63 bpm 63 bpm -- 69 bpm 64 bpm   Oxygen Saturation (Admit) 97 % -- -- -- --   Oxygen Saturation (Exercise) 96 % -- -- -- --   Rating of Perceived Exertion (Exercise) 11 12 -- 14 15   Perceived Dyspnea (Exercise) 1 -- -- -- --   Symptoms Hip Tightness none -- none none   Comments 6MWT Results 3rd full day of exercise -- -- --   Duration -- Progress to 30 minutes of  aerobic without signs/symptoms of physical distress -- Continue with 30  min of aerobic exercise without signs/symptoms of physical distress. Continue with 30 min of aerobic exercise without signs/symptoms of physical distress.   Intensity -- THRR unchanged -- THRR unchanged THRR unchanged     Progression   Progression -- Continue to progress workloads to maintain intensity without signs/symptoms of physical distress. -- Continue to progress workloads to maintain intensity without signs/symptoms of physical distress. Continue to progress workloads to maintain intensity without signs/symptoms of physical distress.   Average METs -- 2.27 -- 2.71 2.86     Resistance Training   Training Prescription -- Yes -- Yes Yes   Weight -- 2 lb -- 2 lb 2 lb   Reps -- 10-15 -- 10-15 10-15     Interval Training   Interval Training -- No -- No No     Bike   Level -- -- -- 1 --   Watts -- -- -- 18 --   Minutes -- --  -- 15 --   METs -- -- -- 2.68 --     NuStep   Level -- 3 -- 3 4   Minutes -- 15 -- 15 15   METs -- -- -- 2.8 3.4     T5 Nustep   Level -- -- -- 3 --   Minutes -- -- -- 15 --   METs -- -- -- 2.1 --     Biostep-RELP   Level -- 3 -- 4 3   Minutes -- 15 -- 15 15   METs -- 3 -- 3 3     Track   Laps -- 26 -- 33 32   Minutes -- 15 -- 15 15   METs -- 2.41 -- 2.79 2.27     Home Exercise Plan   Plans to continue exercise at -- -- [employer] (comment)  [employer]; plans to use aerobic machines and walk [employer] (comment)  [employer]; plans to use aerobic machines and walk [employer] (comment)  [employer]; plans to use aerobic machines and walk   Frequency -- -- Add 2 additional days to program exercise sessions. Add 2 additional days to program exercise sessions. Add 2 additional days to program exercise sessions.   Initial Home Exercises Provided -- -- 04/10/22 04/10/22 04/10/22     Oxygen   Maintain Oxygen Saturation -- 88% or higher 88% or higher 88% or higher 88% or higher            Exercise Comments:   Exercise Comments     Row Name 04/01/22 0941           Exercise Comments First full day of exercise!  Patient was oriented to gym and equipment including functions, settings, policies, and procedures.  Patient's individual exercise prescription and treatment plan were reviewed.  All starting workloads were established based on the results of the 6 minute walk test done at initial orientation visit.  The plan for exercise progression was also introduced and progression will be customized based on patient's performance and goals.                Exercise Goals and Review:   Exercise Goals     Row Name 03/25/22 1541             Exercise Goals   Increase Physical Activity Yes       Intervention Provide advice, education, support and counseling about physical activity/exercise needs.;Develop an individualized exercise prescription for aerobic and resistive  training based on initial evaluation findings, risk stratification, comorbidities and  participant's personal goals.       Expected Outcomes Short Term: Attend rehab on a regular basis to increase amount of physical activity.;Long Term: Add in home exercise to make exercise part of routine and to increase amount of physical activity.;Long Term: Exercising regularly at least 3-5 days a week.       Increase Strength and Stamina Yes       Intervention Provide advice, education, support and counseling about physical activity/exercise needs.;Develop an individualized exercise prescription for aerobic and resistive training based on initial evaluation findings, risk stratification, comorbidities and participant's personal goals.       Expected Outcomes Short Term: Increase workloads from initial exercise prescription for resistance, speed, and METs.;Long Term: Improve cardiorespiratory fitness, muscular endurance and strength as measured by increased METs and functional capacity (6MWT);Short Term: Perform resistance training exercises routinely during rehab and add in resistance training at home       Able to understand and use rate of perceived exertion (RPE) scale Yes       Intervention Provide education and explanation on how to use RPE scale       Expected Outcomes Short Term: Able to use RPE daily in rehab to express subjective intensity level;Long Term:  Able to use RPE to guide intensity level when exercising independently       Able to understand and use Dyspnea scale Yes       Intervention Provide education and explanation on how to use Dyspnea scale       Expected Outcomes Short Term: Able to use Dyspnea scale daily in rehab to express subjective sense of shortness of breath during exertion;Long Term: Able to use Dyspnea scale to guide intensity level when exercising independently       Knowledge and understanding of Target Heart Rate Range (THRR) Yes       Intervention Provide education and  explanation of THRR including how the numbers were predicted and where they are located for reference       Expected Outcomes Short Term: Able to state/look up THRR;Short Term: Able to use daily as guideline for intensity in rehab;Long Term: Able to use THRR to govern intensity when exercising independently       Able to check pulse independently Yes       Intervention Provide education and demonstration on how to check pulse in carotid and radial arteries.;Review the importance of being able to check your own pulse for safety during independent exercise       Expected Outcomes Short Term: Able to explain why pulse checking is important during independent exercise;Long Term: Able to check pulse independently and accurately       Understanding of Exercise Prescription Yes       Intervention Provide education, explanation, and written materials on patient's individual exercise prescription       Expected Outcomes Long Term: Able to explain home exercise prescription to exercise independently;Short Term: Able to explain program exercise prescription                Exercise Goals Re-Evaluation :  Exercise Goals Re-Evaluation     Row Name 04/01/22 0941 04/08/22 1111 04/10/22 1023 04/24/22 0935 04/25/22 1650     Exercise Goal Re-Evaluation   Exercise Goals Review Increase Physical Activity;Increase Strength and Stamina;Able to understand and use rate of perceived exertion (RPE) scale;Able to understand and use Dyspnea scale;Knowledge and understanding of Target Heart Rate Range (THRR);Understanding of Exercise Prescription Increase Physical Activity;Increase Strength and Stamina;Understanding of Exercise  Prescription Increase Physical Activity;Increase Strength and Stamina;Understanding of Exercise Prescription Increase Physical Activity;Increase Strength and Stamina;Understanding of Exercise Prescription Increase Physical Activity;Increase Strength and Stamina;Understanding of Exercise Prescription    Comments Reviewed RPE and dyspnea scales, THR and program prescription with pt today.  Pt voiced understanding and was given a copy of goals to take home. Macklyn is doing well for the first couple sessions she has been here.  She was able to walk 26 laps on the track and has already increased her level on both the BS and T4 Nustep. We will continue to monitor as she progresses in the program. Reviewed home exercise with pt today.  Pt plans to go to the [facility] and use aerobic machines and walk for exercise.  Reviewed THR, pulse, RPE, sign and symptoms, pulse oximetery and when to call 911 or MD.  Also discussed weather considerations and indoor options.  Pt voiced understanding. Denica is doing well in rehab. She states seeing a few benefits from exercise, specifically she feels that she has more enery. She also states that because of her increase in energy she has been able to get more done around her home. She has had some trouble with her hip that has bothered her during exercise. She has been walking some at home but statest that it is not as much as she should. She plans to begin going to the [facility] for exercise on days away from the program. We will continue to monitor. Kariss continues to do well in rehab. She increased her overall average MET level to 2.71 METs. She also increased her walking to 30 laps on the track. She also improved to level 4 on the biostep. We will continue to monitor her progress in the program.   Expected Outcomes Short: Use RPE daily to regulate intensity. Long: Follow program prescription in THR. Short: Continue to increase laps on track Long: Continue to increase overall MET level Short: Walk on days away from rehab. Long: Continue to exercise independently. Short: Begin walking more at home. Long: Continue to exercise independently. Short: Begin walking more at home. Long: Continue to increase strength and stamina.    [place] Name 05/07/22 1514             Exercise Goal  Re-Evaluation   Exercise Goals Review Increase Physical Activity;Increase Strength and Stamina;Understanding of Exercise Prescription       Comments Norie continues to do well in rehab. She increased her overall average MET level to 2.86 METs. She is also consistently walking more than 30 laps on the track. She also improved to level 4 on the T4. We will continue to monitor her progress in the program.       Expected Outcomes Short: Continue to increase workloads and laps on the track. Long: Continue to increase strength and stamina.                Discharge Exercise Prescription (Final Exercise Prescription Changes):  Exercise Prescription Changes - 05/07/22 1500       Response to Exercise   Blood Pressure (Admit) 122/60    Blood Pressure (Exit) 126/70    Heart Rate (Admit) 63 bpm    Heart Rate (Exercise) 93 bpm    Heart Rate (Exit) 64 bpm    Rating of Perceived Exertion (Exercise) 15    Symptoms none    Duration Continue with 30 min of aerobic exercise without signs/symptoms of physical distress.    Intensity THRR unchanged  Progression   Progression Continue to progress workloads to maintain intensity without signs/symptoms of physical distress.    Average METs 2.86      Resistance Training   Training Prescription Yes    Weight 2 lb    Reps 10-15      Interval Training   Interval Training No      NuStep   Level 4    Minutes 15    METs 3.4      Biostep-RELP   Level 3    Minutes 15    METs 3      Track   Laps 32    Minutes 15    METs 2.27      Home Exercise Plan   Plans to continue exercise at [employer] (comment)   [employer]; plans to use aerobic machines and walk   Frequency Add 2 additional days to program exercise sessions.    Initial Home Exercises Provided 04/10/22      Oxygen   Maintain Oxygen Saturation 88% or higher             Nutrition:  Target Goals: Understanding of nutrition guidelines, daily intake of sodium <1511m,  cholesterol <2063m calories 30% from fat and 7% or less from saturated fats, daily to have 5 or more servings of fruits and vegetables.  Education: All About Nutrition: -Group instruction provided by verbal, written material, interactive activities, discussions, models, and posters to present general guidelines for heart healthy nutrition including fat, fiber, MyPlate, the role of sodium in heart healthy nutrition, utilization of the nutrition label, and utilization of this knowledge for meal planning. Follow up email sent as well. Written material given at graduation. [facility]  Date 04/03/22  [facility] Review Code 1- Verbalizes Understanding       Biometrics:  Pre Biometrics - 03/25/22 1541       Pre Biometrics   Height _0  (1.6 m)    Weight 181 lb (82.1 kg)    Waist Circumference 44.5 inches    Hip Circumference 43 inches    Waist to Hip Ratio 1.03 %    BMI (Calculated) 32.07    Single Leg Stand 30 seconds              Nutrition Therapy Plan and Nutrition Goals:  Nutrition Therapy & Goals - 05/06/22 1156       Nutrition Therapy   Diet Heart healthy, Low Na    Drug/Food Interactions Statins/Certain Fruits    Protein (specify units) 80-90g    Fiber 24 grams    Whole Grain Foods 3 servings    Saturated Fats 13 max. grams    Fruits and Vegetables 8 servings/day    Sodium 2 grams      Personal Nutrition Goals   Nutrition Goal ST: practice meal planning tips LT: increase fruit/vegetable intake to 5-8 servings per day, improve fruit/vegetable variety    Comments She enjoys fresh fruit  and does not snack, she limits red meat, limits starchy vegetables, she reports not eating out much (~1x week). She reports baking, but does not eat much of that. She feels that she does not eat enough vegetables. Drinks: water, black coffee (1-2 cups), coke zero (<12oz), lipton diet peach tea, she will  order tea or water when at a restaurant  B: oatmeal, cheerios, english muffin or toast (whole wheat/whole grain) with smart balance and some jelly, eggs most mornings (  1 full egg) L: whatever she has on hand such as 1/2 sandwich with pimento cheese, fruit with cheese and crackers S: sometimes dark chocolate if she does snack D: Meat (meatloaf, chicken, pork, ham) and vegetable and fruit like cooked apples. She reports using olive oil spray and butter in smaller amounts as well as canola oil with baking. She reports limiting salt with her cooking. She reports having a sweet tooth and she likes dark chocolate, fruit, and the occasional cookies. She reports she knows what she needs to do, but has a difficult time doing so during the week. Discussed heart healthy eating as well as meal planning which could make it easier to prepare foods during the week.      Intervention Plan   Intervention Prescribe, educate and counsel regarding individualized specific dietary modifications aiming towards targeted core components such as weight, hypertension, lipid management, diabetes, heart failure and other comorbidities.    Expected Outcomes Short Term Goal: Understand basic principles of dietary content, such as calories, fat, sodium, cholesterol and nutrients.;Short Term Goal: A plan has been developed with personal nutrition goals set during dietitian appointment.;Long Term Goal: Adherence to prescribed nutrition plan.             Nutrition Assessments:  MEDIFICTS Score Key: ?70 Need to make dietary changes  40-70 Heart Healthy Diet ? 40 Therapeutic Level Cholesterol Diet  Flowsheet Row Cardiac Rehab from 03/25/2022 in [facility] Cardiac and Pulmonary Rehab  Picture Your Plate Total Score on Admission 66      Picture Your Plate Scores: <29 Unhealthy dietary pattern with much room for improvement. 41-50 Dietary pattern unlikely to meet recommendations for good health and room for improvement. 51-60 More  healthful dietary pattern, with some room for improvement.  >60 Healthy dietary pattern, although there may be some specific behaviors that could be improved.    Nutrition Goals Re-Evaluation:   Nutrition Goals Discharge (Final Nutrition Goals Re-Evaluation):   Psychosocial: Target Goals: Acknowledge presence or absence of significant depression and/or stress, maximize coping skills, provide positive support system. Participant is able to verbalize types and ability to use techniques and skills needed for reducing stress and depression.   Education: Stress, Anxiety, and Depression - Group verbal and visual presentation to define topics covered.  Reviews how body is impacted by stress, anxiety, and depression.  Also discusses healthy ways to reduce stress and to treat/manage anxiety and depression.  Written material given at graduation. Flowsheet Row Cardiac Rehab from 05/08/2022 in [place] [facility]  Date 05/01/22  Educator [facility]  Instruction Review Code 1- [employer] Understanding       Education: Sleep Hygiene -Provides group verbal and written instruction about how sleep can affect your health.  Define sleep hygiene, discuss sleep cycles and impact of sleep habits. Review good sleep hygiene tips.    Initial Review & Psychosocial Screening:  Initial Psych Review & Screening - 03/12/22 1339       Initial Review   Current issues with Current Stress Concerns    Comments Recent stress over selling property, issues in church, family visiting      [place]? Yes   friends, 2 sons     Barriers   Psychosocial barriers to participate in program There are no identifiable barriers or psychosocial needs.;The patient should benefit from training in stress management and relaxation.      Screening Interventions   Interventions Encouraged to exercise;Provide feedback about the scores to participant;To  provide support and resources with identified  psychosocial needs    Expected Outcomes Short Term goal: Utilizing psychosocial counselor, staff and physician to assist with identification of specific Stressors or current issues interfering with healing process. Setting desired goal for each stressor or current issue identified.;Long Term Goal: Stressors or current issues are controlled or eliminated.;Long Term goal: The participant improves quality of Life and PHQ9 Scores as seen by post scores and/or verbalization of changes;Short Term goal: Identification and review with participant of any Quality of Life or Depression concerns found by scoring the questionnaire.             Quality of Life Scores:   Quality of Life - 03/25/22 1518       Quality of Life   Select Quality of Life      Quality of Life Scores   Health/Function Pre 22.29 %    Socioeconomic Pre 30 %    Psych/Spiritual Pre 27.43 %    Family Pre 28.5 %    GLOBAL Pre 25.6 %            Scores of 19 and below usually indicate a poorer quality of life in these areas.  A difference of  2-3 points is a clinically meaningful difference.  A difference of 2-3 points in the total score of the Quality of Life Index has been associated with significant improvement in overall quality of life, self-image, physical symptoms, and general health in studies assessing change in quality of life.  PHQ-9: Review Flowsheet       03/25/2022 05/20/2017  Depression screen PHQ 2/9  Decreased Interest 1 0  Down, Depressed, Hopeless 1 0  PHQ - 2 Score 2 0  Altered sleeping 1 -  Tired, decreased energy 1 -  Change in appetite 0 -  Feeling bad or failure about yourself  0 -  Trouble concentrating 1 -  Moving slowly or fidgety/restless 0 -  Suicidal thoughts 0 -  PHQ-9 Score 5 -  Difficult doing work/chores Not difficult at all -   Interpretation of Total Score  Total Score Depression Severity:  1-4 = Minimal depression, 5-9 = Mild depression, 10-14 = Moderate depression, 15-19 =  Moderately severe depression, 20-27 = Severe depression   Psychosocial Evaluation and Intervention:  Psychosocial Evaluation - 03/12/22 1353       Psychosocial Evaluation & Interventions   Interventions Encouraged to exercise with the program and follow exercise prescription;Stress management education;Relaxation education    Comments Ms. 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 She has been reducing her help throughout the day and this week does not have anyone coming to help her. She is doing more around the house and feels comfortable with her progress. She hasn't driven yet, but possibly might this weekend. She reports a lot of stress leading up to her surgery, which included trying to sell a property that was very difficult with the lawyers and families involved. She also states her church has had a lot of issues recently and she had been studying to lead a Bible Study. These events overshadowed a visit from family that she was looking forward to. All in all, she states a lot was happening at the time of the event, so she is ready to ease her way back into her usual routine and work on boosting her stamina. She used to work out at [employer] consistently and wishes to return to that routine    Expected  Outcomes Short: attend cardiac rehab for education and exercise. Long: develop and maintain positive self care habits.    Continue Psychosocial Services  Follow up required by staff             Psychosocial Re-Evaluation:  Psychosocial Re-Evaluation     [place] Name 04/15/22 0955             Psychosocial Re-Evaluation   Current issues with Current Stress Concerns       Comments Marita reports no new stress concerns. She does say that she sleeps about 5 hours a night, but does feel rested. Patient reports that she has a good support system.       Expected Outcomes Short: continue to attend rehab and exercise for mental health benefits. Long: maintin good mental  health habits.       Interventions Encouraged to attend Cardiac Rehabilitation for the exercise       Continue Psychosocial Services  Follow up required by staff                Psychosocial Discharge (Final Psychosocial Re-Evaluation):  Psychosocial Re-Evaluation - 04/15/22 0955       Psychosocial Re-Evaluation   Current issues with Current Stress Concerns    Comments Kenidi reports no new stress concerns. She does say that she sleeps about 5 hours a night, but does feel rested. Patient reports that she has a good support system.    Expected Outcomes Short: continue to attend rehab and exercise for mental health benefits. Long: maintin good mental health habits.    Interventions Encouraged to attend Cardiac Rehabilitation for the exercise    Continue Psychosocial Services  Follow up required by staff             Vocational Rehabilitation: Provide vocational rehab assistance to qualifying candidates.   Vocational Rehab Evaluation & Intervention:  Vocational Rehab - 03/12/22 1338       Initial Vocational Rehab Evaluation & Intervention   Assessment shows need for Vocational Rehabilitation No             Education: Education Goals: Education classes will be provided on a variety of topics geared toward better understanding of heart health and risk factor modification. Participant will state understanding/return demonstration of topics presented as noted by education test scores.  Learning Barriers/Preferences:  Learning Barriers/Preferences - 03/12/22 1338       Learning Barriers/Preferences   Learning Barriers None    Learning Preferences Individual Instruction             General Cardiac Education Topics:  AED/CPR: - Group verbal and written instruction with the use of models to demonstrate the basic use of the AED with the basic ABC's of resuscitation.   Anatomy and Cardiac Procedures: - Group verbal and visual presentation and models provide  information about basic cardiac anatomy and function. Reviews the testing methods done to diagnose heart disease and the outcomes of the test results. Describes the treatment choices: Medical Management, Angioplasty, or Coronary Bypass Surgery for treating various heart conditions including Myocardial Infarction, Angina, Valve Disease, and Cardiac Arrhythmias.  Written material given at graduation.   Medication Safety: - Group verbal and visual instruction to review commonly prescribed medications for heart and lung disease. Reviews the medication, class of the drug, and side effects. Includes the steps to properly store meds and maintain the prescription regimen.  Written material given at graduation. Flowsheet Row Cardiac Rehab from 05/08/2022 in [facility] Cardiac and Pulmonary Rehab  Date  04/10/22  Educator SB  Instruction Review Code 1- Verbalizes Understanding       Intimacy: - Group verbal instruction through game format to discuss how heart and lung disease can affect sexual intimacy. Written material given at graduation..   Know Your Numbers and Heart Failure: - Group verbal and visual instruction to discuss disease risk factors for cardiac and pulmonary disease and treatment options.  Reviews associated critical values for Overweight/Obesity, Hypertension, Cholesterol, and Diabetes.  Discusses basics of heart failure: signs/symptoms and treatments.  Introduces Heart Failure Zone chart for action plan for heart failure.  Written material given at graduation. Flowsheet Row Cardiac Rehab from 05/08/2022 in [facility] Cardiac and Pulmonary Rehab  Date 04/17/22  Educator SB  Instruction Review Code 1- Verbalizes Understanding       Infection Prevention: - Provides verbal and written material to individual with discussion of infection control including proper hand washing and proper equipment cleaning during exercise session. Flowsheet Row Cardiac Rehab from 05/08/2022 in [facility] Cardiac and Pulmonary  Rehab  Date 03/25/22  Educator NT  Instruction Review Code 1- Verbalizes Understanding       Falls Prevention: - Provides verbal and written material to individual with discussion of falls prevention and safety. Flowsheet Row Cardiac Rehab from 05/08/2022 in [facility] Cardiac and Pulmonary Rehab  Date 03/25/22  Educator NT  Instruction Review Code 1- Verbalizes Understanding       Other: -Provides group and verbal instruction on various topics (see comments)   Knowledge Questionnaire Score:  Knowledge Questionnaire Score - 03/25/22 1518       Knowledge Questionnaire Score   Pre Score 26/26             Core Components/Risk Factors/Patient Goals at Admission:  Personal Goals and Risk Factors at Admission - 03/25/22 1523       Core Components/Risk Factors/Patient Goals on Admission    Weight Management Yes;Weight Loss    Intervention Weight Management: Develop a combined nutrition and exercise program designed to reach desired caloric intake, while maintaining appropriate intake of nutrient and fiber, sodium and fats, and appropriate energy expenditure required for the weight goal.;Weight Management: Provide education and appropriate resources to help participant work on and attain dietary goals.;Weight Management/Obesity: Establish reasonable short term and long term weight goals.    Admit Weight 181 lb (82.1 kg)    Goal Weight: Short Term 176 lb (79.8 kg)    Goal Weight: Long Term 170 lb (77.1 kg)    Expected Outcomes Short Term: Continue to assess and modify interventions until short term weight is achieved;Long Term: Adherence to nutrition and physical activity/exercise program aimed toward attainment of established weight goal;Weight Loss: Understanding of general recommendations for a balanced deficit meal plan, which promotes 1-2 lb weight loss per week and includes a negative energy balance of [phone] kcal/d;Understanding recommendations for meals to include 15-35% energy  as protein, 25-35% energy from fat, 35-60% energy from carbohydrates, less than 244m of dietary cholesterol, 20-35 gm of total fiber daily;Understanding of distribution of calorie intake throughout the day with the consumption of 4-5 meals/snacks    Diabetes Yes   Diet Controlled   Hypertension Yes    Intervention Provide education on lifestyle modifcations including regular physical activity/exercise, weight management, moderate sodium restriction and increased consumption of fresh fruit, vegetables, and low fat dairy, alcohol moderation, and smoking cessation.;Monitor prescription use compliance.    Expected Outcomes Short Term: Continued assessment and intervention until BP is < 140/981mHG in hypertensive participants. <  130/6m HG in hypertensive participants with diabetes, heart failure or chronic kidney disease.;Long Term: Maintenance of blood pressure at goal levels.    Lipids Yes    Intervention Provide education and support for participant on nutrition & aerobic/resistive exercise along with prescribed medications to achieve LDL <725m HDL >4037m   Expected Outcomes Short Term: Participant states understanding of desired cholesterol values and is compliant with medications prescribed. Participant is following exercise prescription and nutrition guidelines.;Long Term: Cholesterol controlled with medications as prescribed, with individualized exercise RX and with personalized nutrition plan. Value goals: LDL < 93m29mDL > 40 mg.             Education:Diabetes - Individual verbal and written instruction to review signs/symptoms of diabetes, desired ranges of glucose level fasting, after meals and with exercise. Acknowledge that pre and post exercise glucose checks will be done for 3 sessions at entry of program.   Core Components/Risk Factors/Patient Goals Review:   Goals and Risk Factor Review     Row Name 04/15/22 0947             Core Components/Risk Factors/Patient Goals  Review   Personal Goals Review Weight Management/Obesity;Diabetes;Hypertension;Lipids       Review CharRaygenorts that her weight has been steady. She does take all medications as prescribed. She is not on any diabetes medications and does not check her blood sugar at home. She does follow up with her doctor and monitor to monitor her blood sugars. CharStoris have a blood pressure cuff at home but does not check it regularly since she comes to rehab and gets it checked often.       Expected Outcomes Short: continue to attend rehab with regular attendance. Long: continue to work on weight loss goal.                Core Components/Risk Factors/Patient Goals at Discharge (Final Review):   Goals and Risk Factor Review - 04/15/22 0947       Core Components/Risk Factors/Patient Goals Review   Personal Goals Review Weight Management/Obesity;Diabetes;Hypertension;Lipids    Review CharJanyceorts that her weight has been steady. She does take all medications as prescribed. She is not on any diabetes medications and does not check her blood sugar at home. She does follow up with her doctor and monitor to monitor her blood sugars. CharClydenes have a blood pressure cuff at home but does not check it regularly since she comes to rehab and gets it checked often.    Expected Outcomes Short: continue to attend rehab with regular attendance. Long: continue to work on weight loss goal.             ITP Comments:  ITP Comments     Row Name 03/12/22 1344 03/25/22 1515 04/01/22 0941 04/10/22 0706 05/06/22 1145   ITP Comments Initial telephone completed. Diagnosis can be found in [facility]. EP orientation scheduled for Monday 9/18 at 1:30 Completed 6MWT and gym orientation. Initial ITP created and sent for review to Dr. MarkEmily Filbertdical Director. First full day of exercise!  Patient was oriented to gym and equipment including functions, settings, policies, and procedures.  Patient's individual  exercise prescription and treatment plan were reviewed.  All starting workloads were established based on the results of the 6 minute walk test done at initial orientation visit.  The plan for exercise progression was also introduced and progression will be customized based on patient's performance and goals. 30 Day review completed. Medical  Director ITP review done, changes made as directed, and signed approval by Market researcher.    New to program Completed initial RD consultation    [place] Name 05/08/22 0922           ITP Comments 30 Day review completed. Medical Director ITP review done, changes made as directed, and signed approval by Medical Director.                Comments:

## 2022-05-08 NOTE — Progress Notes (Signed)
Daily Session Note  Patient Details  Name: Teresa Marquez MRN: [id number] Date of Birth: 1941-05-15 Referring Provider:   Flowsheet Row Cardiac Rehab from 03/25/2022 in [facility] Cardiac and Pulmonary Rehab  Referring Provider Burt Knack       Encounter Date: 05/08/2022  Check In:  Session Check In - 05/08/22 0921       Check-In   Supervising physician immediately available to respond to emergencies See telemetry face sheet for immediately available ER MD    Location [facility]-Cardiac & Pulmonary Rehab    Staff Present Antionette Fairy, BS, Exercise Physiologist;Joseph Rosebud Poles, RN, [place]    Virtual Visit No    Medication changes reported     No    Fall or balance concerns reported    No    Warm-up and Cool-down Performed on first and last piece of equipment    Resistance Training Performed Yes    VAD Patient? No    PAD/SET Patient? No      Pain Assessment   Currently in Pain? No/denies                Social History   Tobacco Use  Smoking Status Never  Smokeless Tobacco Never    Goals Met:  Independence with exercise equipment Exercise tolerated well No report of concerns or symptoms today Strength training completed today  Goals Unmet:  Not Applicable  Comments: Pt able to follow exercise prescription today without complaint.  Will continue to monitor for progression.    Dr. Emily Filbert is Medical Director for [place].  Dr. Ottie Glazier is Medical Director for [facility] Pulmonary Rehabilitation.

## 2022-05-10 ENCOUNTER — Encounter: Payer: Medicare Other | Admitting: *Deleted

## 2022-05-10 ENCOUNTER — Telehealth: Payer: Self-pay

## 2022-05-10 DIAGNOSIS — Z951 Presence of aortocoronary bypass graft: Secondary | ICD-10-CM | POA: Diagnosis not present

## 2022-05-10 DIAGNOSIS — Z48812 Encounter for surgical aftercare following surgery on the circulatory system: Secondary | ICD-10-CM | POA: Diagnosis not present

## 2022-05-10 NOTE — Telephone Encounter (Signed)
   Patient Name: Teresa Marquez  DOB: 1941-01-30 MRN: [id number]  Primary Cardiologist: Freada Bergeron, MD  Chart reviewed as part of pre-operative protocol coverage.   Simple dental extractions (i.e. 1-2 teeth), cleanings are considered low risk procedures per guidelines and generally do not require any specific cardiac clearance. It is also generally accepted that for simple extractions and dental cleanings, there is no need to interrupt blood thinner therapy.  SBE prophylaxis is not required for the patient from a cardiac standpoint.  I will route this recommendation to the requesting party via Epic fax function and remove from pre-op pool.  Please call with questions.  Lenna Sciara, NP 05/10/2022, 12:27 PM

## 2022-05-10 NOTE — Progress Notes (Signed)
Daily Session Note  Patient Details  Name: Teresa Marquez MRN: [id number] Date of Birth: 04-Jun-1941 Referring Provider:   Flowsheet Row Cardiac Rehab from 03/25/2022 in [facility] Cardiac and Pulmonary Rehab  Referring Provider Burt Knack       Encounter Date: 05/10/2022  Check In:  Session Check In - 05/10/22 0951       Check-In   Supervising physician immediately available to respond to emergencies See telemetry face sheet for immediately available ER MD    Location [facility]-Cardiac & Pulmonary Rehab    Staff Present Heath Lark, RN, BSN, CCRP;Jessica Gideon, MA, RCEP, CCRP, CCET;Joseph [place], [place]    Virtual Visit No    Medication changes reported     No    Fall or balance concerns reported    No    Warm-up and Cool-down Performed on first and last piece of equipment    Resistance Training Performed Yes    VAD Patient? No    PAD/SET Patient? No      Pain Assessment   Currently in Pain? No/denies                Social History   Tobacco Use  Smoking Status Never  Smokeless Tobacco Never    Goals Met:  Independence with exercise equipment Exercise tolerated well No report of concerns or symptoms today  Goals Unmet:  Not Applicable  Comments: Pt able to follow exercise prescription today without complaint.  Will continue to monitor for progression.    Dr. Emily Filbert is Medical Director for [place].  Dr. Ottie Glazier is Medical Director for [facility] Pulmonary Rehabilitation.

## 2022-05-10 NOTE — Telephone Encounter (Signed)
..     Pre-operative Risk Assessment    Patient Name: Teresa Marquez  DOB: 27-Nov-1940 MRN: [id number]      Request for Surgical Clearance    Procedure:   dental cleaning / no extractions  Date of Surgery:  Clearance TBD                                 Surgeon:  dr Payton Spark Surgeon's Group or Practice Name:  [facility] Phone number:  [phone] Fax number:  [phone]   Type of Clearance Requested:   - Medical  - Pharmacy:  Hold Aspirin     Type of Anesthesia:  None    Additional requests/questions:    Gwenlyn Found   05/10/2022, 11:12 AM

## 2022-05-13 ENCOUNTER — Encounter: Payer: Medicare Other | Admitting: *Deleted

## 2022-05-13 DIAGNOSIS — Z48812 Encounter for surgical aftercare following surgery on the circulatory system: Secondary | ICD-10-CM | POA: Diagnosis not present

## 2022-05-13 DIAGNOSIS — Z951 Presence of aortocoronary bypass graft: Secondary | ICD-10-CM

## 2022-05-13 NOTE — Progress Notes (Signed)
Daily Session Note  Patient Details  Name: ALLISSA ALBRIGHT MRN: [id number] Date of Birth: 1941/04/15 Referring Provider:   Flowsheet Row Cardiac Rehab from 03/25/2022 in [facility] Cardiac and Pulmonary Rehab  Referring Provider Burt Knack       Encounter Date: 05/13/2022  Check In:  Session Check In - 05/13/22 0929       Check-In   Supervising physician immediately available to respond to emergencies See telemetry face sheet for immediately available ER MD    Location [facility]-Cardiac & Pulmonary Rehab    Staff Present Earlean Shawl, BS, ACSM CEP, Exercise Physiologist;Noah Tickle, BS, Exercise Physiologist;Danielys Madry Tamala Julian, RN, ADN    Virtual Visit No    Medication changes reported     No    Fall or balance concerns reported    No    Warm-up and Cool-down Performed on first and last piece of equipment    Resistance Training Performed Yes    VAD Patient? No    PAD/SET Patient? No      Pain Assessment   Currently in Pain? No/denies                Social History   Tobacco Use  Smoking Status Never  Smokeless Tobacco Never    Goals Met:  Independence with exercise equipment Exercise tolerated well No report of concerns or symptoms today Strength training completed today  Goals Unmet:  Not Applicable  Comments: Pt able to follow exercise prescription today without complaint.  Will continue to monitor for progression.    Dr. Emily Filbert is Medical Director for [place].  Dr. Ottie Glazier is Medical Director for [facility] Rehabilitation.

## 2022-05-15 ENCOUNTER — Encounter: Payer: Medicare Other | Admitting: *Deleted

## 2022-05-15 DIAGNOSIS — Z951 Presence of aortocoronary bypass graft: Secondary | ICD-10-CM | POA: Diagnosis not present

## 2022-05-15 DIAGNOSIS — Z48812 Encounter for surgical aftercare following surgery on the circulatory system: Secondary | ICD-10-CM | POA: Diagnosis not present

## 2022-05-15 NOTE — Progress Notes (Signed)
Daily Session Note  Patient Details  Name: Teresa Marquez MRN: [id number] Date of Birth: 10/06/1940 Referring Provider:   Flowsheet Row Cardiac Rehab from 03/25/2022 in [facility] Cardiac and Pulmonary Rehab  Referring Provider [place]       Encounter Date: 05/15/2022  Check In:  Session Check In - 05/15/22 0941       Check-In   Supervising physician immediately available to respond to emergencies See telemetry face sheet for immediately available ER MD    Location [facility]-Cardiac & Pulmonary Rehab    Staff Present Noah Tickle, BS, Exercise Physiologist;Megan Smith, RN, ADN;Joseph Hood, RCP,RRT,BSRT    Virtual Visit No    Medication changes reported     No    Fall or balance concerns reported    No    Warm-up and Cool-down Performed on first and last piece of equipment    Resistance Training Performed Yes    VAD Patient? No    PAD/SET Patient? No      Pain Assessment   Currently in Pain? No/denies                Social History   Tobacco Use  Smoking Status Never  Smokeless Tobacco Never    Goals Met:  Independence with exercise equipment Exercise tolerated well No report of concerns or symptoms today Strength training completed today  Goals Unmet:  Not Applicable  Comments: Pt able to follow exercise prescription today without complaint.  Will continue to monitor for progression.    Dr. Mark Miller is Medical Director for [facility].  Dr. Fuad Aleskerov is Medical Director for [facility] Pulmonary Rehabilitation. 

## 2022-05-17 ENCOUNTER — Encounter: Payer: Medicare Other | Admitting: *Deleted

## 2022-05-17 DIAGNOSIS — Z48812 Encounter for surgical aftercare following surgery on the circulatory system: Secondary | ICD-10-CM | POA: Diagnosis not present

## 2022-05-17 DIAGNOSIS — Z951 Presence of aortocoronary bypass graft: Secondary | ICD-10-CM

## 2022-05-17 NOTE — Progress Notes (Signed)
Daily Session Note  Patient Details  Name: Teresa Marquez MRN: [id number] Date of Birth: 31-Mar-1941 Referring Provider:   Flowsheet Row Cardiac Rehab from 03/25/2022 in [facility] Cardiac and Pulmonary Rehab  Referring Provider Burt Knack       Encounter Date: 05/17/2022  Check In:  Session Check In - 05/17/22 1007       Check-In   Supervising physician immediately available to respond to emergencies See telemetry face sheet for immediately available ER MD    Location [facility]-Cardiac & Pulmonary Rehab    Staff Present Heath Lark, RN, BSN, CCRP;Joseph [place], RCP,RRT,BSRT;Jessica [place], [place], [place], CCRP, CCET    Virtual Visit No    Medication changes reported     No    Fall or balance concerns reported    No    Warm-up and Cool-down Performed on first and last piece of equipment    Resistance Training Performed Yes    VAD Patient? No    PAD/SET Patient? No      Pain Assessment   Currently in Pain? No/denies                Social History   Tobacco Use  Smoking Status Never  Smokeless Tobacco Never    Goals Met:  Independence with exercise equipment Exercise tolerated well No report of concerns or symptoms today  Goals Unmet:  Not Applicable  Comments: Pt able to follow exercise prescription today without complaint.  Will continue to monitor for progression.    Dr. Emily Filbert is Medical Director for [place].  Dr. Ottie Glazier is Medical Director for [facility] Pulmonary Rehabilitation.

## 2022-05-20 ENCOUNTER — Encounter: Payer: Medicare Other | Admitting: *Deleted

## 2022-05-20 DIAGNOSIS — Z951 Presence of aortocoronary bypass graft: Secondary | ICD-10-CM

## 2022-05-20 DIAGNOSIS — Z48812 Encounter for surgical aftercare following surgery on the circulatory system: Secondary | ICD-10-CM | POA: Diagnosis not present

## 2022-05-20 NOTE — Progress Notes (Signed)
Daily Session Note  Patient Details  Name: Teresa Marquez MRN: [id number] Date of Birth: 1940/07/09 Referring Provider:   Flowsheet Row Cardiac Rehab from 03/25/2022 in [facility] Cardiac and Pulmonary Rehab  Referring Provider Burt Knack       Encounter Date: 05/20/2022  Check In:  Session Check In - 05/20/22 0940       Check-In   Supervising physician immediately available to respond to emergencies See telemetry face sheet for immediately available ER MD    Location [facility]-Cardiac & Pulmonary Rehab    Staff Present Earlean Shawl, BS, ACSM CEP, Exercise Physiologist;Noah Tickle, BS, Exercise Physiologist;Tryce Surratt Tamala Julian, RN, ADN    Virtual Visit No    Medication changes reported     No    Fall or balance concerns reported    No    Warm-up and Cool-down Performed on first and last piece of equipment    Resistance Training Performed Yes    VAD Patient? No    PAD/SET Patient? No      Pain Assessment   Currently in Pain? No/denies                Social History   Tobacco Use  Smoking Status Never  Smokeless Tobacco Never    Goals Met:  Independence with exercise equipment Exercise tolerated well No report of concerns or symptoms today Strength training completed today  Goals Unmet:  Not Applicable  Comments: Pt able to follow exercise prescription today without complaint.  Will continue to monitor for progression.    Dr. Emily Filbert is Medical Director for [place].  Dr. Ottie Glazier is Medical Director for [facility] Pulmonary Rehabilitation.

## 2022-05-22 VITALS — Ht 63.0 in | Wt 183.7 lb

## 2022-05-22 DIAGNOSIS — Z48812 Encounter for surgical aftercare following surgery on the circulatory system: Secondary | ICD-10-CM | POA: Diagnosis not present

## 2022-05-22 DIAGNOSIS — Z951 Presence of aortocoronary bypass graft: Secondary | ICD-10-CM

## 2022-05-22 NOTE — Patient Instructions (Signed)
Discharge Patient Instructions  Patient Details  Name: Teresa Marquez MRN: [id number] Date of Birth: [birth year] Referring Provider:  Merrilee Seashore, MD   Number of Visits: 75  Reason for Discharge:  Patient reached a stable level of exercise. Patient independent in their exercise. Patient has met program and personal goals.  Diagnosis:  S/P CABG x 1  Initial Exercise Prescription:  Initial Exercise Prescription - 03/25/22 1500       Date of Initial Exercise RX and Referring Provider   Date 03/25/22    Referring Provider Burt Knack      Oxygen   Maintain Oxygen Saturation 88% or higher      Bike   Level 1    Watts 15    Minutes 15    METs 1.74      NuStep   Level 1    SPM 80    Minutes 15    METs 1.74      T5 Nustep   Level 1    SPM 80    Minutes 15    METs 1.74      Biostep-RELP   Level 1    SPM 50    Minutes 15    METs 1.74      Track   Laps 22    Minutes 15    METs 2.2      Prescription Details   Frequency (times per week) 3    Duration Progress to 30 minutes of continuous aerobic without signs/symptoms of physical distress      Intensity   THRR 40-80% of Max Heartrate 90-124    Ratings of Perceived Exertion 11-13    Perceived Dyspnea 0-4      Progression   Progression Continue to progress workloads to maintain intensity without signs/symptoms of physical distress.      Resistance Training   Training Prescription Yes    Weight 2 lb    Reps 10-15             Discharge Exercise Prescription (Final Exercise Prescription Changes):  Exercise Prescription Changes - 05/07/22 1500       Response to Exercise   Blood Pressure (Admit) 122/60    Blood Pressure (Exit) 126/70    Heart Rate (Admit) 63 bpm    Heart Rate (Exercise) 93 bpm    Heart Rate (Exit) 64 bpm    Rating of Perceived Exertion (Exercise) 15    Symptoms none    Duration Continue with 30 min of aerobic exercise without signs/symptoms of physical distress.     Intensity THRR unchanged      Progression   Progression Continue to progress workloads to maintain intensity without signs/symptoms of physical distress.    Average METs 2.86      Resistance Training   Training Prescription Yes    Weight 2 lb    Reps 10-15      Interval Training   Interval Training No      NuStep   Level 4    Minutes 15    METs 3.4      Biostep-RELP   Level 3    Minutes 15    METs 3      Track   Laps 32    Minutes 15    METs 2.27      Home Exercise Plan   Plans to continue exercise at [employer] (comment)   [employer]; plans to use aerobic machines and walk   Frequency Add 2 additional days to program  exercise sessions.    Initial Home Exercises Provided 04/10/22      Oxygen   Maintain Oxygen Saturation 88% or higher             Functional Capacity:  6 Minute Walk     Row Name 03/25/22 1525 05/22/22 1254       6 Minute Walk   Phase Initial Discharge    Distance 1130 feet 1400 feet    Distance % Change -- 25 %    Distance Feet Change -- 270 ft    Walk Time 6 minutes 6 minutes    # of Rest Breaks 0 0    MPH 2.12 2.65    METS 1.74 2.32    RPE 11 13    Perceived Dyspnea  1 0    VO2 Peak 6.09 8.11    Symptoms Yes (comment) Yes (comment)    Comments Hip tightness Hip/Back Pain 6/10    Resting HR 58 bpm 63 bpm    Resting BP 122/62 124/62    Resting Oxygen Saturation  97 % 98 %    Exercise Oxygen Saturation  during 6 min walk 96 % 99 %    Max Ex. HR 91 bpm 95 bpm    Max Ex. BP 146/70 156/74    2 Minute Post BP 132/64 --            Nutrition & Weight - Outcomes:  Pre Biometrics - 03/25/22 1541       Pre Biometrics   Height _0  (1.6 m)    Weight 181 lb (82.1 kg)    Waist Circumference 44.5 inches    Hip Circumference 43 inches    Waist to Hip Ratio 1.03 %    BMI (Calculated) 32.07    Single Leg Stand 30 seconds             Post Biometrics - 05/22/22 1255        Post  Biometrics   Height _1  (1.6 m)     Weight 183 lb 11.2 oz (83.3 kg)    BMI (Calculated) 32.55             Nutrition:  Nutrition Therapy & Goals - 05/06/22 1156       Nutrition Therapy   Diet Heart healthy, Low Na    Drug/Food Interactions Statins/Certain Fruits    Protein (specify units) 80-90g    Fiber 24 grams    Whole Grain Foods 3 servings    Saturated Fats 13 max. grams    Fruits and Vegetables 8 servings/day    Sodium 2 grams      Personal Nutrition Goals   Nutrition Goal ST: practice meal planning tips LT: increase fruit/vegetable intake to 5-8 servings per day, improve fruit/vegetable variety    Comments She enjoys fresh fruit  and does not snack, she limits red meat, limits starchy vegetables, she reports not eating out much (~1x week). She reports baking, but does not eat much of that. She feels that she does not eat enough vegetables. Drinks: water, black coffee (1-2 cups), coke zero (<12oz), lipton diet peach tea, she will order tea or water when at a restaurant  B: oatmeal, cheerios, english muffin or toast (whole wheat/whole grain) with smart balance and some jelly, eggs most mornings (1 full egg) L: whatever she has on hand such as 1/2 sandwich with pimento cheese, fruit with cheese and crackers S: sometimes dark chocolate if she does snack D: Meat (meatloaf, chicken,  pork, ham) and vegetable and fruit like cooked apples. She reports using olive oil spray and butter in smaller amounts as well as canola oil with baking. She reports limiting salt with her cooking. She reports having a sweet tooth and she likes dark chocolate, fruit, and the occasional cookies. She reports she knows what she needs to do, but has a difficult time doing so during the week. Discussed heart healthy eating as well as meal planning which could make it easier to prepare foods during the week.      Intervention Plan   Intervention Prescribe, educate and counsel regarding individualized specific dietary modifications aiming towards  targeted core components such as weight, hypertension, lipid management, diabetes, heart failure and other comorbidities.    Expected Outcomes Short Term Goal: Understand basic principles of dietary content, such as calories, fat, sodium, cholesterol and nutrients.;Short Term Goal: A plan has been developed with personal nutrition goals set during dietitian appointment.;Long Term Goal: Adherence to prescribed nutrition plan.

## 2022-05-22 NOTE — Progress Notes (Signed)
Daily Session Note  Patient Details  Name: Teresa Marquez MRN: [id number] Date of Birth: June 29, 1941 Referring Provider:   Flowsheet Row Cardiac Rehab from 03/25/2022 in [facility] Cardiac and Pulmonary Rehab  Referring Provider Burt Knack       Encounter Date: 05/22/2022  Check In:  Session Check In - 05/22/22 0933       Check-In   Supervising physician immediately available to respond to emergencies See telemetry face sheet for immediately available ER MD    Location [facility]-Cardiac & Pulmonary Rehab    Staff Present Antionette Fairy, BS, Exercise Physiologist;Joseph Rosebud Poles, RN, [place]    Virtual Visit No    Medication changes reported     No    Fall or balance concerns reported    No    Warm-up and Cool-down Performed on first and last piece of equipment    Resistance Training Performed Yes    VAD Patient? No    PAD/SET Patient? No      Pain Assessment   Currently in Pain? No/denies                Social History   Tobacco Use  Smoking Status Never  Smokeless Tobacco Never    Goals Met:  Independence with exercise equipment Exercise tolerated well No report of concerns or symptoms today Strength training completed today  Goals Unmet:  Not Applicable  Comments: Pt able to follow exercise prescription today without complaint.  Will continue to monitor for progression.    Dr. Emily Filbert is Medical Director for [place].  Dr. Ottie Glazier is Medical Director for [facility] Pulmonary Rehabilitation.

## 2022-05-24 ENCOUNTER — Encounter: Payer: Medicare Other | Admitting: *Deleted

## 2022-05-24 DIAGNOSIS — Z951 Presence of aortocoronary bypass graft: Secondary | ICD-10-CM | POA: Diagnosis not present

## 2022-05-24 DIAGNOSIS — Z48812 Encounter for surgical aftercare following surgery on the circulatory system: Secondary | ICD-10-CM | POA: Diagnosis not present

## 2022-05-24 NOTE — Progress Notes (Signed)
Daily Session Note  Patient Details  Name: Teresa Marquez MRN: [id number] Date of Birth: 21-Oct-1940 Referring Provider:   Flowsheet Row Cardiac Rehab from 03/25/2022 in [facility] Cardiac and Pulmonary Rehab  Referring Provider Burt Knack       Encounter Date: 05/24/2022  Check In:  Session Check In - 05/24/22 0923       Check-In   Supervising physician immediately available to respond to emergencies See telemetry face sheet for immediately available ER MD    Location [facility]-Cardiac & Pulmonary Rehab    Staff Present Alberteen Sam, MA, RCEP, CCRP, CCET;Joseph [place], [place], RN, [place]    Virtual Visit No    Medication changes reported     No    Fall or balance concerns reported    No    Warm-up and Cool-down Performed on first and last piece of equipment    Resistance Training Performed Yes    VAD Patient? No    PAD/SET Patient? No      Pain Assessment   Currently in Pain? No/denies                Social History   Tobacco Use  Smoking Status Never  Smokeless Tobacco Never    Goals Met:  Independence with exercise equipment Exercise tolerated well No report of concerns or symptoms today Strength training completed today  Goals Unmet:  Not Applicable  Comments: Pt able to follow exercise prescription today without complaint.  Will continue to monitor for progression.    Dr. Emily Filbert is Medical Director for [place].  Dr. Ottie Glazier is Medical Director for [facility] Pulmonary Rehabilitation.

## 2022-05-27 ENCOUNTER — Encounter: Payer: Medicare Other | Admitting: *Deleted

## 2022-05-27 ENCOUNTER — Ambulatory Visit: Payer: Medicare Other

## 2022-05-27 DIAGNOSIS — Z951 Presence of aortocoronary bypass graft: Secondary | ICD-10-CM

## 2022-05-27 DIAGNOSIS — Z48812 Encounter for surgical aftercare following surgery on the circulatory system: Secondary | ICD-10-CM | POA: Diagnosis not present

## 2022-05-27 NOTE — Progress Notes (Signed)
Daily Session Note  Patient Details  Name: Teresa Marquez MRN: [id number] Date of Birth: 1940/10/19 Referring Provider:   Flowsheet Row Cardiac Rehab from 03/25/2022 in [facility] Cardiac and Pulmonary Rehab  Referring Provider Burt Knack       Encounter Date: 05/27/2022  Check In:  Session Check In - 05/27/22 0935       Check-In   Supervising physician immediately available to respond to emergencies See telemetry face sheet for immediately available ER MD    Location [facility]-Cardiac & Pulmonary Rehab    Staff Present Earlean Shawl, BS, ACSM CEP, Exercise Physiologist;Noah Tickle, BS, Exercise Physiologist;Lolly Glaus Tamala Julian, RN, ADN    Virtual Visit No    Medication changes reported     No    Fall or balance concerns reported    No    Warm-up and Cool-down Performed on first and last piece of equipment    Resistance Training Performed Yes    VAD Patient? No    PAD/SET Patient? No      Pain Assessment   Currently in Pain? No/denies                Social History   Tobacco Use  Smoking Status Never  Smokeless Tobacco Never    Goals Met:  Independence with exercise equipment Exercise tolerated well No report of concerns or symptoms today Strength training completed today  Goals Unmet:  Not Applicable  Comments: Pt able to follow exercise prescription today without complaint.  Will continue to monitor for progression.    Dr. Emily Filbert is Medical Director for [place].  Dr. Ottie Glazier is Medical Director for [facility] Pulmonary Rehabilitation.

## 2022-05-29 ENCOUNTER — Encounter: Payer: Medicare Other | Admitting: *Deleted

## 2022-05-29 DIAGNOSIS — Z48812 Encounter for surgical aftercare following surgery on the circulatory system: Secondary | ICD-10-CM | POA: Diagnosis not present

## 2022-05-29 DIAGNOSIS — Z951 Presence of aortocoronary bypass graft: Secondary | ICD-10-CM

## 2022-05-29 NOTE — Progress Notes (Signed)
Daily Session Note  Patient Details  Name: Teresa Marquez MRN: [id number] Date of Birth: 03/12/1941 Referring Provider:   Flowsheet Row Cardiac Rehab from 03/25/2022 in [facility] Cardiac and Pulmonary Rehab  Referring Provider Burt Knack       Encounter Date: 05/29/2022  Check In:  Session Check In - 05/29/22 0926       Check-In   Supervising physician immediately available to respond to emergencies See telemetry face sheet for immediately available ER MD    Location [facility]-Cardiac & Pulmonary Rehab    Staff Present Coralie Keens, MS, ASCM CEP, Exercise Physiologist;Susanne Bice, RN, BSN, CCRP;Shloma Roggenkamp Tamala Julian, RN, ADN    Virtual Visit No    Medication changes reported     No    Fall or balance concerns reported    No    Warm-up and Cool-down Performed on first and last piece of equipment    Resistance Training Performed Yes    VAD Patient? No    PAD/SET Patient? No      Pain Assessment   Currently in Pain? No/denies                Social History   Tobacco Use  Smoking Status Never  Smokeless Tobacco Never    Goals Met:  Independence with exercise equipment Exercise tolerated well No report of concerns or symptoms today Strength training completed today  Goals Unmet:  Not Applicable  Comments: Pt able to follow exercise prescription today without complaint.  Will continue to monitor for progression.    Dr. Emily Filbert is Medical Director for [place].  Dr. Ottie Glazier is Medical Director for [facility] Pulmonary Rehabilitation.

## 2022-06-02 NOTE — Progress Notes (Unsigned)
Cardiology Office Note:    Date:  06/02/2022   ID:  Teresa Marquez, DOB 10/13/1940, MRN [id number]  PCP:  Merrilee Seashore, MD   [facility] Providers Cardiologist:  Freada Bergeron, MD {  Referring MD: Merrilee Seashore, MD    History of Present Illness:    Teresa Marquez is a 81 y.o. female with a hx of  critical prox LAD disease s/p LIMA-LAD, DMII, HTN, HLD, and GERD who presents to clinic for follow-up.  Patient was initially referred to clinic for chest pain and DOE.  Coronary CTA revealed subtotal occlusion of proximal mid LAD with recommendation of cardiac catheterization for further evaluation.   She was seen by Ambrose Pancoast on 02/11/22 for pre-cath visit and was having chest pain and dynamic ECG changes at that time prompting referral for urgent cath. Cath confirmed critical ostial LAD disease prompting LIMA-LAD. She did well post-op. TTE with LVEF 60-65%, apicolateral hypokinesis, G1DD, normal RV, no valve disease.   Was last seen in follow-up by Nicholes Rough on 03/07/22 where she was doing well form a CV standpoint. Was working with cardiac rehab.  Today, ***   Past Medical History:  Diagnosis Date   Abdominal pain    Anxiety    Basal cell carcinoma    DDD (degenerative disc disease), lumbar    Diabetes mellitus without complication (HCC)    DM (diabetes mellitus) ([place])    Drug-induced myopathy    Elevated blood pressure reading in office without diagnosis of hypertension    GERD (gastroesophageal reflux disease)    Hyperlipidemia    Menopause    NAFLD (nonalcoholic fatty liver disease)    OSA (obstructive sleep apnea)     Past Surgical History:  Procedure Laterality Date   BREAST EXCISIONAL BIOPSY Left    benign   CARPAL TUNNEL RELEASE     CORONARY ARTERY BYPASS GRAFT N/A 02/13/2022   Procedure: CORONARY ARTERY BYPASS GRAFTING (CABG) TIMES ONE USING LIMA.;  Surgeon: Melrose Nakayama, MD;  Location: [facility];  Service: Open Heart  Surgery;  Laterality: N/A;   FOOT SURGERY     LEFT HEART CATH AND CORONARY ANGIOGRAPHY N/A 02/11/2022   Procedure: LEFT HEART CATH AND CORONARY ANGIOGRAPHY;  Surgeon: Sherren Mocha, MD;  Location: [facility];  Service: Cardiovascular;  Laterality: N/A;   NASAL SINUS SURGERY     pituitary adenoma resection     SHOULDER SURGERY     TEE WITHOUT CARDIOVERSION N/A 02/13/2022   Procedure: TRANSESOPHAGEAL ECHOCARDIOGRAM (TEE);  Surgeon: Melrose Nakayama, MD;  Location: [facility];  Service: Open Heart Surgery;  Laterality: N/A;   TOTAL HIP ARTHROPLASTY     TUBAL LIGATION      Current Medications: No outpatient medications have been marked as taking for the 06/04/22 encounter (Appointment) with Freada Bergeron, MD.     Allergies:   Bactrim [sulfamethoxazole-trimethoprim], Keflet [cephalexin], Statins, and Tetracyclines & related   Social History   Socioeconomic History   Marital status: Widowed    Spouse name: Not on file   Number of children: Not on file   Years of education: Not on file   Highest education level: Not on file  Occupational History   Not on file  Tobacco Use   Smoking status: Never   Smokeless tobacco: Never  Vaping Use   Vaping Use: Never used  Substance and Sexual Activity   Alcohol use: Yes   Drug use: Never   Sexual activity: Not on file  Other Topics Concern   Not on file  Social History Narrative   Not on file   Social Determinants of Health   Financial Resource Strain: Not on file  Food Insecurity: Not on file  Transportation Needs: Not on file  Physical Activity: Not on file  Stress: Not on file  Social Connections: Not on file     Family History: The patient's family history includes Cancer in her father and mother.  ROS:   Please see the history of present illness.     All other systems reviewed and are negative.  EKGs/Labs/Other Studies Reviewed:    The following studies were reviewed today: TTE Feb 19, 2022: IMPRESSIONS      1. There is a very small area of apicolateral hypokinesis. Left  ventricular ejection fraction, by estimation, is 60 to 65%. The left  ventricle has normal function. The left ventricle demonstrates regional  wall motion abnormalities (see scoring  diagram/findings for description). There is mild concentric left  ventricular hypertrophy. Left ventricular diastolic parameters are  consistent with Grade I diastolic dysfunction (impaired relaxation).  Elevated left atrial pressure.   2. Right ventricular systolic function is normal. The right ventricular  size is normal. Tricuspid regurgitation signal is inadequate for assessing  PA pressure.   3. The mitral valve is normal in structure. No evidence of mitral valve  regurgitation. No evidence of mitral stenosis.   4. The aortic valve is normal in structure. Aortic valve regurgitation is  not visualized. No aortic stenosis is present.   5. The inferior vena cava is dilated in size with >50% respiratory  variability, suggesting right atrial pressure of 8 mmHg.    Cardiac catheterization 02/11/2022   1.  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  Patent left main with mild distal left main stenosis 3.  Patent left circumflex with mild diffuse nonobstructive disease 4.  Patent RCA (large, dominant vessel) with mild diffuse plaquing but no areas of high-grade stenosis 5.  Mild hypokinesis of the distal anterior wall with preserved overall LVEF estimated at 55 to 65%   Recommend: Resume heparin 2 hours after TR band off, TCTS consultation for consideration of CABG   Left Main  Dist LM lesion is 30% stenosed. There is mild tapering of the distal left main stem with 30% stenosis present    Left Anterior Descending  The LAD has subtotal proximal/ostial stenosis. The vessel tapers at the junction of the proximal to mid LAD as it is somewhat ectatic through the proximal segment. The mid and distal LAD have no significant stenosis.  The LAD terminates at the apex. The first diagonal branch is patent with no significant stenosis.  Ost LAD to Prox LAD lesion is 99% stenosed. The ostium of the LAD demonstrates subtotal stenosis with TIMI-3 flow but high-grade 99% stenosis is present back to the true ostium of the LAD.    Left Circumflex  The vessel exhibits minimal luminal irregularities. The circumflex is patent with mild irregularity in the proximal vessel, mild irregularity in the mid vessel, and no significant stenosis.    Right Coronary Artery  Vessel is large. There is mild diffuse disease throughout the vessel. The RCA is a large, dominant vessel. There is diffuse irregularity throughout the proximal, mid, and distal vessel with mild ectasia and no areas of high-grade stenosis. The PDA and PLA branches are patent with no significant stenoses.  Prox RCA lesion is 30% stenosed.  Dist RCA lesion is 40% stenosed.  Right Posterior Descending Artery  RPDA lesion is 30% stenosed.    Intervention    No interventions have been documented.    Wall Motion                  Left Heart   Left Ventricle The left ventricular size is normal. The left ventricular systolic function is normal. LV end diastolic pressure is normal. The left ventricular ejection fraction is 55-65% by visual estimate. There are LV function abnormalities due to segmental dysfunction.    Coronary Diagrams   Diagnostic Dominance: Right  Intervention      EKG:  EKG is  ordered today.  The ekg ordered today demonstrates NSR with HR 82  Recent Labs: 02/14/2022: Magnesium 2.4 02/16/2022: BUN 14; Creatinine, Ser 0.76; Hemoglobin 9.6; Platelets 175; Potassium 4.3; Sodium 132  Recent Lipid Panel    Component Value Date/Time   CHOL 174 02/12/2022 0808   TRIG 160 (H) 02/12/2022 0808   HDL 36 (L) 02/12/2022 0808   CHOLHDL 4.8 02/12/2022 0808   VLDL 32 02/12/2022 0808   LDLCALC 106 (H) 02/12/2022 0808     Risk Assessment/Calculations:            Physical Exam:    VS:  There were no vitals taken for this visit.    Wt Readings from Last 3 Encounters:  05/22/22 183 lb 11.2 oz (83.3 kg)  03/25/22 181 lb (82.1 kg)  03/19/22 180 lb (81.6 kg)     GEN:  Well nourished, well developed in no acute distress HEENT: Normal NECK: No JVD; No carotid bruits CARDIAC: RRR, 1/6 systolic murmur RUSB RESPIRATORY:  Clear to auscultation without rales, wheezing or rhonchi  ABDOMEN: Soft, non-tender, non-distended MUSCULOSKELETAL:  No edema; No deformity  SKIN: Warm and dry NEUROLOGIC:  Alert and oriented x 3 PSYCHIATRIC:  Normal affect   ASSESSMENT:    No diagnosis found.  PLAN:    In order of problems listed above:  #Critical Ostial LAD Disease s/p LIMA to LAD Noted to have subtotal ostial LAD disease on coronary CTA that was confirmed on cath on 02/2022. Now s/p LIMA to LAD. Currently doing well without anginal symptoms. - Now s/p CABG x1 with LIMA-LAD - Continue metop 12.'5mg'$  BID - Continue ASA '81mg'$  daily - Continue lipitor '80mg'$  daily   #HLD: #Statin Intolerance: Currently tolerating lipitor '80mg'$  daily. -Continue lipitor '80mg'$  daily  #HTN: Improved. -Continue metop 12.'5mg'$  BID  #DMII: Diet controlled and followed by PCP. Last A1C 7.2 -Management per PCP       Medication Adjustments/Labs and Tests Ordered: Current medicines are reviewed at length with the patient today.  Concerns regarding medicines are outlined above.  No orders of the defined types were placed in this encounter.  No orders of the defined types were placed in this encounter.   There are no Patient Instructions on file for this visit.   Signed, Freada Bergeron, MD  06/02/2022 7:49 PM    [place]

## 2022-06-03 ENCOUNTER — Encounter: Payer: Medicare Other | Admitting: *Deleted

## 2022-06-03 DIAGNOSIS — Z951 Presence of aortocoronary bypass graft: Secondary | ICD-10-CM | POA: Diagnosis not present

## 2022-06-03 DIAGNOSIS — Z48812 Encounter for surgical aftercare following surgery on the circulatory system: Secondary | ICD-10-CM | POA: Diagnosis not present

## 2022-06-03 NOTE — Progress Notes (Signed)
Daily Session Note  Patient Details  Name: Teresa Marquez MRN: [id number] Date of Birth: 05-17-1941 Referring Provider:   Flowsheet Row Cardiac Rehab from 03/25/2022 in [facility]  Referring Provider Burt Knack       Encounter Date: 06/03/2022  Check In:  Session Check In - 06/03/22 1031       Check-In   Supervising physician immediately available to respond to emergencies See telemetry face sheet for immediately available ER MD    Location [facility]    Staff Present Antionette Fairy, BS, Exercise Physiologist;Kelly Amedeo Plenty, BS, ACSM CEP, Exercise Physiologist;Zoanne Newill Tamala Julian, RN, ADN    Virtual Visit No    Medication changes reported     No    Fall or balance concerns reported    No    Warm-up and Cool-down Performed on first and last piece of equipment    Resistance Training Performed Yes    VAD Patient? No    PAD/SET Patient? No      Pain Assessment   Currently in Pain? No/denies                Social History   Tobacco Use  Smoking Status Never  Smokeless Tobacco Never    Goals Met:  Independence with exercise equipment Exercise tolerated well No report of concerns or symptoms today Strength training completed today  Goals Unmet:  Not Applicable  Comments:  Teresa Marquez graduated today from  rehab with 36 sessions completed.  Details of the patient's exercise prescription and what She needs to do in order to continue the prescription and progress were discussed with patient.  Patient was given a copy of prescription and goals.  Patient verbalized understanding.  Teresa Marquez plans to continue to exercise by using aerobic machines and walking.    Dr. Emily Filbert is Medical Director for [place].  Dr. Ottie Glazier is Medical Director for [facility].

## 2022-06-03 NOTE — Progress Notes (Signed)
Cardiac Individual Treatment Plan  Patient Details  Name: Teresa Marquez MRN: [id number] Date of Birth: 1940/12/09 Referring Provider:   Flowsheet Row Cardiac Rehab from 03/25/2022 in [facility] Cardiac and Pulmonary Rehab  Referring Provider Burt Knack       Initial Encounter Date:  Flowsheet Row Cardiac Rehab from 03/25/2022 in [facility] Cardiac and Pulmonary Rehab  Date 03/25/22       Visit Diagnosis: S/P CABG x 1  Patient's Home Medications on Admission:  Current Outpatient Medications:    acetaminophen (TYLENOL) 500 MG tablet, Take 500 mg by mouth every 6 (six) hours as needed for moderate pain., Disp: , Rfl:    amiodarone (PACERONE) 200 MG tablet, Take 1 tablet (200 mg total) by mouth 2 (two) times daily. X 7 days, then decrease to 200 mg daily, Disp: 60 tablet, Rfl: 1   aspirin 81 MG chewable tablet, Chew 81 mg by mouth daily., Disp: , Rfl:    atorvastatin (LIPITOR) 80 MG tablet, Take 1 tablet (80 mg total) by mouth daily., Disp: 30 tablet, Rfl: 3   ezetimibe (ZETIA) 10 MG tablet, Take 10 mg by mouth daily., Disp: , Rfl:    fluticasone (FLONASE) 50 MCG/ACT nasal spray, Place 2 sprays into both nostrils every evening., Disp: , Rfl:    folic acid (FOLVITE) 841 MCG tablet, Take 400 mcg daily by mouth., Disp: , Rfl:    metoprolol tartrate (LOPRESSOR) 25 MG tablet, Take 0.5 tablets (12.5 mg total) by mouth 2 (two) times daily., Disp: 60 tablet, Rfl: 3   MISC NATURAL PRODUCTS PO, Take 1 Application by mouth daily. Milk Thistle, Disp: , Rfl:    Multiple Vitamins-Minerals (HAIR SKIN & NAILS ADVANCED PO), Take 1 tablet by mouth daily., Disp: , Rfl:    omeprazole (PRILOSEC) 20 MG capsule, Take 10 mg by mouth every evening., Disp: , Rfl:    Polyvinyl Alcohol-Povidone (REFRESH OP), Place 1 drop into both eyes daily., Disp: , Rfl:    Pseudoeph-Bromphen-DM (BROMFED DM PO), 5 ml as needed, Disp: , Rfl:    Turmeric 500 MG CAPS, Take 2 capsules by mouth daily., Disp: , Rfl:    vitamin B-12  (CYANOCOBALAMIN) 1000 MCG tablet, Take 1,000 mcg daily by mouth., Disp: , Rfl:   Past Medical History: Past Medical History:  Diagnosis Date   Abdominal pain    Anxiety    Basal cell carcinoma    DDD (degenerative disc disease), lumbar    Diabetes mellitus without complication ([place])    DM (diabetes mellitus) ([place])    Drug-induced myopathy    Elevated blood pressure reading in office without diagnosis of hypertension    GERD (gastroesophageal reflux disease)    Hyperlipidemia    Menopause    NAFLD (nonalcoholic fatty liver disease)    OSA (obstructive sleep apnea)     Tobacco Use: Social History   Tobacco Use  Smoking Status Never  Smokeless Tobacco Never    Labs: Review Flowsheet       Latest Ref Rng & Units 02/11/2022 02/12/2022 02/13/2022  Labs for ITP Cardiac and Pulmonary Rehab  Cholestrol 0 - 200 mg/dL - 174  -  LDL (calc) 0 - 99 mg/dL - 106  -  HDL-C >40 mg/dL - 36  -  Trlycerides <150 mg/dL - 160  -  Hemoglobin A1c 4.8 - 5.6 % - 7.0  -  PH, Arterial 7.35 - 7.45 - - 7.311  7.357  7.322  7.421  7.461  7.434  7.372   PCO2 arterial  32 - 48 mmHg - - 43.8  47.0  44.6  37.2  37.9  36.9  45.8   Bicarbonate 20.0 - 28.0 mmol/L - - 22.2  26.6  23.4  24.2  27.0  24.7  26.6   TCO2 22 - 32 mmol/L 24  - _0 Acid-base deficit 0.0 - 2.0 mmol/L - - 4.0  3.0   O2 Saturation % - - 95  99  95  95  100  100  100      Exercise Target Goals: Exercise Program Goal: Individual exercise prescription set using results from initial 6 min walk test and THRR while considering  patient's activity barriers and safety.   Exercise Prescription Goal: Initial exercise prescription builds to 30-45 minutes a day of aerobic activity, 2-3 days per week.  Home exercise guidelines will be given to patient during program as part of exercise prescription that the participant will acknowledge.   Education: Aerobic Exercise: - Group verbal and visual presentation  on the components of exercise prescription. Introduces F.I.T.T principle from ACSM for exercise prescriptions.  Reviews F.I.T.T. principles of aerobic exercise including progression. Written material given at graduation. Flowsheet Row Cardiac Rehab from 05/29/2022 in [facility] Cardiac and Pulmonary Rehab  Date 05/15/22  Educator [facility]  Instruction Review Code 1- Verbalizes Understanding       Education: Resistance Exercise: - Group verbal and visual presentation on the components of exercise prescription. Introduces F.I.T.T principle from ACSM for exercise prescriptions  Reviews F.I.T.T. principles of resistance exercise including progression. Written material given at graduation. Flowsheet Row Cardiac Rehab from 05/29/2022 in [facility] Cardiac and Pulmonary Rehab  Date 05/29/22  Educator NT  Instruction Review Code 1- Verbalizes Understanding        Education: Exercise & Equipment Safety: - Individual verbal instruction and demonstration of equipment use and safety with use of the equipment. Flowsheet Row Cardiac Rehab from 05/29/2022 in [facility] Cardiac and Pulmonary Rehab  Date 03/25/22  Educator NT  Instruction Review Code 1- Verbalizes Understanding       Education: Exercise Physiology & General Exercise Guidelines: - Group verbal and written instruction with models to review the exercise physiology of the cardiovascular system and associated critical values. Provides general exercise guidelines with specific guidelines to those with heart or lung disease.  Flowsheet Row Cardiac Rehab from 05/29/2022 in [facility] Cardiac and Pulmonary Rehab  Date 05/08/22  Educator [facility]  Instruction Review Code 1- Verbalizes Understanding       Education: Flexibility, Balance, Mind/Body Relaxation: - Group verbal and visual presentation with interactive activity on the components of exercise prescription. Introduces F.I.T.T principle from ACSM for exercise prescriptions. Reviews F.I.T.T. principles of  flexibility and balance exercise training including progression. Also discusses the mind body connection.  Reviews various relaxation techniques to help reduce and manage stress (i.e. Deep breathing, progressive muscle relaxation, and visualization). Balance handout provided to take home. Written material given at graduation. Flowsheet Row Cardiac Rehab from 05/29/2022 in [facility] Cardiac and Pulmonary Rehab  Date 05/29/22  Educator NT  Instruction Review Code 1- Verbalizes Understanding       Activity Barriers & Risk Stratification:  Activity Barriers & Cardiac Risk Stratification - 03/25/22 1526       Activity Barriers & Cardiac Risk Stratification   Activity Barriers None;Muscular Weakness;Deconditioning;Other (comment)    Comments Hip Fatigue/Pain    Cardiac Risk Stratification Moderate  6 Minute Walk:  6 Minute Walk     Row Name 03/25/22 1525 05/22/22 1254       6 Minute Walk   Phase Initial Discharge    Distance 1130 feet 1400 feet    Distance % Change -- 25 %    Distance Feet Change -- 270 ft    Walk Time 6 minutes 6 minutes    # of Rest Breaks 0 0    MPH 2.12 2.65    METS 1.74 2.32    RPE 11 13    Perceived Dyspnea  1 0    VO2 Peak 6.09 8.11    Symptoms Yes (comment) Yes (comment)    Comments Hip tightness Hip/Back Pain 6/10    Resting HR 58 bpm 63 bpm    Resting BP 122/62 124/62    Resting Oxygen Saturation  97 % 98 %    Exercise Oxygen Saturation  during 6 min walk 96 % 99 %    Max Ex. HR 91 bpm 95 bpm    Max Ex. BP 146/70 156/74    2 Minute Post BP 132/64 --             Oxygen Initial Assessment:   Oxygen Re-Evaluation:   Oxygen Discharge (Final Oxygen Re-Evaluation):   Initial Exercise Prescription:  Initial Exercise Prescription - 03/25/22 1500       Date of Initial Exercise RX and Referring Provider   Date 03/25/22    Referring Provider Burt Knack      Oxygen   Maintain Oxygen Saturation 88% or higher      Bike   Level 1     Watts 15    Minutes 15    METs 1.74      NuStep   Level 1    SPM 80    Minutes 15    METs 1.74      T5 Nustep   Level 1    SPM 80    Minutes 15    METs 1.74      Biostep-RELP   Level 1    SPM 50    Minutes 15    METs 1.74      Track   Laps 22    Minutes 15    METs 2.2      Prescription Details   Frequency (times per week) 3    Duration Progress to 30 minutes of continuous aerobic without signs/symptoms of physical distress      Intensity   THRR 40-80% of Max Heartrate 90-124    Ratings of Perceived Exertion 11-13    Perceived Dyspnea 0-4      Progression   Progression Continue to progress workloads to maintain intensity without signs/symptoms of physical distress.      Resistance Training   Training Prescription Yes    Weight 2 lb    Reps 10-15             Perform Capillary Blood Glucose checks as needed.  Exercise Prescription Changes:   Exercise Prescription Changes     Row Name 03/25/22 1500 04/08/22 1100 04/10/22 1000 04/25/22 1600 05/07/22 1500     Response to Exercise   Blood Pressure (Admit) 122/62 122/60 -- 132/64 122/60   Blood Pressure (Exercise) 146/70 168/76 -- 164/78 --   Blood Pressure (Exit) 132/64 110/58 -- 124/54 126/70   Heart Rate (Admit) 58 bpm 66 bpm -- 63 bpm 63 bpm   Heart Rate (Exercise) 91 bpm 96 bpm -- 104  bpm 93 bpm   Heart Rate (Exit) 63 bpm 63 bpm -- 69 bpm 64 bpm   Oxygen Saturation (Admit) 97 % -- -- -- --   Oxygen Saturation (Exercise) 96 % -- -- -- --   Rating of Perceived Exertion (Exercise) 11 12 -- 14 15   Perceived Dyspnea (Exercise) 1 -- -- -- --   Symptoms Hip Tightness none -- none none   Comments 6MWT Results 3rd full day of exercise -- -- --   Duration -- Progress to 30 minutes of  aerobic without signs/symptoms of physical distress -- Continue with 30 min of aerobic exercise without signs/symptoms of physical distress. Continue with 30 min of aerobic exercise without signs/symptoms of physical  distress.   Intensity -- THRR unchanged -- THRR unchanged THRR unchanged     Progression   Progression -- Continue to progress workloads to maintain intensity without signs/symptoms of physical distress. -- Continue to progress workloads to maintain intensity without signs/symptoms of physical distress. Continue to progress workloads to maintain intensity without signs/symptoms of physical distress.   Average METs -- 2.27 -- 2.71 2.86     Resistance Training   Training Prescription -- Yes -- Yes Yes   Weight -- 2 lb -- 2 lb 2 lb   Reps -- 10-15 -- 10-15 10-15     Interval Training   Interval Training -- No -- No No     Bike   Level -- -- -- 1 --   Watts -- -- -- 18 --   Minutes -- -- -- 15 --   METs -- -- -- 2.68 --     NuStep   Level -- 3 -- 3 4   Minutes -- 15 -- 15 15   METs -- -- -- 2.8 3.4     T5 Nustep   Level -- -- -- 3 --   Minutes -- -- -- 15 --   METs -- -- -- 2.1 --     Biostep-RELP   Level -- 3 -- 4 3   Minutes -- 15 -- 15 15   METs -- 3 -- 3 3     Track   Laps -- 26 -- 33 32   Minutes -- 15 -- 15 15   METs -- 2.41 -- 2.79 2.27     Home Exercise Plan   Plans to continue exercise at -- -- [employer] (comment)  [employer]; plans to use aerobic machines and walk [employer] (comment)  [employer]; plans to use aerobic machines and walk [employer] (comment)  [employer]; plans to use aerobic machines and walk   Frequency -- -- Add 2 additional days to program exercise sessions. Add 2 additional days to program exercise sessions. Add 2 additional days to program exercise sessions.   Initial Home Exercises Provided -- -- 04/10/22 04/10/22 04/10/22     Oxygen   Maintain Oxygen Saturation -- 88% or higher 88% or higher 88% or higher 88% or higher    Row Name 05/22/22 1600             Response to Exercise   Blood Pressure (Admit) 118/60       Blood Pressure (Exit) 122/62       Heart Rate (Admit) 60 bpm       Heart Rate (Exercise) 84 bpm       Heart  Rate (Exit) 66 bpm       Rating of Perceived Exertion (Exercise) 14       Symptoms  none       Duration Continue with 30 min of aerobic exercise without signs/symptoms of physical distress.       Intensity THRR unchanged         Progression   Progression Continue to progress workloads to maintain intensity without signs/symptoms of physical distress.       Average METs 2.92         Resistance Training   Training Prescription Yes       Weight 2 lb       Reps 10-15         Interval Training   Interval Training No         NuStep   Level 4       Minutes 15       METs 3.2         Biostep-RELP   Level 3       Minutes 15       METs 3         Track   Laps 36       Minutes 15       METs 2.96         Home Exercise Plan   Plans to continue exercise at [employer] (comment)  [employer]; plans to use aerobic machines and walk       Frequency Add 2 additional days to program exercise sessions.       Initial Home Exercises Provided 04/10/22         Oxygen   Maintain Oxygen Saturation 88% or higher                Exercise Comments:   Exercise Comments     Row Name 04/01/22 0941           Exercise Comments First full day of exercise!  Patient was oriented to gym and equipment including functions, settings, policies, and procedures.  Patient's individual exercise prescription and treatment plan were reviewed.  All starting workloads were established based on the results of the 6 minute walk test done at initial orientation visit.  The plan for exercise progression was also introduced and progression will be customized based on patient's performance and goals.                Exercise Goals and Review:   Exercise Goals     Row Name 03/25/22 1541             Exercise Goals   Increase Physical Activity Yes       Intervention Provide advice, education, support and counseling about physical activity/exercise needs.;Develop an individualized exercise prescription  for aerobic and resistive training based on initial evaluation findings, risk stratification, comorbidities and participant's personal goals.       Expected Outcomes Short Term: Attend rehab on a regular basis to increase amount of physical activity.;Long Term: Add in home exercise to make exercise part of routine and to increase amount of physical activity.;Long Term: Exercising regularly at least 3-5 days a week.       Increase Strength and Stamina Yes       Intervention Provide advice, education, support and counseling about physical activity/exercise needs.;Develop an individualized exercise prescription for aerobic and resistive training based on initial evaluation findings, risk stratification, comorbidities and participant's personal goals.       Expected Outcomes Short Term: Increase workloads from initial exercise prescription for resistance, speed, and METs.;Long Term: Improve cardiorespiratory fitness, muscular endurance and strength as measured by  increased METs and functional capacity (6MWT);Short Term: Perform resistance training exercises routinely during rehab and add in resistance training at home       Able to understand and use rate of perceived exertion (RPE) scale Yes       Intervention Provide education and explanation on how to use RPE scale       Expected Outcomes Short Term: Able to use RPE daily in rehab to express subjective intensity level;Long Term:  Able to use RPE to guide intensity level when exercising independently       Able to understand and use Dyspnea scale Yes       Intervention Provide education and explanation on how to use Dyspnea scale       Expected Outcomes Short Term: Able to use Dyspnea scale daily in rehab to express subjective sense of shortness of breath during exertion;Long Term: Able to use Dyspnea scale to guide intensity level when exercising independently       Knowledge and understanding of Target Heart Rate Range (THRR) Yes       Intervention  Provide education and explanation of THRR including how the numbers were predicted and where they are located for reference       Expected Outcomes Short Term: Able to state/look up THRR;Short Term: Able to use daily as guideline for intensity in rehab;Long Term: Able to use THRR to govern intensity when exercising independently       Able to check pulse independently Yes       Intervention Provide education and demonstration on how to check pulse in carotid and radial arteries.;Review the importance of being able to check your own pulse for safety during independent exercise       Expected Outcomes Short Term: Able to explain why pulse checking is important during independent exercise;Long Term: Able to check pulse independently and accurately       Understanding of Exercise Prescription Yes       Intervention Provide education, explanation, and written materials on patient's individual exercise prescription       Expected Outcomes Long Term: Able to explain home exercise prescription to exercise independently;Short Term: Able to explain program exercise prescription                Exercise Goals Re-Evaluation :  Exercise Goals Re-Evaluation     Row Name 04/01/22 0941 04/08/22 1111 04/10/22 1023 04/24/22 0935 04/25/22 1650     Exercise Goal Re-Evaluation   Exercise Goals Review Increase Physical Activity;Increase Strength and Stamina;Able to understand and use rate of perceived exertion (RPE) scale;Able to understand and use Dyspnea scale;Knowledge and understanding of Target Heart Rate Range (THRR);Understanding of Exercise Prescription Increase Physical Activity;Increase Strength and Stamina;Understanding of Exercise Prescription Increase Physical Activity;Increase Strength and Stamina;Understanding of Exercise Prescription Increase Physical Activity;Increase Strength and Stamina;Understanding of Exercise Prescription Increase Physical Activity;Increase Strength and Stamina;Understanding of  Exercise Prescription   Comments Reviewed RPE and dyspnea scales, THR and program prescription with pt today.  Pt voiced understanding and was given a copy of goals to take home. Corah is doing well for the first couple sessions she has been here.  She was able to walk 26 laps on the track and has already increased her level on both the BS and T4 Nustep. We will continue to monitor as she progresses in the program. Reviewed home exercise with pt today.  Pt plans to go to the [facility] and use aerobic machines and walk for exercise.  Reviewed THR, pulse, RPE,  sign and symptoms, pulse oximetery and when to call 911 or MD.  Also discussed weather considerations and indoor options.  Pt voiced understanding. Vadie is doing well in rehab. She states seeing a few benefits from exercise, specifically she feels that she has more enery. She also states that because of her increase in energy she has been able to get more done around her home. She has had some trouble with her hip that has bothered her during exercise. She has been walking some at home but statest that it is not as much as she should. She plans to begin going to the [facility] for exercise on days away from the program. We will continue to monitor. Kahley continues to do well in rehab. She increased her overall average MET level to 2.71 METs. She also increased her walking to 30 laps on the track. She also improved to level 4 on the biostep. We will continue to monitor her progress in the program.   Expected Outcomes Short: Use RPE daily to regulate intensity. Long: Follow program prescription in THR. Short: Continue to increase laps on track Long: Continue to increase overall MET level Short: Walk on days away from rehab. Long: Continue to exercise independently. Short: Begin walking more at home. Long: Continue to exercise independently. Short: Begin walking more at home. Long: Continue to increase strength and stamina.    Kim Name 05/07/22 1514 05/22/22  1637 05/24/22 1026         Exercise Goal Re-Evaluation   Exercise Goals Review Increase Physical Activity;Increase Strength and Stamina;Understanding of Exercise Prescription Increase Physical Activity;Increase Strength and Stamina;Understanding of Exercise Prescription Increase Physical Activity;Increase Strength and Stamina;Understanding of Exercise Prescription     Comments Tatiyanna continues to do well in rehab. She increased her overall average MET level to 2.86 METs. She is also consistently walking more than 30 laps on the track. She also improved to level 4 on the T4. We will continue to monitor her progress in the program. Hlee continues to do well in rehab. She increased her overall average MET level to 2.92 METs. She also has walked up to 36 laps on the track. She completed her post 6MWT as well and improved by 25%! We will continue to monitor her progress in the program. Deosha is nearing graduation.  She already has plans to conitnue to exercise at the [facility] in [place].  She has noticed that her strength and stamina have both improved and keep improving.  She hopes to eventually make it back to her normal again.     Expected Outcomes Short: Continue to increase workloads and laps on the track. Long: Continue to increase strength and stamina. Short: Graduate. Long: Continue to exercise independently. Short: Graduate. Long: Continue to exercise independently.              Discharge Exercise Prescription (Final Exercise Prescription Changes):  Exercise Prescription Changes - 05/22/22 1600       Response to Exercise   Blood Pressure (Admit) 118/60    Blood Pressure (Exit) 122/62    Heart Rate (Admit) 60 bpm    Heart Rate (Exercise) 84 bpm    Heart Rate (Exit) 66 bpm    Rating of Perceived Exertion (Exercise) 14    Symptoms none    Duration Continue with 30 min of aerobic exercise without signs/symptoms of physical distress.    Intensity THRR unchanged       Progression   Progression Continue to progress workloads to maintain intensity without  signs/symptoms of physical distress.    Average METs 2.92      Resistance Training   Training Prescription Yes    Weight 2 lb    Reps 10-15      Interval Training   Interval Training No      NuStep   Level 4    Minutes 15    METs 3.2      Biostep-RELP   Level 3    Minutes 15    METs 3      Track   Laps 36    Minutes 15    METs 2.96      Home Exercise Plan   Plans to continue exercise at [employer] (comment)   [employer]; plans to use aerobic machines and walk   Frequency Add 2 additional days to program exercise sessions.    Initial Home Exercises Provided 04/10/22      Oxygen   Maintain Oxygen Saturation 88% or higher             Nutrition:  Target Goals: Understanding of nutrition guidelines, daily intake of sodium <1513m, cholesterol <2039m calories 30% from fat and 7% or less from saturated fats, daily to have 5 or more servings of fruits and vegetables.  Education: All About Nutrition: -Group instruction provided by verbal, written material, interactive activities, discussions, models, and posters to present general guidelines for heart healthy nutrition including fat, fiber, MyPlate, the role of sodium in heart healthy nutrition, utilization of the nutrition label, and utilization of this knowledge for meal planning. Follow up email sent as well. Written material given at graduation. [facility]  Date 05/22/22  [facility] Review Code 1- Verbalizes Understanding       Biometrics:  Pre Biometrics - 03/25/22 1541       Pre Biometrics   Height _0  (1.6 m)    Weight 181 lb (82.1 kg)    Waist Circumference 44.5 inches    Hip Circumference 43 inches    Waist to Hip Ratio 1.03 %    BMI (Calculated) 32.07    Single Leg Stand 30 seconds             Post Biometrics - 05/22/22  1255        Post  Biometrics   Height _1  (1.6 m)    Weight 183 lb 11.2 oz (83.3 kg)    BMI (Calculated) 32.55             Nutrition Therapy Plan and Nutrition Goals:  Nutrition Therapy & Goals - 05/06/22 1156       Nutrition Therapy   Diet Heart healthy, Low Na    Drug/Food Interactions Statins/Certain Fruits    Protein (specify units) 80-90g    Fiber 24 grams    Whole Grain Foods 3 servings    Saturated Fats 13 max. grams    Fruits and Vegetables 8 servings/day    Sodium 2 grams      Personal Nutrition Goals   Nutrition Goal ST: practice meal planning tips LT: increase fruit/vegetable intake to 5-8 servings per day, improve fruit/vegetable variety    Comments She enjoys fresh fruit  and does not snack, she limits red meat, limits starchy vegetables, she reports not eating out much (~1x week). She reports baking, but does not eat much of that. She feels that she does not eat enough vegetables. Drinks: water, black coffee (1-2 cups), coke  zero (<12oz), lipton diet peach tea, she will order tea or water when at a restaurant  B: oatmeal, cheerios, english muffin or toast (whole wheat/whole grain) with smart balance and some jelly, eggs most mornings (1 full egg) L: whatever she has on hand such as 1/2 sandwich with pimento cheese, fruit with cheese and crackers S: sometimes dark chocolate if she does snack D: Meat (meatloaf, chicken, pork, ham) and vegetable and fruit like cooked apples. She reports using olive oil spray and butter in smaller amounts as well as canola oil with baking. She reports limiting salt with her cooking. She reports having a sweet tooth and she likes dark chocolate, fruit, and the occasional cookies. She reports she knows what she needs to do, but has a difficult time doing so during the week. Discussed heart healthy eating as well as meal planning which could make it easier to prepare foods during the week.      Intervention Plan   Intervention Prescribe,  educate and counsel regarding individualized specific dietary modifications aiming towards targeted core components such as weight, hypertension, lipid management, diabetes, heart failure and other comorbidities.    Expected Outcomes Short Term Goal: Understand basic principles of dietary content, such as calories, fat, sodium, cholesterol and nutrients.;Short Term Goal: A plan has been developed with personal nutrition goals set during dietitian appointment.;Long Term Goal: Adherence to prescribed nutrition plan.             Nutrition Assessments:  MEDIFICTS Score Key: ?70 Need to make dietary changes  40-70 Heart Healthy Diet ? 40 Therapeutic Level Cholesterol Diet  Flowsheet Row Cardiac Rehab from 05/27/2022 in [facility] Cardiac and Pulmonary Rehab  Picture Your Plate Total Score on Discharge 67      Picture Your Plate Scores: <14 Unhealthy dietary pattern with much room for improvement. 41-50 Dietary pattern unlikely to meet recommendations for good health and room for improvement. 51-60 More healthful dietary pattern, with some room for improvement.  >60 Healthy dietary pattern, although there may be some specific behaviors that could be improved.    Nutrition Goals Re-Evaluation:  Nutrition Goals Re-Evaluation     [place] Name 05/24/22 1028             Goals   Nutrition Goal ST: practice meal planning tips LT: increase fruit/vegetable intake to 5-8 servings per day, improve fruit/vegetable variety       Comment Rivky is doing well in rehab.  She is working on meal planning still. She is still getting a good variety of fruits and vegetables.       Expected Outcome Continue to work meal planning with variety                Nutrition Goals Discharge (Final Nutrition Goals Re-Evaluation):  Nutrition Goals Re-Evaluation - 05/24/22 1028       Goals   Nutrition Goal ST: practice meal planning tips LT: increase fruit/vegetable intake to 5-8 servings per day, improve  fruit/vegetable variety    Comment Aleecia is doing well in rehab.  She is working on meal planning still. She is still getting a good variety of fruits and vegetables.    Expected Outcome Continue to work meal planning with variety             Psychosocial: Target Goals: Acknowledge presence or absence of significant depression and/or stress, maximize coping skills, provide positive support system. Participant is able to verbalize types and ability to use techniques and skills needed for reducing stress  and depression.   Education: Stress, Anxiety, and Depression - Group verbal and visual presentation to define topics covered.  Reviews how body is impacted by stress, anxiety, and depression.  Also discusses healthy ways to reduce stress and to treat/manage anxiety and depression.  Written material given at graduation. Flowsheet Row Cardiac Rehab from 05/29/2022 in [facility] Cardiac and Pulmonary Rehab  Date 05/01/22  Educator [facility]  Instruction Review Code 1- [employer] Understanding       Education: Sleep Hygiene -Provides group verbal and written instruction about how sleep can affect your health.  Define sleep hygiene, discuss sleep cycles and impact of sleep habits. Review good sleep hygiene tips.    Initial Review & Psychosocial Screening:  Initial Psych Review & Screening - 03/12/22 1339       Initial Review   Current issues with Current Stress Concerns    Comments Recent stress over selling property, issues in church, family visiting      [place]? Yes   friends, 2 sons     Barriers   Psychosocial barriers to participate in program There are no identifiable barriers or psychosocial needs.;The patient should benefit from training in stress management and relaxation.      Screening Interventions   Interventions Encouraged to exercise;Provide feedback about the scores to participant;To provide support and resources with identified psychosocial  needs    Expected Outcomes Short Term goal: Utilizing psychosocial counselor, staff and physician to assist with identification of specific Stressors or current issues interfering with healing process. Setting desired goal for each stressor or current issue identified.;Long Term Goal: Stressors or current issues are controlled or eliminated.;Long Term goal: The participant improves quality of Life and PHQ9 Scores as seen by post scores and/or verbalization of changes;Short Term goal: Identification and review with participant of any Quality of Life or Depression concerns found by scoring the questionnaire.             Quality of Life Scores:   Quality of Life - 05/27/22 0958       Quality of Life   Select Quality of Life      Quality of Life Scores   Health/Function Pre 22.29 %    Health/Function Post 26.57 %    Health/Function % Change 19.2 %    Socioeconomic Pre 30 %    Socioeconomic Post 30 %    Socioeconomic % Change  0 %    Psych/Spiritual Pre 27.43 %    Psych/Spiritual Post 27.43 %    Psych/Spiritual % Change 0 %    Family Pre 28.5 %    Family Post 25.5 %    Family % Change -10.53 %    GLOBAL Pre 25.6 %    GLOBAL Post 27.29 %    GLOBAL % Change 6.6 %            Scores of 19 and below usually indicate a poorer quality of life in these areas.  A difference of  2-3 points is a clinically meaningful difference.  A difference of 2-3 points in the total score of the Quality of Life Index has been associated with significant improvement in overall quality of life, self-image, physical symptoms, and general health in studies assessing change in quality of life.  PHQ-9: Review Flowsheet       05/27/2022 03/25/2022 05/20/2017  Depression screen PHQ 2/9  Decreased Interest 0 1 0  Down, Depressed, Hopeless 0 1 0  PHQ - 2 Score 0  2 0  Altered sleeping 1 1 -  Tired, decreased energy 1 1 -  Change in appetite 1 0 -  Feeling bad or failure about yourself  0 0 -  Trouble  concentrating 0 1 -  Moving slowly or fidgety/restless 0 0 -  Suicidal thoughts 0 0 -  PHQ-9 Score 3 5 -  Difficult doing work/chores Not difficult at all Not difficult at all -   Interpretation of Total Score  Total Score Depression Severity:  1-4 = Minimal depression, 5-9 = Mild depression, 10-14 = Moderate depression, 15-19 = Moderately severe depression, 20-27 = Severe depression   Psychosocial Evaluation and Intervention:  Psychosocial Evaluation - 03/12/22 1353       Psychosocial Evaluation & Interventions   Interventions Encouraged to exercise with the program and follow exercise prescription;Stress management education;Relaxation education    Comments Ms. 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 She has been reducing her help throughout the day and this week does not have anyone coming to help her. She is doing more around the house and feels comfortable with her progress. She hasn't driven yet, but possibly might this weekend. She reports a lot of stress leading up to her surgery, which included trying to sell a property that was very difficult with the lawyers and families involved. She also states her church has had a lot of issues recently and she had been studying to lead a Bible Study. These events overshadowed a visit from family that she was looking forward to. All in all, she states a lot was happening at the time of the event, so she is ready to ease her way back into her usual routine and work on boosting her stamina. She used to work out at [employer] consistently and wishes to return to that routine    Expected Outcomes Short: attend cardiac rehab for education and exercise. Long: develop and maintain positive self care habits.    Continue Psychosocial Services  Follow up required by staff             Psychosocial Re-Evaluation:  Psychosocial Re-Evaluation     [place] Name 04/15/22 0955 05/24/22 1027           Psychosocial Re-Evaluation    Current issues with Current Stress Concerns Current Stress Concerns      Comments Laporshia reports no new stress concerns. She does say that she sleeps about 5 hours a night, but does feel rested. Patient reports that she has a good support system. Teela is doing wel in rehab. She is nearing graduation. She is sleeping well. She has enjoyed rehab and learned a lot.  She really liked meeting people and all the educaiton classes.      Expected Outcomes Short: continue to attend rehab and exercise for mental health benefits. Long: maintin good mental health habits. Continue to stay positive and exercise for mood benefits      Interventions Encouraged to attend Cardiac Rehabilitation for the exercise Encouraged to attend Cardiac Rehabilitation for the exercise      Continue Psychosocial Services  Follow up required by staff Follow up required by staff               Psychosocial Discharge (Final Psychosocial Re-Evaluation):  Psychosocial Re-Evaluation - 05/24/22 1027       Psychosocial Re-Evaluation   Current issues with Current Stress Concerns    Comments Jaline is doing wel in rehab. She is nearing graduation. She  is sleeping well. She has enjoyed rehab and learned a lot.  She really liked meeting people and all the educaiton classes.    Expected Outcomes Continue to stay positive and exercise for mood benefits    Interventions Encouraged to attend Cardiac Rehabilitation for the exercise    Continue Psychosocial Services  Follow up required by staff             Vocational Rehabilitation: Provide vocational rehab assistance to qualifying candidates.   Vocational Rehab Evaluation & Intervention:  Vocational Rehab - 03/12/22 1338       Initial Vocational Rehab Evaluation & Intervention   Assessment shows need for Vocational Rehabilitation No             Education: Education Goals: Education classes will be provided on a variety of topics geared toward better  understanding of heart health and risk factor modification. Participant will state understanding/return demonstration of topics presented as noted by education test scores.  Learning Barriers/Preferences:  Learning Barriers/Preferences - 03/12/22 1338       Learning Barriers/Preferences   Learning Barriers None    Learning Preferences Individual Instruction             General Cardiac Education Topics:  AED/CPR: - Group verbal and written instruction with the use of models to demonstrate the basic use of the AED with the basic ABC's of resuscitation.   Anatomy and Cardiac Procedures: - Group verbal and visual presentation and models provide information about basic cardiac anatomy and function. Reviews the testing methods done to diagnose heart disease and the outcomes of the test results. Describes the treatment choices: Medical Management, Angioplasty, or Coronary Bypass Surgery for treating various heart conditions including Myocardial Infarction, Angina, Valve Disease, and Cardiac Arrhythmias.  Written material given at graduation.   Medication Safety: - Group verbal and visual instruction to review commonly prescribed medications for heart and lung disease. Reviews the medication, class of the drug, and side effects. Includes the steps to properly store meds and maintain the prescription regimen.  Written material given at graduation. Flowsheet Row Cardiac Rehab from 05/29/2022 in [facility] Cardiac and Pulmonary Rehab  Date 04/10/22  Educator SB  Instruction Review Code 1- Verbalizes Understanding       Intimacy: - Group verbal instruction through game format to discuss how heart and lung disease can affect sexual intimacy. Written material given at graduation.. Flowsheet Row Cardiac Rehab from 05/29/2022 in [facility] Cardiac and Pulmonary Rehab  Date 05/15/22  Educator [facility]  Instruction Review Code 1- Verbalizes Understanding       Know Your Numbers and Heart Failure: - Group  verbal and visual instruction to discuss disease risk factors for cardiac and pulmonary disease and treatment options.  Reviews associated critical values for Overweight/Obesity, Hypertension, Cholesterol, and Diabetes.  Discusses basics of heart failure: signs/symptoms and treatments.  Introduces Heart Failure Zone chart for action plan for heart failure.  Written material given at graduation. Flowsheet Row Cardiac Rehab from 05/29/2022 in [facility] Cardiac and Pulmonary Rehab  Date 04/17/22  Educator SB  Instruction Review Code 1- Verbalizes Understanding       Infection Prevention: - Provides verbal and written material to individual with discussion of infection control including proper hand washing and proper equipment cleaning during exercise session. Flowsheet Row Cardiac Rehab from 05/29/2022 in [facility] Cardiac and Pulmonary Rehab  Date 03/25/22  Educator NT  Instruction Review Code 1- Verbalizes Understanding       Falls Prevention: - Provides verbal and written  material to individual with discussion of falls prevention and safety. Flowsheet Row Cardiac Rehab from 05/29/2022 in [facility] Cardiac and Pulmonary Rehab  Date 03/25/22  Educator NT  Instruction Review Code 1- Verbalizes Understanding       Other: -Provides group and verbal instruction on various topics (see comments)   Knowledge Questionnaire Score:  Knowledge Questionnaire Score - 05/27/22 0958       Knowledge Questionnaire Score   Post Score 24/26             Core Components/Risk Factors/Patient Goals at Admission:  Personal Goals and Risk Factors at Admission - 03/25/22 1523       Core Components/Risk Factors/Patient Goals on Admission    Weight Management Yes;Weight Loss    Intervention Weight Management: Develop a combined nutrition and exercise program designed to reach desired caloric intake, while maintaining appropriate intake of nutrient and fiber, sodium and fats, and appropriate energy expenditure  required for the weight goal.;Weight Management: Provide education and appropriate resources to help participant work on and attain dietary goals.;Weight Management/Obesity: Establish reasonable short term and long term weight goals.    Admit Weight 181 lb (82.1 kg)    Goal Weight: Short Term 176 lb (79.8 kg)    Goal Weight: Long Term 170 lb (77.1 kg)    Expected Outcomes Short Term: Continue to assess and modify interventions until short term weight is achieved;Long Term: Adherence to nutrition and physical activity/exercise program aimed toward attainment of established weight goal;Weight Loss: Understanding of general recommendations for a balanced deficit meal plan, which promotes 1-2 lb weight loss per week and includes a negative energy balance of [phone] kcal/d;Understanding recommendations for meals to include 15-35% energy as protein, 25-35% energy from fat, 35-60% energy from carbohydrates, less than 276m of dietary cholesterol, 20-35 gm of total fiber daily;Understanding of distribution of calorie intake throughout the day with the consumption of 4-5 meals/snacks    Diabetes Yes   Diet Controlled   Hypertension Yes    Intervention Provide education on lifestyle modifcations including regular physical activity/exercise, weight management, moderate sodium restriction and increased consumption of fresh fruit, vegetables, and low fat dairy, alcohol moderation, and smoking cessation.;Monitor prescription use compliance.    Expected Outcomes Short Term: Continued assessment and intervention until BP is < 140/920mHG in hypertensive participants. < 130/803mG in hypertensive participants with diabetes, heart failure or chronic kidney disease.;Long Term: Maintenance of blood pressure at goal levels.    Lipids Yes    Intervention Provide education and support for participant on nutrition & aerobic/resistive exercise along with prescribed medications to achieve LDL <71m43mDL >40mg12m Expected  Outcomes Short Term: Participant states understanding of desired cholesterol values and is compliant with medications prescribed. Participant is following exercise prescription and nutrition guidelines.;Long Term: Cholesterol controlled with medications as prescribed, with individualized exercise RX and with personalized nutrition plan. Value goals: LDL < 71mg,9m > 40 mg.             Education:Diabetes - Individual verbal and written instruction to review signs/symptoms of diabetes, desired ranges of glucose level fasting, after meals and with exercise. Acknowledge that pre and post exercise glucose checks will be done for 3 sessions at entry of program.   Core Components/Risk Factors/Patient Goals Review:   Goals and Risk Factor Review     Row Name 04/15/22 0947 05/24/22 1030           Core Components/Risk Factors/Patient Goals Review   Personal Goals Review  Weight Management/Obesity;Diabetes;Hypertension;Lipids Weight Management/Obesity;Diabetes;Hypertension;Lipids      Review Madesyn reports that her weight has been steady. She does take all medications as prescribed. She is not on any diabetes medications and does not check her blood sugar at home. She does follow up with her doctor and monitor to monitor her blood sugars. Tila does have a blood pressure cuff at home but does not check it regularly since she comes to rehab and gets it checked often. Meghna has done well in rehab.  Her weight is steady and pressures have been good.  Her hip still limits her activity but she does her best to manage. Her sugars are doing well.      Expected Outcomes Short: continue to attend rehab with regular attendance. Long: continue to work on weight loss goal. Continue to monitor risk factors               Core Components/Risk Factors/Patient Goals at Discharge (Final Review):   Goals and Risk Factor Review - 05/24/22 1030       Core Components/Risk Factors/Patient Goals Review    Personal Goals Review Weight Management/Obesity;Diabetes;Hypertension;Lipids    Review Ryenn has done well in rehab.  Her weight is steady and pressures have been good.  Her hip still limits her activity but she does her best to manage. Her sugars are doing well.    Expected Outcomes Continue to monitor risk factors             ITP Comments:  ITP Comments     Row Name 03/12/22 1344 03/25/22 1515 04/01/22 0941 04/10/22 0706 05/06/22 1145   ITP Comments Initial telephone completed. Diagnosis can be found in [facility] 8/7. EP orientation scheduled for Monday 9/18 at 1:30 Completed 6MWT and gym orientation. Initial ITP created and sent for review to Dr. Emily Filbert, Medical Director. First full day of exercise!  Patient was oriented to gym and equipment including functions, settings, policies, and procedures.  Patient's individual exercise prescription and treatment plan were reviewed.  All starting workloads were established based on the results of the 6 minute walk test done at initial orientation visit.  The plan for exercise progression was also introduced and progression will be customized based on patient's performance and goals. 30 Day review completed. Medical Director ITP review done, changes made as directed, and signed approval by Medical Director.    New to program Completed initial RD consultation    Row Name 05/08/22 0922 06/03/22 1033         ITP Comments 30 Day review completed. Medical Director ITP review done, changes made as directed, and signed approval by Medical Director. Maryjo graduated today from  rehab with 36 sessions completed.  Details of the patient's exercise prescription and what She needs to do in order to continue the prescription and progress were discussed with patient.  Patient was given a copy of prescription and goals.  Patient verbalized understanding.  Khai plans to continue to exercise by using aerobic machines and walking.                Comments: Discharge ITP

## 2022-06-03 NOTE — Progress Notes (Signed)
Discharge Summary: Teresa Marquez (DOB: 12/09/1940)   Teresa Marquez graduated today from  rehab with 36 sessions completed.  Details of the patient's exercise prescription and what Teresa Marquez needs to do in order to continue the prescription and progress were discussed with patient.  Patient was given a copy of prescription and goals.  Patient verbalized understanding.  Teresa Marquez plans to continue to exercise by using aerobic machines and walking.   Teresa Marquez Name 03/25/22 1525 05/22/22 1254       6 Minute Walk   Phase Initial Discharge    Distance 1130 feet 1400 feet    Distance % Change -- 25 %    Distance Feet Change -- 270 ft    Walk Time 6 minutes 6 minutes    # of Rest Breaks 0 0    MPH 2.12 2.65    METS 1.74 2.32    RPE 11 13    Perceived Dyspnea  1 0    VO2 Peak 6.09 8.11    Symptoms Yes (comment) Yes (comment)    Comments Hip tightness Hip/Back Pain 6/10    Resting HR 58 bpm 63 bpm    Resting BP 122/62 124/62    Resting Oxygen Saturation  97 % 98 %    Exercise Oxygen Saturation  during 6 min walk 96 % 99 %    Max Ex. HR 91 bpm 95 bpm    Max Ex. BP 146/70 156/74    2 Minute Post BP 132/64 --

## 2022-06-04 ENCOUNTER — Ambulatory Visit: Payer: Medicare Other | Attending: Cardiology | Admitting: Cardiology

## 2022-06-04 ENCOUNTER — Encounter: Payer: Self-pay | Admitting: Cardiology

## 2022-06-04 VITALS — BP 124/70 | HR 74 | Ht 63.0 in | Wt 184.6 lb

## 2022-06-04 DIAGNOSIS — Z789 Other specified health status: Secondary | ICD-10-CM | POA: Insufficient documentation

## 2022-06-04 DIAGNOSIS — Z79899 Other long term (current) drug therapy: Secondary | ICD-10-CM | POA: Insufficient documentation

## 2022-06-04 DIAGNOSIS — E782 Mixed hyperlipidemia: Secondary | ICD-10-CM | POA: Diagnosis not present

## 2022-06-04 DIAGNOSIS — Z951 Presence of aortocoronary bypass graft: Secondary | ICD-10-CM | POA: Insufficient documentation

## 2022-06-04 DIAGNOSIS — E785 Hyperlipidemia, unspecified: Secondary | ICD-10-CM | POA: Insufficient documentation

## 2022-06-04 DIAGNOSIS — E119 Type 2 diabetes mellitus without complications: Secondary | ICD-10-CM | POA: Insufficient documentation

## 2022-06-04 DIAGNOSIS — I2510 Atherosclerotic heart disease of native coronary artery without angina pectoris: Secondary | ICD-10-CM | POA: Insufficient documentation

## 2022-06-04 DIAGNOSIS — I10 Essential (primary) hypertension: Secondary | ICD-10-CM | POA: Insufficient documentation

## 2022-06-04 MED ORDER — METOPROLOL SUCCINATE ER 25 MG PO TB24: 12.5000 mg | ORAL_TABLET | Freq: Every day | ORAL | 2 refills | Status: DC

## 2022-06-04 MED ORDER — ATORVASTATIN CALCIUM 40 MG PO TABS: 40.0000 mg | ORAL_TABLET | Freq: Every day | ORAL | 2 refills | Status: DC

## 2022-06-04 MED ORDER — EZETIMIBE 10 MG PO TABS: 10.0000 mg | ORAL_TABLET | Freq: Every day | ORAL | 2 refills | Status: DC

## 2022-06-04 NOTE — Progress Notes (Signed)
Cardiology Office Note:    Date:  06/04/2022   ID:  Teresa Marquez, DOB 1940/12/14, MRN [id number]  PCP:  Merrilee Seashore, MD   [facility] Providers Cardiologist:  Freada Bergeron, MD {  Referring MD: Merrilee Seashore, MD    History of Present Illness:    Teresa Marquez is a 81 y.o. female with a hx of critical prox LAD disease s/p LIMA-LAD, DMII, HTN, HLD, and GERD who presents to clinic for follow-up.  Patient was initially referred to clinic for chest pain and DOE.  Coronary CTA revealed subtotal occlusion of proximal mid LAD with recommendation of cardiac catheterization for further evaluation.   She was seen by Ambrose Pancoast on 02/11/22 for pre-cath visit and was having chest pain and dynamic ECG changes at that time prompting referral for urgent cath. Cath confirmed critical ostial LAD disease prompting LIMA-LAD. She did well post-op. TTE with LVEF 60-65%, apicolateral hypokinesis, G1DD, normal RV, no valve disease.   Was last seen in follow-up by Nicholes Rough on 03/07/22 where she was doing well form a CV standpoint. Was working with cardiac rehab.  Today, the patient states that she is feeling alright aside from chest discomfort and soreness along her incisional site. Symptoms worsen with certain fabrics of her clothing. No rash or incision site redness.   Otherwise, she states she had a persistent cough for several weeks following her hospitalization. She has completed a z-pack and steroids and symptoms are better. She wonders if that prolonged coughing made her incision site pain worse.   Lately she feels more physically limited by her left hip pain. Additionally she complains of some myalgias due to her statin therapy. She is requesting to decrease the dose.  She also presents recent lab results which were personally reviewed. As of 03/2022: LDL 48, Triglycerides 77, A1c 6.3.  She denies any palpitations, shortness of breath, or peripheral edema. No  lightheadedness, headaches, syncope, orthopnea, or PND.  Past Medical History:  Diagnosis Date   Abdominal pain    Anxiety    Basal cell carcinoma    DDD (degenerative disc disease), lumbar    Diabetes mellitus without complication (HCC)    DM (diabetes mellitus) ([place])    Drug-induced myopathy    Elevated blood pressure reading in office without diagnosis of hypertension    GERD (gastroesophageal reflux disease)    Hyperlipidemia    Menopause    NAFLD (nonalcoholic fatty liver disease)    OSA (obstructive sleep apnea)     Past Surgical History:  Procedure Laterality Date   BREAST EXCISIONAL BIOPSY Left    benign   CARPAL TUNNEL RELEASE     CORONARY ARTERY BYPASS GRAFT N/A 02/13/2022   Procedure: CORONARY ARTERY BYPASS GRAFTING (CABG) TIMES ONE USING LIMA.;  Surgeon: Melrose Nakayama, MD;  Location: [facility];  Service: Open Heart Surgery;  Laterality: N/A;   FOOT SURGERY     LEFT HEART CATH AND CORONARY ANGIOGRAPHY N/A 02/11/2022   Procedure: LEFT HEART CATH AND CORONARY ANGIOGRAPHY;  Surgeon: Sherren Mocha, MD;  Location: [facility];  Service: Cardiovascular;  Laterality: N/A;   NASAL SINUS SURGERY     pituitary adenoma resection     SHOULDER SURGERY     TEE WITHOUT CARDIOVERSION N/A 02/13/2022   Procedure: TRANSESOPHAGEAL ECHOCARDIOGRAM (TEE);  Surgeon: Melrose Nakayama, MD;  Location: [facility];  Service: Open Heart Surgery;  Laterality: N/A;   TOTAL HIP ARTHROPLASTY     TUBAL LIGATION  Current Medications: Current Meds  Medication Sig   acetaminophen (TYLENOL) 500 MG tablet Take 500 mg by mouth every 6 (six) hours as needed for moderate pain.   aspirin 81 MG chewable tablet Chew 81 mg by mouth daily.   fluticasone (FLONASE) 50 MCG/ACT nasal spray Place 2 sprays into both nostrils every evening.   folic acid (FOLVITE) 884 MCG tablet Take 400 mcg daily by mouth.   MISC NATURAL PRODUCTS PO Take 1 Application by mouth daily. Milk Thistle   Multiple  Vitamins-Minerals (HAIR SKIN & NAILS ADVANCED PO) Take 1 tablet by mouth daily.   omeprazole (PRILOSEC) 20 MG capsule Take 10 mg by mouth every evening.   Polyvinyl Alcohol-Povidone (REFRESH OP) Place 1 drop into both eyes daily.   Pseudoeph-Bromphen-DM (BROMFED DM PO) 5 ml as needed   Turmeric 500 MG CAPS Take 2 capsules by mouth daily.   vitamin B-12 (CYANOCOBALAMIN) 1000 MCG tablet Take 1,000 mcg daily by mouth.   [DISCONTINUED] amiodarone (PACERONE) 200 MG tablet Take 1 tablet (200 mg total) by mouth 2 (two) times daily. X 7 days, then decrease to 200 mg daily   [DISCONTINUED] atorvastatin (LIPITOR) 40 MG tablet Take 1 tablet (40 mg total) by mouth daily.   [DISCONTINUED] atorvastatin (LIPITOR) 80 MG tablet Take 1 tablet (80 mg total) by mouth daily.   [DISCONTINUED] ezetimibe (ZETIA) 10 MG tablet Take 10 mg by mouth daily.   [DISCONTINUED] metoprolol succinate (TOPROL XL) 25 MG 24 hr tablet Take 0.5 tablets (12.5 mg total) by mouth daily.   [DISCONTINUED] metoprolol tartrate (LOPRESSOR) 25 MG tablet Take 0.5 tablets (12.5 mg total) by mouth 2 (two) times daily.     Allergies:   Bactrim [sulfamethoxazole-trimethoprim], Keflet [cephalexin], Statins, and Tetracyclines & related   Social History   Socioeconomic History   Marital status: Widowed    Spouse name: Not on file   Number of children: Not on file   Years of education: Not on file   Highest education level: Not on file  Occupational History   Not on file  Tobacco Use   Smoking status: Never   Smokeless tobacco: Never  Vaping Use   Vaping Use: Never used  Substance and Sexual Activity   Alcohol use: Yes   Drug use: Never   Sexual activity: Not on file  Other Topics Concern   Not on file  Social History Narrative   Not on file   Social Determinants of Health   Financial Resource Strain: Not on file  Food Insecurity: Not on file  Transportation Needs: Not on file  Physical Activity: Not on file  Stress: Not on file   Social Connections: Not on file     Family History: The patient's family history includes Cancer in her father and mother.  ROS:   Review of Systems  Constitutional:  Positive for diaphoresis. Negative for chills and fever.  HENT:  Positive for sore throat. Negative for nosebleeds and tinnitus.   Eyes:  Negative for blurred vision and pain.  Respiratory:  Positive for cough. Negative for hemoptysis, shortness of breath and stridor.   Cardiovascular:  Positive for chest pain (Soreness secondary to incision site). Negative for palpitations, orthopnea, claudication, leg swelling and PND.  Gastrointestinal:  Negative for blood in stool, diarrhea, nausea and vomiting.  Genitourinary:  Negative for dysuria and hematuria.  Musculoskeletal:  Positive for joint pain (Left hip) and myalgias. Negative for falls.  Neurological:  Negative for dizziness, loss of consciousness and headaches.  Psychiatric/Behavioral:  Negative for depression, hallucinations and substance abuse. The patient does not have insomnia.      EKGs/Labs/Other Studies Reviewed:    The following studies were reviewed today:  TTE 02/2022: IMPRESSIONS    1. There is a very small area of apicolateral hypokinesis. Left  ventricular ejection fraction, by estimation, is 60 to 65%. The left  ventricle has normal function. The left ventricle demonstrates regional  wall motion abnormalities (see scoring  diagram/findings for description). There is mild concentric left  ventricular hypertrophy. Left ventricular diastolic parameters are  consistent with Grade I diastolic dysfunction (impaired relaxation).  Elevated left atrial pressure.   2. Right ventricular systolic function is normal. The right ventricular  size is normal. Tricuspid regurgitation signal is inadequate for assessing  PA pressure.   3. The mitral valve is normal in structure. No evidence of mitral valve  regurgitation. No evidence of mitral stenosis.   4. The  aortic valve is normal in structure. Aortic valve regurgitation is  not visualized. No aortic stenosis is present.   5. The inferior vena cava is dilated in size with >50% respiratory  variability, suggesting right atrial pressure of 8 mmHg.    Cardiac catheterization 02/11/2022   1.  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  Patent left main with mild distal left main stenosis 3.  Patent left circumflex with mild diffuse nonobstructive disease 4.  Patent RCA (large, dominant vessel) with mild diffuse plaquing but no areas of high-grade stenosis 5.  Mild hypokinesis of the distal anterior wall with preserved overall LVEF estimated at 55 to 65%   Recommend: Resume heparin 2 hours after TR band off, TCTS consultation for consideration of CABG   Left Main  Dist LM lesion is 30% stenosed. There is mild tapering of the distal left main stem with 30% stenosis present    Left Anterior Descending  The LAD has subtotal proximal/ostial stenosis. The vessel tapers at the junction of the proximal to mid LAD as it is somewhat ectatic through the proximal segment. The mid and distal LAD have no significant stenosis. The LAD terminates at the apex. The first diagonal branch is patent with no significant stenosis.  Ost LAD to Prox LAD lesion is 99% stenosed. The ostium of the LAD demonstrates subtotal stenosis with TIMI-3 flow but high-grade 99% stenosis is present back to the true ostium of the LAD.    Left Circumflex  The vessel exhibits minimal luminal irregularities. The circumflex is patent with mild irregularity in the proximal vessel, mild irregularity in the mid vessel, and no significant stenosis.    Right Coronary Artery  Vessel is large. There is mild diffuse disease throughout the vessel. The RCA is a large, dominant vessel. There is diffuse irregularity throughout the proximal, mid, and distal vessel with mild ectasia and no areas of high-grade stenosis. The PDA and PLA  branches are patent with no significant stenoses.  Prox RCA lesion is 30% stenosed.  Dist RCA lesion is 40% stenosed.    Right Posterior Descending Artery  RPDA lesion is 30% stenosed.    Intervention    No interventions have been documented.    Wall Motion                  Left Heart   Left Ventricle The left ventricular size is normal. The left ventricular systolic function is normal. LV end diastolic pressure is normal. The left ventricular ejection fraction is 55-65% by visual estimate. There are  LV function abnormalities due to segmental dysfunction.    Coronary Diagrams   Diagnostic Dominance: Right       EKG:  EKG is personally reviewed. 06/04/2022:  EKG was not ordered. 01/11/2022:  NSR with HR 82  Recent Labs: 02/14/2022: Magnesium 2.4 02/16/2022: BUN 14; Creatinine, Ser 0.76; Hemoglobin 9.6; Platelets 175; Potassium 4.3; Sodium 132   Recent Lipid Panel    Component Value Date/Time   CHOL 174 02/12/2022 0808   TRIG 160 (H) 02/12/2022 0808   HDL 36 (L) 02/12/2022 0808   CHOLHDL 4.8 02/12/2022 0808   VLDL 32 02/12/2022 0808   LDLCALC 106 (H) 02/12/2022 0808     Risk Assessment/Calculations:           Physical Exam:    VS:  BP 124/70   Pulse 74   Ht '5\' 3"'$  (1.6 m)   Wt 184 lb 9.6 oz (83.7 kg)   SpO2 96%   BMI 32.70 kg/m     Wt Readings from Last 3 Encounters:  06/04/22 184 lb 9.6 oz (83.7 kg)  05/22/22 183 lb 11.2 oz (83.3 kg)  03/25/22 181 lb (82.1 kg)     GEN:  Well nourished, well developed in no acute distress HEENT: Normal NECK: No JVD; No carotid bruits CARDIAC: RRR, 2/6 systolic murmur RUSB RESPIRATORY:  Clear to auscultation without rales, wheezing or rhonchi  ABDOMEN: Soft, non-tender, non-distended MUSCULOSKELETAL:  No edema; No deformity  SKIN: Warm and dry NEUROLOGIC:  Alert and oriented x 3 PSYCHIATRIC:  Normal affect   ASSESSMENT:    1. Coronary artery disease involving native coronary artery of native heart without  angina pectoris   2. Medication management   3. S/P CABG x 1   4. Hyperlipidemia LDL goal <70   5. Mixed hyperlipidemia   6. Statin intolerance   7. Primary hypertension   8. Diabetes mellitus with coincident hypertension (HCC)     PLAN:    In order of problems listed above:  #Critical Ostial LAD Disease s/p LIMA to LAD Noted to have subtotal ostial LAD disease on coronary CTA that was confirmed on cath on 02/2022. Now s/p LIMA to LAD. Currently doing well without anginal symptoms. - Now s/p CABG x1 with LIMA-LAD - Change metop to 12.'5mg'$  XL daily - Stop amiodarone  - Continue ASA '81mg'$  daily - Decrease lipitor to '40mg'$  daily and continue zetia '10mg'$  daily   #HLD: #Statin Intolerance: Currently having myalgias with liptior. Will decrease dosing. Hesitant to start PCSK9i. -Decrease lipitor to '40mg'$  daily -Continue zetia '10mg'$  daily -Repeat lipids in 3 months -Goal LDL<70  #HTN: Well controlled. -Change to metop succinate 12.'5mg'$  XL daily  #DMII: Diet controlled and followed by PCP. Last A1C 7.2 -Management per PCP    Follow-up:  6 months.   Medication Adjustments/Labs and Tests Ordered: Current medicines are reviewed at length with the patient today.  Concerns regarding medicines are outlined above.   Orders Placed This Encounter  Procedures   Lipid Profile   Meds ordered this encounter  Medications   DISCONTD: ezetimibe (ZETIA) 10 MG tablet    Sig: Take 1 tablet (10 mg total) by mouth daily.    Dispense:  90 tablet    Refill:  2   DISCONTD: atorvastatin (LIPITOR) 40 MG tablet    Sig: Take 1 tablet (40 mg total) by mouth daily.    Dispense:  90 tablet    Refill:  2    decreased   DISCONTD: metoprolol succinate (TOPROL XL) 25  MG 24 hr tablet    Sig: Take 0.5 tablets (12.5 mg total) by mouth daily.    Dispense:  45 tablet    Refill:  2   metoprolol succinate (TOPROL XL) 25 MG 24 hr tablet    Sig: Take 0.5 tablets (12.5 mg total) by mouth daily.    Dispense:  45  tablet    Refill:  2   ezetimibe (ZETIA) 10 MG tablet    Sig: Take 1 tablet (10 mg total) by mouth daily.    Dispense:  90 tablet    Refill:  2   atorvastatin (LIPITOR) 40 MG tablet    Sig: Take 1 tablet (40 mg total) by mouth daily.    Dispense:  90 tablet    Refill:  2    decreased   Patient Instructions  Medication Instructions:   STOP TAKING AMIODARONE NOW  STOP TAKING METOPROLOL TARTRATE (LOPRESSOR) NOW  START TAKING METOPROLOL SUCCINATE (TOPROL XL) 12.5 MG BY MOUTH DAILY  DECREASE YOUR ATORVASTATIN (LIPITOR) TO 40 MG BY MOUTH DAILY  *If you need a refill on your cardiac medications before your next appointment, please call your pharmacy*   Lab Work:  IN 3 MONTHS HERE IN THE OFFICE--CHECK LIPIDS--PLEASE COME FASTING   If you have labs (blood work) drawn today and your tests are completely normal, you will receive your results only by: [place] (if you have MyChart) OR A paper copy in the mail If you have any lab test that is abnormal or we need to change your treatment, we will call you to review the results.   Follow-Up: At [facility], you and your health needs are our priority.  As part of our continuing mission to provide you with exceptional heart care, we have created designated Provider Care Teams.  These Care Teams include your primary Cardiologist (physician) and Advanced Practice Providers (APPs -  Physician Assistants and Nurse Practitioners) who all work together to provide you with the care you need, when you need it.  We recommend signing up for the patient portal called "MyChart".  Sign up information is provided on this After Visit Summary.  MyChart is used to connect with patients for Virtual Visits (Telemedicine).  Patients are able to view lab/test results, encounter notes, upcoming appointments, etc.  Non-urgent messages can be sent to your provider as well.   To learn more about what you can do with MyChart, go to  [email or web address].    Your next appointment:   6 month(s)  The format for your next appointment:   In Person  Provider:   Freada Bergeron, MD      Important Information About Sugar         I,Mathew Stumpf,acting as a scribe for Freada Bergeron, MD.,have documented all relevant documentation on the behalf of Freada Bergeron, MD,as directed by  Freada Bergeron, MD while in the presence of Freada Bergeron, MD.  I, Freada Bergeron, MD, have reviewed all documentation for this visit. The documentation on 06/04/22 for the exam, diagnosis, procedures, and orders are all accurate and complete.   Signed, Freada Bergeron, MD  06/04/2022 12:36 PM    [place]

## 2022-06-04 NOTE — Patient Instructions (Signed)
Medication Instructions:   STOP TAKING AMIODARONE NOW  STOP TAKING METOPROLOL TARTRATE (LOPRESSOR) NOW  START TAKING METOPROLOL SUCCINATE (TOPROL XL) 12.5 MG BY MOUTH DAILY  DECREASE YOUR ATORVASTATIN (LIPITOR) TO 40 MG BY MOUTH DAILY  *If you need a refill on your cardiac medications before your next appointment, please call your pharmacy*   Lab Work:  IN 3 MONTHS HERE IN THE OFFICE--CHECK LIPIDS--PLEASE COME FASTING   If you have labs (blood work) drawn today and your tests are completely normal, you will receive your results only by: MyChart Message (if you have MyChart) OR A paper copy in the mail If you have any lab test that is abnormal or we need to change your treatment, we will call you to review the results.   Follow-Up: At [facility], you and your health needs are our priority.  As part of our continuing mission to provide you with exceptional heart care, we have created designated Provider Care Teams.  These Care Teams include your primary Cardiologist (physician) and Advanced Practice Providers (APPs -  Physician Assistants and Nurse Practitioners) who all work together to provide you with the care you need, when you need it.  We recommend signing up for the patient portal called "MyChart".  Sign up information is provided on this After Visit Summary.  MyChart is used to connect with patients for Virtual Visits (Telemedicine).  Patients are able to view lab/test results, encounter notes, upcoming appointments, etc.  Non-urgent messages can be sent to your provider as well.   To learn more about what you can do with MyChart, go to [email or web address].    Your next appointment:   6 month(s)  The format for your next appointment:   In Person  Provider:   Freada Bergeron, MD      Important Information About Sugar

## 2022-06-06 DIAGNOSIS — I25110 Atherosclerotic heart disease of native coronary artery with unstable angina pectoris: Secondary | ICD-10-CM | POA: Diagnosis not present

## 2022-06-06 DIAGNOSIS — I10 Essential (primary) hypertension: Secondary | ICD-10-CM | POA: Diagnosis not present

## 2022-06-06 DIAGNOSIS — E119 Type 2 diabetes mellitus without complications: Secondary | ICD-10-CM | POA: Diagnosis not present

## 2022-06-06 DIAGNOSIS — Z48812 Encounter for surgical aftercare following surgery on the circulatory system: Secondary | ICD-10-CM | POA: Diagnosis not present

## 2022-08-19 ENCOUNTER — Ambulatory Visit
Admission: RE | Admit: 2022-08-19 | Discharge: 2022-08-19 | Disposition: A | Discharge status: Home / Self Care | Payer: Medicare Other | Source: Ambulatory Visit | Attending: Internal Medicine | Admitting: Internal Medicine

## 2022-08-19 DIAGNOSIS — Z1231 Encounter for screening mammogram for malignant neoplasm of breast: Secondary | ICD-10-CM

## 2022-08-21 DIAGNOSIS — Z23 Encounter for immunization: Secondary | ICD-10-CM | POA: Diagnosis not present

## 2022-08-21 DIAGNOSIS — G4733 Obstructive sleep apnea (adult) (pediatric): Secondary | ICD-10-CM | POA: Diagnosis not present

## 2022-09-04 ENCOUNTER — Ambulatory Visit: Payer: Medicare Other | Attending: Cardiology

## 2022-09-04 DIAGNOSIS — Z789 Other specified health status: Secondary | ICD-10-CM

## 2022-09-04 DIAGNOSIS — Z951 Presence of aortocoronary bypass graft: Secondary | ICD-10-CM

## 2022-09-04 DIAGNOSIS — E785 Hyperlipidemia, unspecified: Secondary | ICD-10-CM

## 2022-09-04 DIAGNOSIS — I2510 Atherosclerotic heart disease of native coronary artery without angina pectoris: Secondary | ICD-10-CM | POA: Diagnosis not present

## 2022-09-04 DIAGNOSIS — E782 Mixed hyperlipidemia: Secondary | ICD-10-CM | POA: Diagnosis not present

## 2022-09-04 DIAGNOSIS — Z79899 Other long term (current) drug therapy: Secondary | ICD-10-CM

## 2022-09-05 DIAGNOSIS — E782 Mixed hyperlipidemia: Secondary | ICD-10-CM | POA: Diagnosis not present

## 2022-09-05 DIAGNOSIS — I2510 Atherosclerotic heart disease of native coronary artery without angina pectoris: Secondary | ICD-10-CM | POA: Diagnosis not present

## 2022-09-05 DIAGNOSIS — I700 Atherosclerosis of aorta: Secondary | ICD-10-CM | POA: Diagnosis not present

## 2022-09-05 DIAGNOSIS — E1165 Type 2 diabetes mellitus with hyperglycemia: Secondary | ICD-10-CM | POA: Diagnosis not present

## 2022-09-05 LAB — LIPID PANEL
Chol/HDL Ratio: 2.6 ratio (ref 0.0–4.4)
Cholesterol, Total: 118 mg/dL (ref 100–199)
HDL: 46 mg/dL (ref >39)
LDL Chol Calc (NIH): 52 mg/dL (ref 0–99)
Triglycerides: 109 mg/dL (ref 0–149)
VLDL Cholesterol Cal: 20 mg/dL (ref 5–40)

## 2022-09-24 DIAGNOSIS — N182 Chronic kidney disease, stage 2 (mild): Secondary | ICD-10-CM | POA: Diagnosis not present

## 2022-09-24 DIAGNOSIS — E1165 Type 2 diabetes mellitus with hyperglycemia: Secondary | ICD-10-CM | POA: Diagnosis not present

## 2022-09-24 DIAGNOSIS — E782 Mixed hyperlipidemia: Secondary | ICD-10-CM | POA: Diagnosis not present

## 2022-09-24 DIAGNOSIS — G720 Drug-induced myopathy: Secondary | ICD-10-CM | POA: Diagnosis not present

## 2022-09-24 DIAGNOSIS — K760 Fatty (change of) liver, not elsewhere classified: Secondary | ICD-10-CM | POA: Diagnosis not present

## 2022-10-01 DIAGNOSIS — I700 Atherosclerosis of aorta: Secondary | ICD-10-CM | POA: Diagnosis not present

## 2022-10-01 DIAGNOSIS — K760 Fatty (change of) liver, not elsewhere classified: Secondary | ICD-10-CM | POA: Diagnosis not present

## 2022-10-01 DIAGNOSIS — E1122 Type 2 diabetes mellitus with diabetic chronic kidney disease: Secondary | ICD-10-CM | POA: Diagnosis not present

## 2022-10-01 DIAGNOSIS — E782 Mixed hyperlipidemia: Secondary | ICD-10-CM | POA: Diagnosis not present

## 2022-10-01 DIAGNOSIS — N182 Chronic kidney disease, stage 2 (mild): Secondary | ICD-10-CM | POA: Diagnosis not present

## 2022-10-01 DIAGNOSIS — I2510 Atherosclerotic heart disease of native coronary artery without angina pectoris: Secondary | ICD-10-CM | POA: Diagnosis not present

## 2022-10-01 DIAGNOSIS — G720 Drug-induced myopathy: Secondary | ICD-10-CM | POA: Diagnosis not present

## 2022-10-28 DIAGNOSIS — H40023 Open angle with borderline findings, high risk, bilateral: Secondary | ICD-10-CM | POA: Diagnosis not present

## 2022-12-03 NOTE — Progress Notes (Signed)
Cardiology Office Note:    Date:  12/06/2022   ID:  Teresa Marquez, DOB 1940/08/09, MRN [id number]  PCP:  Georgianne Fick, MD   [facility] Providers Cardiologist:  Meriam Sprague, MD {  Referring MD: Georgianne Fick, MD    History of Present Illness:    Teresa Marquez is a 82 y.o. female with a hx of critical prox LAD disease s/p LIMA-LAD, DMII, HTN, HLD, and GERD who presents to clinic for follow-up.  Patient was initially referred to clinic for chest pain and DOE.  Coronary CTA revealed subtotal occlusion of proximal mid LAD with recommendation of cardiac catheterization for further evaluation.   She was seen by Robin Searing on 02/11/22 for pre-cath visit and was having chest pain and dynamic ECG changes at that time prompting referral for urgent cath. Cath confirmed critical ostial LAD disease prompting LIMA-LAD. She did well post-op. TTE with LVEF 60-65%, apicolateral hypokinesis, G1DD, normal RV, no valve disease.   Was last seen in follow-up on 05/2022 where she was doing well other than mild discomfort at her incisional site. We also decreased the dose of her statins due to significant myalgias.  Today, the patient overall feels okay. Has been struggling with left hip arthritis and is planned to see orthopedics next week. Also has some itching/burning at her sternotomy site. Otherwise, no chest pain, LE edema, orthopnea or PND. Has some fatigue with walking especially when carrying something, but she goes to the [facility] 3 days/week where she is doing cardio and strength without significant issue. Continues to have myalgias with statins and is willing to discuss PCSK9i further.  Blood pressure running 110-120s/60s.   Past Medical History:  Diagnosis Date   Abdominal pain    Anxiety    Basal cell carcinoma    DDD (degenerative disc disease), lumbar    Diabetes mellitus without complication (HCC)    DM (diabetes mellitus) (HCC)    Drug-induced myopathy     Elevated blood pressure reading in office without diagnosis of hypertension    GERD (gastroesophageal reflux disease)    Hyperlipidemia    Menopause    NAFLD (nonalcoholic fatty liver disease)    OSA (obstructive sleep apnea)     Past Surgical History:  Procedure Laterality Date   BREAST EXCISIONAL BIOPSY Left    benign   CARPAL TUNNEL RELEASE     CORONARY ARTERY BYPASS GRAFT N/A 02/13/2022   Procedure: CORONARY ARTERY BYPASS GRAFTING (CABG) TIMES ONE USING LIMA.;  Surgeon: Loreli Slot, MD;  Location: [facility] OR;  Service: Open Heart Surgery;  Laterality: N/A;   FOOT SURGERY     LEFT HEART CATH AND CORONARY ANGIOGRAPHY N/A 02/11/2022   Procedure: LEFT HEART CATH AND CORONARY ANGIOGRAPHY;  Surgeon: Tonny Bollman, MD;  Location: [facility] INVASIVE CV LAB;  Service: Cardiovascular;  Laterality: N/A;   NASAL SINUS SURGERY     pituitary adenoma resection     SHOULDER SURGERY     TEE WITHOUT CARDIOVERSION N/A 02/13/2022   Procedure: TRANSESOPHAGEAL ECHOCARDIOGRAM (TEE);  Surgeon: Loreli Slot, MD;  Location: [facility] OR;  Service: Open Heart Surgery;  Laterality: N/A;   TOTAL HIP ARTHROPLASTY     TUBAL LIGATION      Current Medications: Current Meds  Medication Sig   acetaminophen (TYLENOL) 500 MG tablet Take 500 mg by mouth every 6 (six) hours as needed for moderate pain.   aspirin 81 MG chewable tablet Chew 81 mg by mouth daily.   atorvastatin (LIPITOR) 40  MG tablet Take 1 tablet (40 mg total) by mouth daily.   ezetimibe (ZETIA) 10 MG tablet Take 1 tablet (10 mg total) by mouth daily.   fluticasone (FLONASE) 50 MCG/ACT nasal spray Place 2 sprays into both nostrils every evening.   folic acid (FOLVITE) 800 MCG tablet Take 400 mcg daily by mouth.   metoprolol succinate (TOPROL XL) 25 MG 24 hr tablet Take 0.5 tablets (12.5 mg total) by mouth daily.   MISC NATURAL PRODUCTS PO Take 1 Application by mouth daily. Milk Thistle   Multiple Vitamins-Minerals (HAIR SKIN & NAILS ADVANCED PO)  Take 1 tablet by mouth daily.   omeprazole (PRILOSEC) 20 MG capsule Take 10 mg by mouth every evening.   Polyvinyl Alcohol-Povidone (REFRESH OP) Place 1 drop into both eyes daily.   Pseudoeph-Bromphen-DM (BROMFED DM PO) 5 ml as needed   Turmeric 500 MG CAPS Take 2 capsules by mouth daily.     Allergies:   Bactrim [sulfamethoxazole-trimethoprim], Keflet [cephalexin], Statins, and Tetracyclines & related   Social History   Socioeconomic History   Marital status: Widowed    Spouse name: Not on file   Number of children: Not on file   Years of education: Not on file   Highest education level: Not on file  Occupational History   Not on file  Tobacco Use   Smoking status: Never   Smokeless tobacco: Never  Vaping Use   Vaping Use: Never used  Substance and Sexual Activity   Alcohol use: Yes   Drug use: Never   Sexual activity: Not on file  Other Topics Concern   Not on file  Social History Narrative   Not on file   Social Determinants of Health   Financial Resource Strain: Not on file  Food Insecurity: Not on file  Transportation Needs: Not on file  Physical Activity: Not on file  Stress: Not on file  Social Connections: Not on file     Family History: The patient's family history includes Cancer in her father and mother.  ROS:   As per HPI   EKGs/Labs/Other Studies Reviewed:    The following studies were reviewed today:  TTE 02/2022: IMPRESSIONS    1. There is a very small area of apicolateral hypokinesis. Left  ventricular ejection fraction, by estimation, is 60 to 65%. The left  ventricle has normal function. The left ventricle demonstrates regional  wall motion abnormalities (see scoring  diagram/findings for description). There is mild concentric left  ventricular hypertrophy. Left ventricular diastolic parameters are  consistent with Grade I diastolic dysfunction (impaired relaxation).  Elevated left atrial pressure.   2. Right ventricular systolic  function is normal. The right ventricular  size is normal. Tricuspid regurgitation signal is inadequate for assessing  PA pressure.   3. The mitral valve is normal in structure. No evidence of mitral valve  regurgitation. No evidence of mitral stenosis.   4. The aortic valve is normal in structure. Aortic valve regurgitation is  not visualized. No aortic stenosis is present.   5. The inferior vena cava is dilated in size with >50% respiratory  variability, suggesting right atrial pressure of 8 mmHg.    Cardiac catheterization 02/11/2022   1.  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  Patent left main with mild distal left main stenosis 3.  Patent left circumflex with mild diffuse nonobstructive disease 4.  Patent RCA (large, dominant vessel) with mild diffuse plaquing but no areas of high-grade stenosis 5.  Mild  hypokinesis of the distal anterior wall with preserved overall LVEF estimated at 55 to 65%   Recommend: Resume heparin 2 hours after TR band off, TCTS consultation for consideration of CABG   Left Main  Dist LM lesion is 30% stenosed. There is mild tapering of the distal left main stem with 30% stenosis present    Left Anterior Descending  The LAD has subtotal proximal/ostial stenosis. The vessel tapers at the junction of the proximal to mid LAD as it is somewhat ectatic through the proximal segment. The mid and distal LAD have no significant stenosis. The LAD terminates at the apex. The first diagonal branch is patent with no significant stenosis.  Ost LAD to Prox LAD lesion is 99% stenosed. The ostium of the LAD demonstrates subtotal stenosis with TIMI-3 flow but high-grade 99% stenosis is present back to the true ostium of the LAD.    Left Circumflex  The vessel exhibits minimal luminal irregularities. The circumflex is patent with mild irregularity in the proximal vessel, mild irregularity in the mid vessel, and no significant stenosis.    Right Coronary  Artery  Vessel is large. There is mild diffuse disease throughout the vessel. The RCA is a large, dominant vessel. There is diffuse irregularity throughout the proximal, mid, and distal vessel with mild ectasia and no areas of high-grade stenosis. The PDA and PLA branches are patent with no significant stenoses.  Prox RCA lesion is 30% stenosed.  Dist RCA lesion is 40% stenosed.    Right Posterior Descending Artery  RPDA lesion is 30% stenosed.    Intervention    No interventions have been documented.    Wall Motion                  Left Heart   Left Ventricle The left ventricular size is normal. The left ventricular systolic function is normal. LV end diastolic pressure is normal. The left ventricular ejection fraction is 55-65% by visual estimate. There are LV function abnormalities due to segmental dysfunction.    Coronary Diagrams   Diagnostic Dominance: Right       EKG:  EKG is personally reviewed. 06/04/2022:  EKG was not ordered. 01/11/2022:  NSR with HR 82  Recent Labs: 02/14/2022: Magnesium 2.4 02/16/2022: BUN 14; Creatinine, Ser 0.76; Hemoglobin 9.6; Platelets 175; Potassium 4.3; Sodium 132   Recent Lipid Panel    Component Value Date/Time   CHOL 118 09/04/2022 0731   TRIG 109 09/04/2022 0731   HDL 46 09/04/2022 0731   CHOLHDL 2.6 09/04/2022 0731   CHOLHDL 4.8 02/12/2022 0808   VLDL 32 02/12/2022 0808   LDLCALC 52 09/04/2022 0731     Risk Assessment/Calculations:           Physical Exam:    VS:  BP (!) 146/78   Pulse 82   Ht 5\' 3"  (1.6 m)   Wt 193 lb 6.4 oz (87.7 kg)   SpO2 98%   BMI 34.26 kg/m     Wt Readings from Last 3 Encounters:  12/06/22 193 lb 6.4 oz (87.7 kg)  06/04/22 184 lb 9.6 oz (83.7 kg)  05/22/22 183 lb 11.2 oz (83.3 kg)     GEN:  Well nourished, well developed in no acute distress HEENT: Normal NECK: No JVD; No carotid bruits CARDIAC: RRR, 2/6 systolic murmur RUSB RESPIRATORY:  Clear to auscultation without rales,  wheezing or rhonchi  ABDOMEN: Soft, non-tender, non-distended MUSCULOSKELETAL:  No edema; No deformity  SKIN: Warm and dry NEUROLOGIC:  Alert  and oriented x 3 PSYCHIATRIC:  Normal affect   ASSESSMENT:    1. Coronary artery disease involving native coronary artery of native heart without angina pectoris   2. S/P CABG x 1   3. Mixed hyperlipidemia   4. Statin intolerance   5. Primary hypertension   6. Diabetes mellitus with coincident hypertension (HCC)      PLAN:    In order of problems listed above:  #Critical Ostial LAD Disease s/p LIMA to LAD Noted to have subtotal ostial LAD disease on coronary CTA that was confirmed on cath on 02/2022. Now s/p LIMA to LAD. Currently doing well without anginal symptoms. - Now s/p CABG x1 with LIMA-LAD - Continue metop 12.5mg  XL daily - Continue ASA 81mg  daily - Will refer back to lipid clinic for PCSK9i due to myalgias with lipitor -Can cut back lipitor to 40mg  every other day -Continue zetia 10mg  daily   #HLD: #Statin Intolerance: Currently having myalgias with liptior. Will refer to cholesterol clinic for consideration of PCSK9i. -Refer back to lipid clinic for consideration of Leqvio -Can cut back lipitor to 40mg  every other day -Continue zetia 10mg  daily -Repeat lipids in 3 months -Goal LDL<70  #HTN: Well controlled. -Continue metop succinate 12.5mg  XL daily  #DMII: Diet controlled and followed by PCP. Last A1C 7.2 -Management per PCP    Follow-up:  6 months.   Medication Adjustments/Labs and Tests Ordered: Current medicines are reviewed at length with the patient today.  Concerns regarding medicines are outlined above.   No orders of the defined types were placed in this encounter.  No orders of the defined types were placed in this encounter.  Patient Instructions  Medication Instructions:   Your physician recommends that you continue on your current medications as directed. Please refer to the Current Medication  list given to you today.  *If you need a refill on your cardiac medications before your next appointment, please call your pharmacy*    Follow-Up:  1.) WITH OUR PHARMACIST IN LIPID CLINIC AT NEXT AVAILABLE APPOINTMENT--PATIENT IS ALREADY ESTABLISHED WITH THEM   2.) 6 MONTHS WITH DR. Verne Carrow       Signed, Meriam Sprague, MD  12/06/2022 11:20 AM    [facility]

## 2022-12-06 ENCOUNTER — Ambulatory Visit: Payer: Medicare Other | Attending: Cardiology | Admitting: Cardiology

## 2022-12-06 ENCOUNTER — Encounter: Payer: Self-pay | Admitting: Cardiology

## 2022-12-06 VITALS — BP 146/78 | HR 82 | Ht 63.0 in | Wt 193.4 lb

## 2022-12-06 DIAGNOSIS — Z951 Presence of aortocoronary bypass graft: Secondary | ICD-10-CM

## 2022-12-06 DIAGNOSIS — E119 Type 2 diabetes mellitus without complications: Secondary | ICD-10-CM

## 2022-12-06 DIAGNOSIS — E782 Mixed hyperlipidemia: Secondary | ICD-10-CM

## 2022-12-06 DIAGNOSIS — Z7985 Long-term (current) use of injectable non-insulin antidiabetic drugs: Secondary | ICD-10-CM | POA: Diagnosis not present

## 2022-12-06 DIAGNOSIS — I10 Essential (primary) hypertension: Secondary | ICD-10-CM | POA: Diagnosis not present

## 2022-12-06 DIAGNOSIS — E1165 Type 2 diabetes mellitus with hyperglycemia: Secondary | ICD-10-CM | POA: Diagnosis not present

## 2022-12-06 DIAGNOSIS — Z789 Other specified health status: Secondary | ICD-10-CM

## 2022-12-06 DIAGNOSIS — I2510 Atherosclerotic heart disease of native coronary artery without angina pectoris: Secondary | ICD-10-CM

## 2022-12-06 DIAGNOSIS — I700 Atherosclerosis of aorta: Secondary | ICD-10-CM | POA: Diagnosis not present

## 2022-12-06 DIAGNOSIS — Z794 Long term (current) use of insulin: Secondary | ICD-10-CM | POA: Diagnosis not present

## 2022-12-06 NOTE — Patient Instructions (Signed)
Medication Instructions:   Your physician recommends that you continue on your current medications as directed. Please refer to the Current Medication list given to you today.  *If you need a refill on your cardiac medications before your next appointment, please call your pharmacy*    Follow-Up:  1.) WITH OUR PHARMACIST IN LIPID CLINIC AT NEXT AVAILABLE APPOINTMENT--PATIENT IS ALREADY ESTABLISHED WITH THEM   2.) 6 MONTHS WITH DR. Verne Carrow

## 2022-12-09 ENCOUNTER — Other Ambulatory Visit (INDEPENDENT_AMBULATORY_CARE_PROVIDER_SITE_OTHER): Payer: Medicare Other

## 2022-12-09 ENCOUNTER — Ambulatory Visit (INDEPENDENT_AMBULATORY_CARE_PROVIDER_SITE_OTHER): Payer: Medicare Other | Admitting: Orthopaedic Surgery

## 2022-12-09 DIAGNOSIS — M25552 Pain in left hip: Secondary | ICD-10-CM

## 2022-12-09 DIAGNOSIS — M1612 Unilateral primary osteoarthritis, left hip: Secondary | ICD-10-CM | POA: Insufficient documentation

## 2022-12-09 NOTE — Progress Notes (Signed)
The patient is someone I am seeing for the first time.  This is for left hip pain has been getting worse for the last 6 months to a year.  She actually had to have coronary artery bypass graft surgery last year.  She is 82 years old.  She is not on blood thinning medication.  She is a diabetic and her last hemoglobin A1c she said was 7.  She is 82 years old.  The hip pain is starting to detrimentally affect her mobility, her quality of life and her actives daily living.  She did have her right hip replaced in 2013 by one of my colleagues in town.  I was able to review all of her medications within epic as well as her past medical history and current medication and medical issues.  She reports that at this point her hip pain is bad enough to consider a left hip replacement.  She has been referred to me by other friends who have had hip replacements by me.  She is non-smoker.  She does live alone.  On exam her right hip moves smoothly and fluidly.  The left hip has significant stiffness with decreased internal and external rotation and significant pain in the groin with rotation.  An AP pelvis and lateral of the left hip shows bone-on-bone wear with complete loss of the superior lateral joint space and peritracheal osteophytes.  There is also sclerotic changes.  There is a right hip replacement that appears well-seated.  I did have her lay down supine her leg lengths are equal.  We talked in length in detail about direct anterior hip replacement surgery.  I discussed the risks and benefits of the surgery and what to expect from an intraoperative and postoperative course.  She has our surgery scheduler's card and she can look at her schedule and will be in touch with [name].  All question concerns were answered and addressed.

## 2022-12-12 ENCOUNTER — Telehealth: Payer: Self-pay | Admitting: *Deleted

## 2022-12-12 NOTE — Telephone Encounter (Signed)
   Pre-operative Risk Assessment    Patient Name: Teresa Marquez  DOB: 06-Oct-1940 MRN: [id number]      Request for Surgical Clearance    Procedure:   TOTAL HIP ARTHROPLASTY  Date of Surgery:  Clearance TBD                                 Surgeon:  DR. Doneen Poisson Surgeon's Group or Practice Name:  [facility] AT [facility] Phone number:  [phone] ATTN: [facility] Fax number:  [phone]   Type of Clearance Requested:   - Medical ; ASA    Type of Anesthesia:  Spinal   Additional requests/questions:    [name]   12/12/2022, 1:00 PM

## 2022-12-12 NOTE — Telephone Encounter (Signed)
   Patient Name: Teresa Marquez  DOB: [birth year] MRN: [id number]  Primary Cardiologist: Meriam Sprague, MD  Chart reviewed as part of pre-operative protocol coverage. Pre-op clearance already addressed by colleagues in earlier phone notes. To summarize recommendations:  -I agree. She would be okay to go for hip surgery. Agree with continuing ASA if able. If not, okay to hold.  -Dr. Shari Prows  Will route this bundled recommendation to requesting provider via Epic fax function and remove from pre-op pool. Please call with questions.  Sharlene Dory, PA-C 12/12/2022, 4:00 PM

## 2022-12-17 DIAGNOSIS — D225 Melanocytic nevi of trunk: Secondary | ICD-10-CM | POA: Diagnosis not present

## 2022-12-17 DIAGNOSIS — X32XXXD Exposure to sunlight, subsequent encounter: Secondary | ICD-10-CM | POA: Diagnosis not present

## 2022-12-17 DIAGNOSIS — L570 Actinic keratosis: Secondary | ICD-10-CM | POA: Diagnosis not present

## 2022-12-17 DIAGNOSIS — Z1283 Encounter for screening for malignant neoplasm of skin: Secondary | ICD-10-CM | POA: Diagnosis not present

## 2023-01-01 ENCOUNTER — Ambulatory Visit: Payer: Medicare Other | Attending: Cardiology | Admitting: Pharmacist

## 2023-01-01 ENCOUNTER — Other Ambulatory Visit: Payer: Self-pay | Admitting: Pharmacist

## 2023-01-01 DIAGNOSIS — R350 Frequency of micturition: Secondary | ICD-10-CM | POA: Diagnosis not present

## 2023-01-01 DIAGNOSIS — E782 Mixed hyperlipidemia: Secondary | ICD-10-CM

## 2023-01-01 NOTE — Patient Instructions (Addendum)
Continue taking your atorvastatin every other day and your ezetimibe daily for your cholesterol until you hear from the infusion center to schedule your Leqvio injection  Once you start on Leqvio, you can stop your atorvastatin. Please continue your ezetimibe.  Wilber Bihari is a subcutaneous injection given at month 0 and 3, then every 6 months. It lowers your LDL cholesterol by about 50%. There are ongoing studies investigating long term cardiac benefit with the medication (ORION trials).   We'll recheck your labs a few weeks after your second Leqvio injection (likely the end of October)

## 2023-01-01 NOTE — Progress Notes (Signed)
Please call pt's home # and leave detailed message if she doesn't answer when scheduling first Leqvio injection, thanks!

## 2023-01-01 NOTE — Progress Notes (Signed)
Patient ID: ONELIA CADMUS                 DOB: 1940-11-15                    MRN: [id number]     HPI: MARGY SUMLER is a 82 y.o. female patient referred to lipid clinic by Dr Shari Prows. PMH is significant for critical prox LAD disease s/p LIMA-LAD, T2DM, HTN, HLD, and GERD. Initially referred to clinic for chest pain and DOE. Coronary CTA showed subtotal occlusion of prox mid LAD with recommendation for cath. She was prevoiusly seen in lipid clinic 01/2022 given intolerance to 3 statins. PCSK9i and Leqvio were both discussed, pt wanted to think about it. She was then seen for pre cath visit 02/11/22 and was experiencing chest pain and dynamic ECG changes prompting urgent cath which confirmed critical ostial LAD disease prompting LIMA-LAD. At MD follow up last month, pt reported myalgias on atorvastatin 40mg  daily. She was re-referred to lipid clinic and advised to change to every other day dosing in the mean time.  Pt presents today for follow up. Thinks she's tolerating every other day atorvastatin a bit better but still hurts all over. 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 Does not wish to stay on statin therapy. Also has had 2 boils on her back that she attributes to prior rosuvastatin use. Has arthritis, pending left hip replacement August 16. 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 [facility] Dealing with a UTI today, seeing her PCP after.   Current Medications: atorvastatin 40mg  every other day, ezetimibe 10mg  daily Intolerances: atorvastatin 40-80mg  daily - myalgias; rosuvastatin 20mg  daily - numbness in feet, weakness, joint pain; fluvastatin - numbness in feet Risk Factors: CAD, DM, age, HTN LDL goal: 55mg /dL  Diet:  Breakfast - cereal, eggs, fruit/yogurt, or english muffin Dinner - meat, 2 veggies Snacks - crackers, skinny popcorn, chips and salsa Drinks - diet peach tea or coke zero Limits eating out at [employer]  Exercise: [employer] 3 days a week  for cardio and strength training.  Family History: Father with CABG in late 12s. Mother with HLD.   Social History: No tobacco, alcohol or drug use.  Labs: 09/04/22: TC 118, TG 109, HDL 46, LDL 52 - atorvastatin 40mg  daily, ezetimibe 10mg  daily 09/19/21: TC 214 , TG 178, HDL 39, LDL 139 - ezetimibe 10mg  daily  Past Medical History:  Diagnosis Date   Abdominal pain    Anxiety    Basal cell carcinoma    DDD (degenerative disc disease), lumbar    Diabetes mellitus without complication (HCC)    DM (diabetes mellitus) (HCC)    Drug-induced myopathy    Elevated blood pressure reading in office without diagnosis of hypertension    GERD (gastroesophageal reflux disease)    Hyperlipidemia    Menopause    NAFLD (nonalcoholic fatty liver disease)    OSA (obstructive sleep apnea)     Current Outpatient Medications on File Prior to Visit  Medication Sig Dispense Refill   acetaminophen (TYLENOL) 500 MG tablet Take 500 mg by mouth every 6 (six) hours as needed for moderate pain.     aspirin 81 MG chewable tablet Chew 81 mg by mouth daily.     atorvastatin (LIPITOR) 40 MG tablet Take 1 tablet (40 mg total) by mouth daily. 90 tablet 2   ezetimibe (ZETIA) 10 MG tablet Take 1 tablet (10 mg total) by mouth daily. 90  tablet 2   fluticasone (FLONASE) 50 MCG/ACT nasal spray Place 2 sprays into both nostrils every evening.     folic acid (FOLVITE) 800 MCG tablet Take 400 mcg daily by mouth.     metoprolol succinate (TOPROL XL) 25 MG 24 hr tablet Take 0.5 tablets (12.5 mg total) by mouth daily. 45 tablet 2   MISC NATURAL PRODUCTS PO Take 1 Application by mouth daily. Milk Thistle     Multiple Vitamins-Minerals (HAIR SKIN & NAILS ADVANCED PO) Take 1 tablet by mouth daily.     omeprazole (PRILOSEC) 20 MG capsule Take 10 mg by mouth every evening.     Polyvinyl Alcohol-Povidone (REFRESH OP) Place 1 drop into both eyes daily.     Pseudoeph-Bromphen-DM (BROMFED DM PO) 5 ml as needed     Turmeric 500 MG CAPS  Take 2 capsules by mouth daily.     No current facility-administered medications on file prior to visit.    Allergies  Allergen Reactions   Bactrim [Sulfamethoxazole-Trimethoprim]     Heart racing   Keflet [Cephalexin]     Makes me bleed   Statins     Other reaction(s): worsening neuropathy   Tetracyclines & Related     Yeast infection     Assessment/Plan:  1. Hyperlipidemia - LDL previously 52 at goal < 55 on atorvastatin 40mg  daily and ezetimibe 10mg  daily. Still experiencing myalgias on atorvastatin every other day, previously experienced myalgias and neuropathy on rosuvastatin and fluvastatin. She wishes to stop statin therapy and start Leqvio. She will continue on ezetimibe 10mg  daily. Will place referral for Leqvio at [facility] infusion center. She has a Medicare supplement plan so rx should be free. She is ok staying on her atorvastatin for the next few weeks until she starts on Leqvio. Will plan to recheck lipids a few weeks after her 2nd dose this fall.  Caress Reffitt E. Itzae Miralles, PharmD, BCACP, CPP [facility] [address], [place], [place] [address] Phone: [phone]; Fax: [phone] 01/01/2023 10:43 AM

## 2023-01-02 ENCOUNTER — Telehealth: Payer: Self-pay | Admitting: Pharmacy Technician

## 2023-01-02 NOTE — Telephone Encounter (Signed)
Towana Badger note:  Patient will be scheduled as soon as possible.  Auth Submission: NO AUTH NEEDED Site of care: Site of care: [facility] Payer: medicare a/b & aarp Medication & CPT/J Code(s) submitted: Leqvio (Inclisiran) [phone] Route of submission (phone, fax, portal):  Phone # Fax # Auth type: Buy/Bill Units/visits requested: x3 doses Reference number:  Approval from: 01/02/23 to 01/02/24

## 2023-01-07 DIAGNOSIS — K219 Gastro-esophageal reflux disease without esophagitis: Secondary | ICD-10-CM | POA: Diagnosis not present

## 2023-01-07 DIAGNOSIS — H9113 Presbycusis, bilateral: Secondary | ICD-10-CM | POA: Diagnosis not present

## 2023-01-07 DIAGNOSIS — J310 Chronic rhinitis: Secondary | ICD-10-CM | POA: Diagnosis not present

## 2023-01-08 DIAGNOSIS — H90A21 Sensorineural hearing loss, unilateral, right ear, with restricted hearing on the contralateral side: Secondary | ICD-10-CM | POA: Diagnosis not present

## 2023-01-13 NOTE — Addendum Note (Signed)
Addended by: Merik Mignano E on: 01/13/2023 11:11 AM   Modules accepted: Orders

## 2023-01-16 ENCOUNTER — Ambulatory Visit: Payer: Medicare Other

## 2023-01-16 VITALS — BP 116/70 | HR 74 | Temp 98.8°F | Resp 18 | Ht 63.0 in | Wt 193.8 lb

## 2023-01-16 DIAGNOSIS — I25110 Atherosclerotic heart disease of native coronary artery with unstable angina pectoris: Secondary | ICD-10-CM

## 2023-01-16 DIAGNOSIS — E782 Mixed hyperlipidemia: Secondary | ICD-10-CM | POA: Diagnosis not present

## 2023-01-16 MED ORDER — INCLISIRAN SODIUM 284 MG/1.5ML ~~LOC~~ SOSY
284.0000 mg | PREFILLED_SYRINGE | Freq: Once | SUBCUTANEOUS | Status: AC
  Administered 2023-01-16: 284 mg via SUBCUTANEOUS
  Filled 2023-01-16: qty 1.5

## 2023-01-16 NOTE — Progress Notes (Signed)
Diagnosis: Hyperlipidemia  Provider:  Chilton Greathouse MD  Procedure: Injection  Leqvio (inclisiran), Dose: 284 mg, Site: subcutaneous, Number of injections: 1  Post Care: Observation period completed  Discharge: Condition: Good, Destination: Home . AVS Provided  Performed by:  Nat Math, RN

## 2023-01-16 NOTE — Patient Instructions (Signed)
Inclisiran Injection What is this medication? INCLISIRAN (in kli SIR an) treats high cholesterol. It works by decreasing bad cholesterol (such as LDL) in your blood. Changes to diet and exercise are often combined with this medication. This medicine may be used for other purposes; ask your health care provider or pharmacist if you have questions. COMMON BRAND NAME(S): LEQVIO What should I tell my care team before I take this medication? They need to know if you have any of these conditions: An unusual or allergic reaction to inclisiran, other medications, foods, dyes, or preservatives Pregnant or trying to get pregnant Breast-feeding How should I use this medication? This medication is injected under the skin. It is given by your care team in a hospital or clinic setting. Talk to your care team about the use of this medication in children. Special care may be needed. Overdosage: If you think you have taken too much of this medicine contact a poison control center or emergency room at once. NOTE: This medicine is only for you. Do not share this medicine with others. What if I miss a dose? Keep appointments for follow-up doses. It is important not to miss your dose. Call your care team if you are unable to keep an appointment. What may interact with this medication? Interactions are not expected. This list may not describe all possible interactions. Give your health care provider a list of all the medicines, herbs, non-prescription drugs, or dietary supplements you use. Also tell them if you smoke, drink alcohol, or use illegal drugs. Some items may interact with your medicine. What should I watch for while using this medication? Visit your care team for regular checks on your progress. Tell your care team if your symptoms do not start to get better or if they get worse. You may need blood work while you are taking this medication. What side effects may I notice from receiving this  medication? Side effects that you should report to your care team as soon as possible: Allergic reactions--skin rash, itching, hives, swelling of the face, lips, tongue, or throat Side effects that usually do not require medical attention (report these to your care team if they continue or are bothersome): Joint pain Pain, redness, or irritation at injection site This list may not describe all possible side effects. Call your doctor for medical advice about side effects. You may report side effects to FDA at 1-800-FDA-1088. Where should I keep my medication? This medication is given in a hospital or clinic. It will not be stored at home. NOTE: This sheet is a summary. It may not cover all possible information. If you have questions about this medicine, talk to your doctor, pharmacist, or health care provider.  2024 Elsevier/Gold Standard (2022-09-01 00:00:00)  

## 2023-01-22 DIAGNOSIS — G4733 Obstructive sleep apnea (adult) (pediatric): Secondary | ICD-10-CM | POA: Diagnosis not present

## 2023-02-06 ENCOUNTER — Other Ambulatory Visit: Payer: Self-pay

## 2023-02-07 NOTE — Progress Notes (Signed)
COVID Vaccine received:  []  No [x]  Yes Date of any COVID positive Test in last 90 days: No PCP - Georgianne Fick MD Cardiologist - Laurance Flatten MD  Chest x-ray - 03/19/22 Epic EKG - 02/10/23 EPIC  Stress Test -  ECHO - 02/12/22 Epic Cardiac Cath - 02/11/22 Epic  Bowel Prep - [x]  No  []   Yes ______  Pacemaker / ICD device [x]  No []  Yes   Spinal Cord Stimulator:[x]  No []  Yes       History of Sleep Apnea? []  No [x]  Yes   CPAP used?- []  No []  Yes    Does the patient monitor blood sugar?          [x]  No []  Yes  []  N/A  Patient has: []  NO Hx DM   No Pre-DM                 []  DM1  [x]   DM2 Does patient have a [employer] or Dexacom? [x]  No []  Yes   Fasting Blood Sugar Ranges-  Pt. Does not check Checks Blood Sugar __0___ times a day  GLP1 agonist / usual dose - No GLP1 instructions:  SGLT-2 inhibitors / usual dose - No SGLT-2 instructions:   Blood Thinner / Instructions:N/A Aspirin Instructions:ASA-  will continue to take  Comments:   Activity level: Patient is able  to climb a flight of stairs without difficulty; [x]  No CP  [x]  No SOB   Patient can  perform ADLs without assistance.   Anesthesia review: OSA, CAD, CABG DM2, HTN, Elevated HgbA1c and low H&H  Patient denies shortness of breath, fever, cough and chest pain at PAT appointment.  Patient verbalized understanding and agreement to the Pre-Surgical Instructions that were given to them at this PAT appointment. Patient was also educated of the need to review these PAT instructions again prior to his/her surgery.I reviewed the appropriate phone numbers to call if they have any and questions or concerns.

## 2023-02-07 NOTE — Patient Instructions (Signed)
SURGICAL WAITING ROOM VISITATION  Patients having surgery or a procedure may have no more than 2 support people in the waiting area - these visitors may rotate.    Children under the age of 2 must have an adult with them who is not the patient.  Due to an increase in RSV and influenza rates and associated hospitalizations, children ages 44 and under may not visit patients in [facility] hospitals.  If the patient needs to stay at the hospital during part of their recovery, the visitor guidelines for inpatient rooms apply. Pre-op nurse will coordinate an appropriate time for 1 support person to accompany patient in pre-op.  This support person may not rotate.    Please refer to the [facility] website for the visitor guidelines for Inpatients (after your surgery is over and you are in a regular room).       Your procedure is scheduled on: 02/21/23   Report to [facility] Main Entrance    Report to admitting at 8:15 AM   Call this number if you have problems the morning of surgery [phone]   Do not eat food :After Midnight.   After Midnight you may have the following liquids until 7:30 AM DAY OF SURGERY  Water Non-Citrus Juices (without pulp, NO RED-Apple, White grape, White cranberry) Black Coffee (NO MILK/CREAM OR CREAMERS, sugar ok)  Clear Tea (NO MILK/CREAM OR CREAMERS, sugar ok) regular and decaf                             Plain Jell-O (NO RED)                                           Fruit ices (not with fruit pulp, NO RED)                                     Popsicles (NO RED)                                                               Sports drinks like Gatorade (NO RED)                  The day of surgery:  Drink ONE (1) Pre-SurgeryG2 at 7:30 AM the morning of surgery. Drink in one sitting. Do not sip.  This drink was given to you during your hospital  pre-op appointment visit. Nothing else to drink after completing the  Pre-Surgery G2.      Oral  Hygiene is also important to reduce your risk of infection.                                    Remember - BRUSH YOUR TEETH THE MORNING OF SURGERY WITH YOUR REGULAR TOOTHPASTE  DENTURES WILL BE REMOVED PRIOR TO SURGERY PLEASE DO NOT APPLY "Poly grip" OR ADHESIVES!!!   Do NOT smoke after Midnight   Stop all vitamins and herbal supplements 7 days before surgery.  Take these medicines the morning of surgery : Tylenol if needed, Zetia, Metoprolol, Omeprazole  DO NOT TAKE ANY ORAL DIABETIC MEDICATIONS DAY OF YOUR SURGERY             You may not have any metal on your body including hair pins, jewelry, and body piercing             Do not wear make-up, lotions, powders, perfumes or deodorant  Do not wear nail polish including gel and S&S, artificial/acrylic nails, or any other type of covering on natural nails including finger and toenails. If you have artificial nails, gel coating, etc. that needs to be removed by a nail salon please have this removed prior to surgery or surgery may need to be canceled/ delayed if the surgeon/ anesthesia feels like they are unable to be safely monitored.   Do not shave  48 hours prior to surgery.  .   Do not bring valuables to the hospital. [facility] IS NOT             RESPONSIBLE   FOR VALUABLES.   Contacts, glasses, dentures or bridgework may not be worn into surgery.   Bring small overnight bag day of surgery.   DO NOT BRING YOUR HOME MEDICATIONS TO THE HOSPITAL. PHARMACY WILL DISPENSE MEDICATIONS LISTED ON YOUR MEDICATION LIST TO YOU DURING YOUR ADMISSION IN THE HOSPITAL!    Patients discharged on the day of surgery will not be allowed to drive home.  Someone NEEDS to stay with you for the first 24 hours after anesthesia.   Special Instructions: Bring a copy of your healthcare power of attorney and living will documents the day of surgery if you haven't scanned them before.              Please read over the following fact sheets you were given:  IF YOU HAVE QUESTIONS ABOUT YOUR PRE-OP INSTRUCTIONS PLEASE CALL [phone] [facility]   If you received a COVID test during your pre-op visit  it is requested that you wear a mask when out in public, stay away from anyone that may not be feeling well and notify your surgeon if you develop symptoms. If you test positive for Covid or have been in contact with anyone that has tested positive in the last 10 days please notify you surgeon.      Pre-operative 5 CHG Bath Instructions   You can play a key role in reducing the risk of infection after surgery. Your skin needs to be as free of germs as possible. You can reduce the number of germs on your skin by washing with CHG (chlorhexidine gluconate) soap before surgery. CHG is an antiseptic soap that kills germs and continues to kill germs even after washing.   DO NOT use if you have an allergy to chlorhexidine/CHG or antibacterial soaps. If your skin becomes reddened or irritated, stop using the CHG and notify one of our RNs at [phone].   Please shower with the CHG soap starting 4 days before surgery using the following schedule:     Please keep in mind the following:  DO NOT shave, including legs and underarms, starting the day of your first shower.   You may shave your face at any point before/day of surgery.  Place clean sheets on your bed the day you start using CHG soap. Use a clean washcloth (not used since being washed) for each shower. DO NOT sleep with pets once you start using the CHG.  CHG Shower Instructions:  If you choose to wash your hair and private area, wash first with your normal shampoo/soap.  After you use shampoo/soap, rinse your hair and body thoroughly to remove shampoo/soap residue.  Turn the water OFF and apply about 3 tablespoons (45 ml) of CHG soap to a CLEAN washcloth.  Apply CHG soap ONLY FROM YOUR NECK DOWN TO YOUR TOES (washing for 3-5 minutes)  DO NOT use CHG soap on face, private areas, open wounds, or  sores.  Pay special attention to the area where your surgery is being performed.  If you are having back surgery, having someone wash your back for you may be helpful. Wait 2 minutes after CHG soap is applied, then you may rinse off the CHG soap.  Pat dry with a clean towel  Put on clean clothes/pajamas   If you choose to wear lotion, please use ONLY the CHG-compatible lotions on the back of this paper.     Additional instructions for the day of surgery: DO NOT APPLY any lotions, deodorants, cologne, or perfumes.   Put on clean/comfortable clothes.  Brush your teeth.  Ask your nurse before applying any prescription medications to the skin.      CHG Compatible Lotions   Aveeno Moisturizing lotion  Cetaphil Moisturizing Cream  Cetaphil Moisturizing Lotion  Clairol Herbal Essence Moisturizing Lotion, Dry Skin  Clairol Herbal Essence Moisturizing Lotion, Extra Dry Skin  Clairol Herbal Essence Moisturizing Lotion, Normal Skin  Curel Age Defying Therapeutic Moisturizing Lotion with Alpha Hydroxy  Curel Extreme Care Body Lotion  Curel Soothing Hands Moisturizing Hand Lotion  Curel Therapeutic Moisturizing Cream, Fragrance-Free  Curel Therapeutic Moisturizing Lotion, Fragrance-Free  Curel Therapeutic Moisturizing Lotion, Original Formula  Eucerin Daily Replenishing Lotion  Eucerin Dry Skin Therapy Plus Alpha Hydroxy Crme  Eucerin Dry Skin Therapy Plus Alpha Hydroxy Lotion  Eucerin Original Crme  Eucerin Original Lotion  Eucerin Plus Crme Eucerin Plus Lotion  Eucerin TriLipid Replenishing Lotion  Keri Anti-Bacterial Hand Lotion  Keri Deep Conditioning Original Lotion Dry Skin Formula Softly Scented  Keri Deep Conditioning Original Lotion, Fragrance Free Sensitive Skin Formula  Keri Lotion Fast Absorbing Fragrance Free Sensitive Skin Formula  Keri Lotion Fast Absorbing Softly Scented Dry Skin Formula  Keri Original Lotion  Keri Skin Renewal Lotion Keri Silky Smooth Lotion  Keri  Silky Smooth Sensitive Skin Lotion  Nivea Body Creamy Conditioning Oil  Nivea Body Extra Enriched Lotion  Nivea Body Original Lotion  Nivea Body Sheer Moisturizing Lotion Nivea Crme  Nivea Skin Firming Lotion  NutraDerm 30 Skin Lotion  NutraDerm Skin Lotion  NutraDerm Therapeutic Skin Cream  NutraDerm Therapeutic Skin Lotion  ProShield Protective Hand Cream   How to Manage Your Diabetes Before and After Surgery  Why is it important to control my blood sugar before and after surgery? Improving blood sugar levels before and after surgery helps healing and can limit problems. A way of improving blood sugar control is eating a healthy diet by:  Eating less sugar and carbohydrates  Increasing activity/exercise  Talking with your doctor about reaching your blood sugar goals High blood sugars (greater than 180 mg/dL) can raise your risk of infections and slow your recovery, so you will need to focus on controlling your diabetes during the weeks before surgery. Make sure that the doctor who takes care of your diabetes knows about your planned surgery including the date and location.  How do I manage my blood sugar before surgery? Check your blood sugar at least 4  times a day, starting 2 days before surgery, to make sure that the level is not too high or low. Check your blood sugar the morning of your surgery when you wake up and every 2 hours until you get to the Short Stay unit. If your blood sugar is less than 70 mg/dL, you will need to treat for low blood sugar: Do not take insulin. Treat a low blood sugar (less than 70 mg/dL) with  cup of clear juice (cranberry or apple), 4 glucose tablets, OR glucose gel. Recheck blood sugar in 15 minutes after treatment (to make sure it is greater than 70 mg/dL). If your blood sugar is not greater than 70 mg/dL on recheck, call [phone] for further instructions. Report your blood sugar to the short stay nurse when you get to Short Stay.  If you  are admitted to the hospital after surgery: Your blood sugar will be checked by the staff and you will probably be given insulin after surgery (instead of oral diabetes medicines) to make sure you have good blood sugar levels. The goal for blood sugar control after surgery is 80-180 mg/dL.   WHAT DO I DO ABOUT MY DIABETES MEDICATION?  Do not take oral diabetes medicines (pills) the morning of surgery.  THE NIGHT BEFORE SURGERY, take     units of       insulin.       THE MORNING OF SURGERY, take   units of         insulin.  DO NOT TAKE THE FOLLOWING 7 DAYS PRIOR TO SURGERY: Ozempic, Wegovy, Rybelsus (Semaglutide), Byetta (exenatide), Bydureon (exenatide ER), Victoza, Saxenda (liraglutide), or Trulicity (dulaglutide) Mounjaro (Tirzepatide) Adlyxin (Lixisenatide), Polyethylene Glycol Loxenatide.  If your CBG is greater than 220 mg/dL, you may take  of your sliding scale  (correction) dose of insulin.    For patients with insulin pumps: Contact your diabetes doctor for specific instructions before surgery. Decrease basal rates by 20% at midnight the night before your surgery. Note that if your surgery is planned to be longer than 2 hours, your insulin pump will be removed and intravenous (IV) insulin will be started and managed by the nurses and the anesthesiologist. You will be able to restart your insulin pump once you are awake and able to manage it.  Make sure to bring insulin pump supplies to the hospital with you in case the  site needs to be changed.  Patient Signature:  Date:   Nurse Signature:  Date:   Facilities manager (Watch this video at home: [email or web address])  An incentive spirometer is a tool that can help keep your lungs clear and active. This tool measures how well you are filling your lungs with each breath. Taking long deep breaths may help reverse or decrease the chance of developing breathing (pulmonary) problems (especially infection)  following: A long period of time when you are unable to move or be active. BEFORE THE PROCEDURE  If the spirometer includes an indicator to show your best effort, your nurse or respiratory therapist will set it to a desired goal. If possible, sit up straight or lean slightly forward. Try not to slouch. Hold the incentive spirometer in an upright position. INSTRUCTIONS FOR USE  Sit on the edge of your bed if possible, or sit up as far as you can in bed or on a chair. Hold the incentive spirometer in an upright position. Breathe out normally. Place the mouthpiece in your mouth and seal your lips tightly around it.  Breathe in slowly and as deeply as possible, raising the piston or the ball toward the top of the column. Hold your breath for 3-5 seconds or for as long as possible. Allow the piston or ball to fall to the bottom of the column. Remove the mouthpiece from your mouth and breathe out normally. Rest for a few seconds and repeat Steps 1 through 7 at least 10 times every 1-2 hours when you are awake. Take your time and take a few normal breaths between deep breaths. The spirometer may include an indicator to show your best effort. Use the indicator as a goal to work toward during each repetition. After each set of 10 deep breaths, practice coughing to be sure your lungs are clear. If you have an incision (the cut made at the time of surgery), support your incision when coughing by placing a pillow or rolled up towels firmly against it. Once you are able to get out of bed, walk around indoors and cough well. You may stop using the incentive spirometer when instructed by your caregiver.  RISKS AND COMPLICATIONS Take your time so you do not get dizzy or light-headed. If you are in pain, you may need to take or ask for pain medication before doing incentive spirometry. It is harder to take a deep breath if you are having pain. AFTER USE Rest and breathe slowly and easily. It can be helpful to  keep track of a log of your progress. Your caregiver can provide you with a simple table to help with this. If you are using the spirometer at home, follow these instructions: SEEK MEDICAL CARE IF:  You are having difficultly using the spirometer. You have trouble using the spirometer as often as instructed. Your pain medication is not giving enough relief while using the spirometer. You develop fever of 100.5 F (38.1 C) or higher. SEEK IMMEDIATE MEDICAL CARE IF:  You cough up bloody sputum that had not been present before. You develop fever of 102 F (38.9 C) or greater. You develop worsening pain at or near the incision site. MAKE SURE YOU:  Understand these instructions. Will watch your condition. Will get help right away if you are not doing well or get worse. Document Released: 11/04/2006 Document Revised: 09/16/2011 Document Reviewed: 01/05/2007 [facility] Patient Information 2014 [place], [place]. WHAT IS A BLOOD TRANSFUSION? Blood Transfusion Information  A transfusion is the replacement of blood or some of its parts. Blood is made up of multiple cells which provide different functions. Red blood cells carry oxygen and are used for blood loss replacement. White blood cells fight against infection. Platelets control bleeding. Plasma helps clot blood. Other blood products are available for specialized needs, such as hemophilia or other clotting disorders. BEFORE THE TRANSFUSION  Who gives blood for transfusions?  Healthy volunteers who are fully evaluated to make sure their blood is safe. This is blood bank blood. Transfusion therapy is the safest it has ever been in the practice of medicine. Before blood is taken from a donor, a complete history is taken to make sure that person has no history of diseases nor engages in risky social behavior (examples are intravenous drug use or sexual activity with multiple partners). The donor's travel history is screened to minimize risk of  transmitting infections, such as malaria. The donated blood is tested for signs of infectious diseases, such as HIV and hepatitis. The blood is then tested to be sure it is compatible with you in order to minimize the  chance of a transfusion reaction. If you or a relative donates blood, this is often done in anticipation of surgery and is not appropriate for emergency situations. It takes many days to process the donated blood. RISKS AND COMPLICATIONS Although transfusion therapy is very safe and saves many lives, the main dangers of transfusion include:  Getting an infectious disease. Developing a transfusion reaction. This is an allergic reaction to something in the blood you were given. Every precaution is taken to prevent this. The decision to have a blood transfusion has been considered carefully by your caregiver before blood is given. Blood is not given unless the benefits outweigh the risks. AFTER THE TRANSFUSION Right after receiving a blood transfusion, you will usually feel much better and more energetic. This is especially true if your red blood cells have gotten low (anemic). The transfusion raises the level of the red blood cells which carry oxygen, and this usually causes an energy increase. The nurse administering the transfusion will monitor you carefully for complications. HOME CARE INSTRUCTIONS  No special instructions are needed after a transfusion. You may find your energy is better. Speak with your caregiver about any limitations on activity for underlying diseases you may have. SEEK MEDICAL CARE IF:  Your condition is not improving after your transfusion. You develop redness or irritation at the intravenous (IV) site. SEEK IMMEDIATE MEDICAL CARE IF:  Any of the following symptoms occur over the next 12 hours: Shaking chills. You have a temperature by mouth above 102 F (38.9 C), not controlled by medicine. Chest, back, or muscle pain. People around you feel you are not  acting correctly or are confused. Shortness of breath or difficulty breathing. Dizziness and fainting. You get a rash or develop hives. You have a decrease in urine output. Your urine turns a dark color or changes to pink, red, or brown. Any of the following symptoms occur over the next 10 days: You have a temperature by mouth above 102 F (38.9 C), not controlled by medicine. Shortness of breath. Weakness after normal activity. The white part of the eye turns yellow (jaundice). You have a decrease in the amount of urine or are urinating less often. Your urine turns a dark color or changes to pink, red, or brown. Document Released: 06/21/2000 Document Revised: 09/16/2011 Document Reviewed: 02/08/2008 [facility] Patient Information 2014 [place], [place].

## 2023-02-10 ENCOUNTER — Encounter (HOSPITAL_COMMUNITY): Payer: Self-pay

## 2023-02-10 ENCOUNTER — Encounter (HOSPITAL_COMMUNITY)
Admission: RE | Admit: 2023-02-10 | Discharge: 2023-02-10 | Disposition: A | Discharge status: Home / Self Care | Payer: Medicare Other | Source: Ambulatory Visit | Attending: Orthopaedic Surgery | Admitting: Orthopaedic Surgery

## 2023-02-10 ENCOUNTER — Other Ambulatory Visit: Payer: Self-pay

## 2023-02-10 VITALS — BP 142/68 | HR 65 | Temp 98.2°F | Resp 18 | Ht 64.0 in | Wt 190.0 lb

## 2023-02-10 DIAGNOSIS — M1612 Unilateral primary osteoarthritis, left hip: Secondary | ICD-10-CM | POA: Diagnosis not present

## 2023-02-10 DIAGNOSIS — E119 Type 2 diabetes mellitus without complications: Secondary | ICD-10-CM

## 2023-02-10 DIAGNOSIS — Z01812 Encounter for preprocedural laboratory examination: Secondary | ICD-10-CM | POA: Insufficient documentation

## 2023-02-10 DIAGNOSIS — Z01810 Encounter for preprocedural cardiovascular examination: Secondary | ICD-10-CM | POA: Insufficient documentation

## 2023-02-10 DIAGNOSIS — Z01818 Encounter for other preprocedural examination: Secondary | ICD-10-CM | POA: Diagnosis present

## 2023-02-10 DIAGNOSIS — R9431 Abnormal electrocardiogram [ECG] [EKG]: Secondary | ICD-10-CM | POA: Insufficient documentation

## 2023-02-10 DIAGNOSIS — I10 Essential (primary) hypertension: Secondary | ICD-10-CM | POA: Diagnosis not present

## 2023-02-10 HISTORY — DX: Cardiac murmur, unspecified: R01.1

## 2023-02-10 HISTORY — DX: Atherosclerotic heart disease of native coronary artery without angina pectoris: I25.10

## 2023-02-10 LAB — COMPREHENSIVE METABOLIC PANEL
ALT: 34 U/L (ref 0–44)
AST: 24 U/L (ref 15–41)
Albumin: 3.4 g/dL — ABNORMAL LOW (ref 3.5–5.0)
Alkaline Phosphatase: 87 U/L (ref 38–126)
Anion gap: 8 (ref 5–15)
BUN: 13 mg/dL (ref 8–23)
CO2: 24 mmol/L (ref 22–32)
Calcium: 8.8 mg/dL — ABNORMAL LOW (ref 8.9–10.3)
Chloride: 101 mmol/L (ref 98–111)
Creatinine, Ser: 0.72 mg/dL (ref 0.44–1.00)
GFR, Estimated: >60 mL/min (ref >60)
Glucose, Bld: 194 mg/dL — ABNORMAL HIGH (ref 70–99)
Potassium: 4.3 mmol/L (ref 3.5–5.1)
Sodium: 133 mmol/L — ABNORMAL LOW (ref 135–145)
Total Bilirubin: 0.4 mg/dL (ref 0.3–1.2)
Total Protein: 6.7 g/dL (ref 6.5–8.1)

## 2023-02-10 LAB — HEMOGLOBIN A1C
Hgb A1c MFr Bld: 8.8 % — ABNORMAL HIGH (ref 4.8–5.6)
Mean Plasma Glucose: 205.86 mg/dL

## 2023-02-10 LAB — TYPE AND SCREEN
ABO/RH(D): A POS
Antibody Screen: NEGATIVE

## 2023-02-10 LAB — CBC
HCT: 36.7 % (ref 36.0–46.0)
Hemoglobin: 12.3 g/dL (ref 12.0–15.0)
MCH: 31.4 pg (ref 26.0–34.0)
MCHC: 33.5 g/dL (ref 30.0–36.0)
MCV: 93.6 fL (ref 80.0–100.0)
Platelets: 302 10*3/uL (ref 150–400)
RBC: 3.92 MIL/uL (ref 3.87–5.11)
RDW: 12.4 % (ref 11.5–15.5)
WBC: 7.4 10*3/uL (ref 4.0–10.5)
nRBC: 0 % (ref 0.0–0.2)

## 2023-02-10 LAB — GLUCOSE, CAPILLARY: Glucose-Capillary: 216 mg/dL — ABNORMAL HIGH (ref 70–99)

## 2023-02-10 LAB — SURGICAL PCR SCREEN
MRSA, PCR: NEGATIVE
Staphylococcus aureus: NEGATIVE

## 2023-02-10 NOTE — Progress Notes (Addendum)
Please review pt's  preop Hbg A1c and CBC results from 02/10/23.

## 2023-02-17 ENCOUNTER — Telehealth: Payer: Self-pay | Admitting: Cardiology

## 2023-02-17 NOTE — Telephone Encounter (Signed)
Patient is requesting to speak with one of our pharmacist.

## 2023-02-18 NOTE — Telephone Encounter (Signed)
Returned call to pt and left message.  I have seen pt for her cholesterol. Recently started on Leqvio on 7/11, second injection due 10/11, continues on ezetimibe. Due for updated lipid panel a few weeks after second dose. Previously intolerant to atorvastatin 40-80mg  daily and 40mg  QOD - myalgias; rosuvastatin 20mg  daily - numbness in feet, weakness, joint pain; fluvastatin - numbness in feet. LDL goal < 55 with hx of CAD, DM, and HTN.

## 2023-02-19 MED ORDER — ATORVASTATIN CALCIUM 40 MG PO TABS: 40.0000 mg | ORAL_TABLET | ORAL | 3 refills | Status: DC

## 2023-02-19 NOTE — Telephone Encounter (Signed)
Pt left message. I returned her call, she states she's had burning in her feet ever since her first Leqvio injection on 7/11. Needs hip surgery and had preop labs, her A1c increased to 8.8% and states her surgery was canceled. Reports no difference in diet and attributes increase in her A1c to her Leqvio as well. Advised her that Wilber Bihari was not shown to affect glucose or cause burning in the feet in clinical trials. She has also reported burning in her feet on other statins as well. She does not want to continue on Leqvio. She wishes to go back to atorvastatin 40mg  every other day. She will continue on ezetimibe 10mg  daily. Added note to her Dec appt with Dr Clifton James to recheck lipids at that time. Advised her to contact her PCP for DM management.

## 2023-02-21 ENCOUNTER — Other Ambulatory Visit: Payer: Self-pay

## 2023-02-21 ENCOUNTER — Ambulatory Visit (HOSPITAL_COMMUNITY): Admission: RE | Admit: 2023-02-21 | Payer: Medicare Other | Source: Ambulatory Visit | Admitting: Orthopaedic Surgery

## 2023-02-21 ENCOUNTER — Encounter (HOSPITAL_COMMUNITY): Admission: RE | Payer: Self-pay | Source: Ambulatory Visit

## 2023-02-21 DIAGNOSIS — E782 Mixed hyperlipidemia: Secondary | ICD-10-CM

## 2023-02-21 DIAGNOSIS — I2510 Atherosclerotic heart disease of native coronary artery without angina pectoris: Secondary | ICD-10-CM

## 2023-02-21 DIAGNOSIS — Z951 Presence of aortocoronary bypass graft: Secondary | ICD-10-CM

## 2023-02-21 DIAGNOSIS — M1612 Unilateral primary osteoarthritis, left hip: Secondary | ICD-10-CM

## 2023-02-21 DIAGNOSIS — Z789 Other specified health status: Secondary | ICD-10-CM

## 2023-02-21 DIAGNOSIS — E785 Hyperlipidemia, unspecified: Secondary | ICD-10-CM

## 2023-02-21 DIAGNOSIS — Z79899 Other long term (current) drug therapy: Secondary | ICD-10-CM

## 2023-02-21 SURGERY — ARTHROPLASTY, HIP, TOTAL, ANTERIOR APPROACH
Anesthesia: Spinal | Site: Hip | Laterality: Left

## 2023-02-21 MED ORDER — METOPROLOL SUCCINATE ER 25 MG PO TB24
12.5000 mg | ORAL_TABLET | Freq: Every day | ORAL | 2 refills | Status: DC
Start: 2023-02-21 — End: 2023-10-14

## 2023-02-24 ENCOUNTER — Other Ambulatory Visit: Payer: Self-pay

## 2023-02-24 DIAGNOSIS — E782 Mixed hyperlipidemia: Secondary | ICD-10-CM

## 2023-02-24 DIAGNOSIS — Z789 Other specified health status: Secondary | ICD-10-CM

## 2023-02-24 DIAGNOSIS — Z79899 Other long term (current) drug therapy: Secondary | ICD-10-CM

## 2023-02-24 DIAGNOSIS — I2510 Atherosclerotic heart disease of native coronary artery without angina pectoris: Secondary | ICD-10-CM

## 2023-02-24 DIAGNOSIS — E785 Hyperlipidemia, unspecified: Secondary | ICD-10-CM

## 2023-02-24 DIAGNOSIS — Z951 Presence of aortocoronary bypass graft: Secondary | ICD-10-CM

## 2023-02-24 MED ORDER — EZETIMIBE 10 MG PO TABS: 10.0000 mg | ORAL_TABLET | Freq: Every day | ORAL | 2 refills | Status: AC

## 2023-02-25 DIAGNOSIS — E1165 Type 2 diabetes mellitus with hyperglycemia: Secondary | ICD-10-CM | POA: Diagnosis not present

## 2023-02-27 DIAGNOSIS — G720 Drug-induced myopathy: Secondary | ICD-10-CM | POA: Diagnosis not present

## 2023-02-27 DIAGNOSIS — K760 Fatty (change of) liver, not elsewhere classified: Secondary | ICD-10-CM | POA: Diagnosis not present

## 2023-02-27 DIAGNOSIS — E1165 Type 2 diabetes mellitus with hyperglycemia: Secondary | ICD-10-CM | POA: Diagnosis not present

## 2023-02-27 DIAGNOSIS — E782 Mixed hyperlipidemia: Secondary | ICD-10-CM | POA: Diagnosis not present

## 2023-02-27 DIAGNOSIS — Z789 Other specified health status: Secondary | ICD-10-CM | POA: Diagnosis not present

## 2023-03-06 ENCOUNTER — Encounter: Payer: Medicare Other | Admitting: Orthopaedic Surgery

## 2023-04-18 ENCOUNTER — Ambulatory Visit: Payer: Medicare Other

## 2023-04-28 DIAGNOSIS — H0288B Meibomian gland dysfunction left eye, upper and lower eyelids: Secondary | ICD-10-CM | POA: Diagnosis not present

## 2023-04-28 DIAGNOSIS — H2513 Age-related nuclear cataract, bilateral: Secondary | ICD-10-CM | POA: Diagnosis not present

## 2023-04-28 DIAGNOSIS — H5203 Hypermetropia, bilateral: Secondary | ICD-10-CM | POA: Diagnosis not present

## 2023-04-28 DIAGNOSIS — H40023 Open angle with borderline findings, high risk, bilateral: Secondary | ICD-10-CM | POA: Diagnosis not present

## 2023-04-28 DIAGNOSIS — H04123 Dry eye syndrome of bilateral lacrimal glands: Secondary | ICD-10-CM | POA: Diagnosis not present

## 2023-04-28 DIAGNOSIS — H524 Presbyopia: Secondary | ICD-10-CM | POA: Diagnosis not present

## 2023-04-28 DIAGNOSIS — E119 Type 2 diabetes mellitus without complications: Secondary | ICD-10-CM | POA: Diagnosis not present

## 2023-05-09 DIAGNOSIS — I872 Venous insufficiency (chronic) (peripheral): Secondary | ICD-10-CM | POA: Diagnosis not present

## 2023-05-13 DIAGNOSIS — N182 Chronic kidney disease, stage 2 (mild): Secondary | ICD-10-CM | POA: Diagnosis not present

## 2023-05-13 DIAGNOSIS — I700 Atherosclerosis of aorta: Secondary | ICD-10-CM | POA: Diagnosis not present

## 2023-05-13 DIAGNOSIS — E1122 Type 2 diabetes mellitus with diabetic chronic kidney disease: Secondary | ICD-10-CM | POA: Diagnosis not present

## 2023-05-13 DIAGNOSIS — I2510 Atherosclerotic heart disease of native coronary artery without angina pectoris: Secondary | ICD-10-CM | POA: Diagnosis not present

## 2023-05-13 DIAGNOSIS — E782 Mixed hyperlipidemia: Secondary | ICD-10-CM | POA: Diagnosis not present

## 2023-05-13 DIAGNOSIS — K760 Fatty (change of) liver, not elsewhere classified: Secondary | ICD-10-CM | POA: Diagnosis not present

## 2023-05-13 DIAGNOSIS — R5383 Other fatigue: Secondary | ICD-10-CM | POA: Diagnosis not present

## 2023-05-19 DIAGNOSIS — E1122 Type 2 diabetes mellitus with diabetic chronic kidney disease: Secondary | ICD-10-CM | POA: Diagnosis not present

## 2023-05-19 DIAGNOSIS — E782 Mixed hyperlipidemia: Secondary | ICD-10-CM | POA: Diagnosis not present

## 2023-05-19 DIAGNOSIS — Z23 Encounter for immunization: Secondary | ICD-10-CM | POA: Diagnosis not present

## 2023-05-19 DIAGNOSIS — G720 Drug-induced myopathy: Secondary | ICD-10-CM | POA: Diagnosis not present

## 2023-05-19 DIAGNOSIS — I2510 Atherosclerotic heart disease of native coronary artery without angina pectoris: Secondary | ICD-10-CM | POA: Diagnosis not present

## 2023-05-19 DIAGNOSIS — K760 Fatty (change of) liver, not elsewhere classified: Secondary | ICD-10-CM | POA: Diagnosis not present

## 2023-05-19 DIAGNOSIS — I700 Atherosclerosis of aorta: Secondary | ICD-10-CM | POA: Diagnosis not present

## 2023-05-19 DIAGNOSIS — N182 Chronic kidney disease, stage 2 (mild): Secondary | ICD-10-CM | POA: Diagnosis not present

## 2023-06-09 ENCOUNTER — Ambulatory Visit: Payer: Medicare Other | Attending: Cardiovascular Disease | Admitting: Cardiovascular Disease

## 2023-06-09 ENCOUNTER — Encounter: Payer: Self-pay | Admitting: Cardiovascular Disease

## 2023-06-09 VITALS — BP 142/78 | HR 92 | Ht 64.0 in | Wt 179.0 lb

## 2023-06-09 DIAGNOSIS — I2510 Atherosclerotic heart disease of native coronary artery without angina pectoris: Secondary | ICD-10-CM

## 2023-06-09 DIAGNOSIS — I10 Essential (primary) hypertension: Secondary | ICD-10-CM | POA: Diagnosis not present

## 2023-06-09 DIAGNOSIS — E782 Mixed hyperlipidemia: Secondary | ICD-10-CM

## 2023-06-09 NOTE — Progress Notes (Signed)
Chief Complaint  Patient presents with   Follow-up    CAD   History of Present Illness: 82 yo female with history of CAD s/p CABG, DM, HTN, HLD and GERD who is here today for follow up. She has been followed by Dr. Shari Prows. She was seen in August 2023 with chest pain. Coronary CTA with critical LAD stenosis. Cardiac cath August 2023 with severe ostial LAD stenosis, not favorable for PCI. She underwent 1 V CABG in August 2023 with LIMA to the LAD. Echo August 2023 with LVEF=60-65%, no valve disease. She has not tolerated statins well in the past. She tried Leqvio in July but this made her feet burn. She does not wish to take again. She has been taking Lipitor 40 mg every other day and Zetia 10 mg daily.   She is here today for follow up. The patient denies any chest pain, dyspnea, palpitations, lower extremity edema, orthopnea, PND, dizziness, near syncope or syncope. She is feeling well overall.   Primary Care Physician: Georgianne Fick, MD   Past Medical History:  Diagnosis Date   Abdominal pain    Anxiety    Basal cell carcinoma    Coronary artery disease    DDD (degenerative disc disease), lumbar    Diabetes mellitus without complication (HCC)    DM (diabetes mellitus) (HCC)    Drug-induced myopathy    Elevated blood pressure reading in office without diagnosis of hypertension    GERD (gastroesophageal reflux disease)    Heart murmur    Hyperlipidemia    Menopause    NAFLD (nonalcoholic fatty liver disease)    OSA (obstructive sleep apnea)     Past Surgical History:  Procedure Laterality Date   BREAST EXCISIONAL BIOPSY Left    benign   CARPAL TUNNEL RELEASE     CORONARY ARTERY BYPASS GRAFT N/A 02/13/2022   Procedure: CORONARY ARTERY BYPASS GRAFTING (CABG) TIMES ONE USING LIMA.;  Surgeon: Loreli Slot, MD;  Location: [facility] OR;  Service: Open Heart Surgery;  Laterality: N/A;   FOOT SURGERY     LEFT HEART CATH AND CORONARY ANGIOGRAPHY N/A 02/11/2022   Procedure:  LEFT HEART CATH AND CORONARY ANGIOGRAPHY;  Surgeon: Tonny Bollman, MD;  Location: [facility] INVASIVE CV LAB;  Service: Cardiovascular;  Laterality: N/A;   NASAL SINUS SURGERY     pituitary adenoma resection     SHOULDER SURGERY     TEE WITHOUT CARDIOVERSION N/A 02/13/2022   Procedure: TRANSESOPHAGEAL ECHOCARDIOGRAM (TEE);  Surgeon: Loreli Slot, MD;  Location: [facility] OR;  Service: Open Heart Surgery;  Laterality: N/A;   TOTAL HIP ARTHROPLASTY     TUBAL LIGATION      Current Outpatient Medications  Medication Sig Dispense Refill   acetaminophen (TYLENOL) 500 MG tablet Take 500 mg by mouth every 6 (six) hours as needed for moderate pain.     aspirin 81 MG chewable tablet Chew 81 mg by mouth daily.     atorvastatin (LIPITOR) 40 MG tablet Take 1 tablet (40 mg total) by mouth every other day. 45 tablet 3   Cholecalciferol (VITAMIN D) 50 MCG (2000 UT) CAPS Take 2,000 Units by mouth daily.     ezetimibe (ZETIA) 10 MG tablet Take 1 tablet (10 mg total) by mouth daily. 90 tablet 2   fluticasone (FLONASE) 50 MCG/ACT nasal spray Place 2 sprays into both nostrils at bedtime.     folic acid (FOLVITE) 400 MCG tablet Take 400 mcg by mouth daily.  metoprolol succinate (TOPROL XL) 25 MG 24 hr tablet Take 0.5 tablets (12.5 mg total) by mouth daily. 45 tablet 2   Multiple Vitamins-Minerals (HAIR SKIN & NAILS ADVANCED PO) Take 1 tablet by mouth daily.     NON FORMULARY Pt uses a cpap nightly     omeprazole (PRILOSEC) 20 MG capsule Take 10 mg by mouth every evening.     polyethylene glycol (MIRALAX / GLYCOLAX) 17 g packet Take 17 g by mouth daily.     Polyvinyl Alcohol-Povidone (REFRESH OP) Place 1 drop into both eyes daily.     Turmeric 500 MG CAPS Take 500 mg by mouth in the morning and at bedtime.     Coenzyme Q10 (CO Q-10) 100 MG CAPS Take 100 mg by mouth daily. (Patient not taking: Reported on 06/09/2023)     No current facility-administered medications for this visit.    Allergies  Allergen  Reactions   Bactrim [Sulfamethoxazole-Trimethoprim]     Heart racing   Keflet [Cephalexin]     Makes me bleed   Leqvio [Inclisiran Sodium]     Reports burning in her feet and increase in A1c   Statins     worsening neuropathy and myalgias on atorvastatin 40mg  every other day, daily, and 80mg  daily, rosuvastatin 20mg  daily, and fluvastatin   Tetracyclines & Related     Yeast infection     Social History   Socioeconomic History   Marital status: Widowed    Spouse name: Not on file   Number of children: Not on file   Years of education: Not on file   Highest education level: Not on file  Occupational History   Not on file  Tobacco Use   Smoking status: Never   Smokeless tobacco: Never  Vaping Use   Vaping status: Never Used  Substance and Sexual Activity   Alcohol use: Yes    Alcohol/week: 1.0 standard drink of alcohol    Types: 1 Glasses of wine per week    Comment: rare   Drug use: Never   Sexual activity: Not on file  Other Topics Concern   Not on file  Social History Narrative   Not on file   Social Determinants of Health   Financial Resource Strain: Not on file  Food Insecurity: Low Risk  (01/07/2023)   [facility]   Hunger Vital Sign    Worried About Running Out of Food in the Last Year: Never true    Ran Out of Food in the Last Year: Never true  Transportation Needs: No Transportation Needs (01/07/2023)   [facility]   Transportation    In the past 12 months, has lack of reliable transportation kept you from medical appointments, meetings, work or from getting things needed for daily living? : No  Physical Activity: Not on file  Stress: Not on file  Social Connections: Not on file  Intimate Partner Violence: Not on file    Family History  Problem Relation Age of Onset   Cancer Mother    Cancer Father     Review of Systems:  As stated in the HPI and otherwise negative.   BP (!) 142/78    Pulse 92   Ht 5\' 4"  (1.626 m)   Wt 81.2 kg   SpO2 99%   BMI 30.73 kg/m   Physical Examination: General: Well developed, well nourished, NAD  HEENT: OP clear, mucus membranes moist  SKIN: warm, dry. No rashes. Neuro:  No focal deficits  Musculoskeletal: Muscle strength 5/5 all ext  Psychiatric: Mood and affect normal  Neck: No JVD, no carotid bruits, no thyromegaly, no lymphadenopathy.  Lungs:Clear bilaterally, no wheezes, rhonci, crackles Cardiovascular: Regular rate and rhythm. No murmurs, gallops or rubs. Abdomen:Soft. Bowel sounds present. Non-tender.  Extremities: No lower extremity edema. Pulses are 2 + in the bilateral DP/PT.  EKG:  EKG is ordered today. The ekg ordered today demonstrates   Recent Labs: 02/10/2023: ALT 34; BUN 13; Creatinine, Ser 0.72; Hemoglobin 12.3; Platelets 302; Potassium 4.3; Sodium 133   Lipid Panel    Component Value Date/Time   CHOL 118 09/04/2022 0731   TRIG 109 09/04/2022 0731   HDL 46 09/04/2022 0731   CHOLHDL 2.6 09/04/2022 0731   CHOLHDL 4.8 02/12/2022 0808   VLDL 32 02/12/2022 0808   LDLCALC 52 09/04/2022 0731     Wt Readings from Last 3 Encounters:  06/09/23 81.2 kg  02/10/23 86.2 kg  01/16/23 87.9 kg    Assessment and Plan:   1. CAD s/p CABG without angina: She has no chest pain suggestive of angina. Will continue ASA, statin, Zetia and beta blocker.   2. Hyperlipidemia: She has not tolerated statins well in the past. She was seen in the lipid clinic in June 2024 and was started on Leqvio in July 2024 but did not tolerate this. She does not wish to try Repatha or Praluent. She is back on Lipitor 40 mg every other day and Zetia 10 mg daily and tolerating well. LDL 19 last month in primary care. This may have been down still from the Leqvio that she took in July 2024. She will have repeat lipids in March 2025 in primary care.  Continue Lipitor 40 mg every other day and Zetia 10 mg for now.   3. HTN: BP is well controlled at home.  Continue current therapy.   4. Systolic murmur: Soft murmur. No valve disease on echo in August 2023.   Labs/ tests ordered today include:  No orders of the defined types were placed in this encounter.  Disposition:   F/U with me in 12 months    Signed, Verne Carrow, MD, [facility] 06/09/2023 4:03 PM    [facility] [address] [place], [place], [place]  [address] Phone: [phone]; Fax: [phone]

## 2023-06-09 NOTE — Patient Instructions (Signed)
Medication Instructions:  No changes *If you need a refill on your cardiac medications before your next appointment, please call your pharmacy*   Lab Work: none   Testing/Procedures: none   Follow-Up: At [facility], you and your health needs are our priority.  As part of our continuing mission to provide you with exceptional heart care, we have created designated Provider Care Teams.  These Care Teams include your primary Cardiologist (physician) and Advanced Practice Providers (APPs -  Physician Assistants and Nurse Practitioners) who all work together to provide you with the care you need, when you need it.   Your next appointment:   12 month(s)  Provider:   Christopher McAlhany, MD   

## 2023-07-18 ENCOUNTER — Telehealth: Payer: Self-pay

## 2023-07-18 NOTE — Telephone Encounter (Signed)
 Auth Submission: NO AUTH NEEDED Site of care: Site of care: [facility] Payer: Medicare A/B with AARP supplment Medication & CPT/J Code(s) submitted: Leqvio  (Inclisiran) J1306 Route of submission (phone, fax, portal):  Phone # Fax # Auth type: Buy/Bill PB Units/visits requested: 284mg  x 2 doses Reference number:  Approval from: 01/02/23 to 08/07/24

## 2023-07-21 ENCOUNTER — Other Ambulatory Visit: Payer: Self-pay | Admitting: Internal Medicine

## 2023-07-21 DIAGNOSIS — Z1231 Encounter for screening mammogram for malignant neoplasm of breast: Secondary | ICD-10-CM

## 2023-08-05 NOTE — Progress Notes (Addendum)
COVID Vaccine Completed:  Date of COVID positive in last 90 days:  PCP - Georgianne Fick, MD Cardiologist - Verne Carrow, MD LOV 06/09/23  Chest x-ray -  EKG - 02/10/23 Epic Stress Test -  ECHO - 03/15/22 Epic Cardiac Cath - 02/11/22 Epic Pacemaker/ICD device last checked: Spinal Cord Stimulator:  Bowel Prep -   Sleep Study -  CPAP -   Fasting Blood Sugar -  Checks Blood Sugar _____ times a day  Last dose of GLP1 agonist-  N/A GLP1 instructions:  Hold 7 days before surgery    Last dose of SGLT-2 inhibitors-  N/A SGLT-2 instructions:  Hold 3 days before surgery    Blood Thinner Instructions:  Time Aspirin Instructions: ASA 81 Last Dose:  Activity level:  Can go up a flight of stairs and perform activities of daily living without stopping and without symptoms of chest pain or shortness of breath.  Able to exercise without symptoms  Unable to go up a flight of stairs without symptoms of     Anesthesia review: CAD, CABG, HTN, DM2, systolic murmur, CKD, fatty liver  Patient denies shortness of breath, fever, cough and chest pain at PAT appointment  Patient verbalized understanding of instructions that were given to them at the PAT appointment. Patient was also instructed that they will need to review over the PAT instructions again at home before surgery.

## 2023-08-05 NOTE — Patient Instructions (Signed)
SURGICAL WAITING ROOM VISITATION  Patients having surgery or a procedure may have no more than 2 support people in the waiting area - these visitors may rotate.    Children under the age of 33 must have an adult with them who is not the patient.  Due to an increase in RSV and influenza rates and associated hospitalizations, children ages 57 and under may not visit patients in [facility] hospitals.  Visitors with respiratory illnesses are discouraged from visiting and should remain at home.  If the patient needs to stay at the hospital during part of their recovery, the visitor guidelines for inpatient rooms apply. Pre-op nurse will coordinate an appropriate time for 1 support person to accompany patient in pre-op.  This support person may not rotate.    Please refer to the [facility] website for the visitor guidelines for Inpatients (after your surgery is over and you are in a regular room).    Your procedure is scheduled on: 08/15/23   Report to [facility] Main Entrance    Report to admitting at 7:30 AM   Call this number if you have problems the morning of surgery [phone]   Do not eat food :After Midnight.   After Midnight you may have the following liquids until 7:00 AM DAY OF SURGERY  Water Non-Citrus Juices (without pulp, NO RED-Apple, White grape, White cranberry) Black Coffee (NO MILK/CREAM OR CREAMERS, sugar ok)  Clear Tea (NO MILK/CREAM OR CREAMERS, sugar ok) regular and decaf                             Plain Jell-O (NO RED)                                           Fruit ices (not with fruit pulp, NO RED)                                     Popsicles (NO RED)                                                               Sports drinks like Gatorade (NO RED)     The day of surgery:  Drink ONE (1) Pre-Surgery G2 at 7:00 AM the morning of surgery. Drink in one sitting. Do not sip.  This drink was given to you during your hospital  pre-op appointment  visit. Nothing else to drink after completing the  Pre-Surgery G2.          If you have questions, please contact your surgeon's office.   FOLLOW BOWEL PREP AND ANY ADDITIONAL PRE OP INSTRUCTIONS YOU RECEIVED FROM YOUR SURGEON'S OFFICE!!!     Oral Hygiene is also important to reduce your risk of infection.                                    Remember - BRUSH YOUR TEETH THE MORNING OF SURGERY WITH YOUR REGULAR TOOTHPASTE  DENTURES WILL  BE REMOVED PRIOR TO SURGERY PLEASE DO NOT APPLY "Poly grip" OR ADHESIVES!!!   Stop all vitamins and herbal supplements 7 days before surgery.   Take these medicines the morning of surgery with A SIP OF WATER: Tylenol ,Atorvastatin, Zetia, Metoprolol   DO NOT TAKE ANY ORAL DIABETIC MEDICATIONS DAY OF YOUR SURGERY  How to Manage Your Diabetes Before and After Surgery  Why is it important to control my blood sugar before and after surgery? Improving blood sugar levels before and after surgery helps healing and can limit problems. A way of improving blood sugar control is eating a healthy diet by:  Eating less sugar and carbohydrates  Increasing activity/exercise  Talking with your doctor about reaching your blood sugar goals High blood sugars (greater than 180 mg/dL) can raise your risk of infections and slow your recovery, so you will need to focus on controlling your diabetes during the weeks before surgery. Make sure that the doctor who takes care of your diabetes knows about your planned surgery including the date and location.  How do I manage my blood sugar before surgery? Check your blood sugar at least 4 times a day, starting 2 days before surgery, to make sure that the level is not too high or low. Check your blood sugar the morning of your surgery when you wake up and every 2 hours until you get to the Short Stay unit. If your blood sugar is less than 70 mg/dL, you will need to treat for low blood sugar: Do not take insulin. Treat a low blood  sugar (less than 70 mg/dL) with  cup of clear juice (cranberry or apple), 4 glucose tablets, OR glucose gel. Recheck blood sugar in 15 minutes after treatment (to make sure it is greater than 70 mg/dL). If your blood sugar is not greater than 70 mg/dL on recheck, call [phone] for further instructions. Report your blood sugar to the short stay nurse when you get to Short Stay.  If you are admitted to the hospital after surgery: Your blood sugar will be checked by the staff and you will probably be given insulin after surgery (instead of oral diabetes medicines) to make sure you have good blood sugar levels. The goal for blood sugar control after surgery is 80-180 mg/dL.   WHAT DO I DO ABOUT MY DIABETES MEDICATION?  Do not take oral diabetes medicines (pills) the morning of surgery.  THE DAY BEFORE SURGERY, take Metformin as prescribed.      THE MORNING OF SURGERY, do not take Metformin.   Reviewed and Endorsed by [facility] Patient Education Committee, August 2015  Bring CPAP mask and tubing day of surgery.                              You may not have any metal on your body including hair pins, jewelry, and body piercing             Do not wear make-up, lotions, powders, perfumes, or deodorant  Do not wear nail polish including gel and S&S, artificial/acrylic nails, or any other type of covering on natural nails including finger and toenails. If you have artificial nails, gel coating, etc. that needs to be removed by a nail salon please have this removed prior to surgery or surgery may need to be canceled/ delayed if the surgeon/ anesthesia feels like they are unable to be safely monitored.   Do not shave  48  hours prior to surgery.    Do not bring valuables to the hospital. [facility] IS NOT             RESPONSIBLE   FOR VALUABLES.   Contacts, glasses, dentures or bridgework may not be worn into surgery.   Bring small overnight bag day of surgery.   DO NOT BRING YOUR  HOME MEDICATIONS TO THE HOSPITAL. PHARMACY WILL DISPENSE MEDICATIONS LISTED ON YOUR MEDICATION LIST TO YOU DURING YOUR ADMISSION IN THE HOSPITAL!              Please read over the following fact sheets you were given: IF YOU HAVE QUESTIONS ABOUT YOUR PRE-OP INSTRUCTIONS PLEASE CALL [phone]    If you received a COVID test during your pre-op visit  it is requested that you wear a mask when out in public, stay away from anyone that may not be feeling well and notify your surgeon if you develop symptoms. If you test positive for Covid or have been in contact with anyone that has tested positive in the last 10 days please notify you surgeon.      Pre-operative 5 CHG Bath Instructions   You can play a key role in reducing the risk of infection after surgery. Your skin needs to be as free of germs as possible. You can reduce the number of germs on your skin by washing with CHG (chlorhexidine gluconate) soap before surgery. CHG is an antiseptic soap that kills germs and continues to kill germs even after washing.   DO NOT use if you have an allergy to chlorhexidine/CHG or antibacterial soaps. If your skin becomes reddened or irritated, stop using the CHG and notify one of our RNs at [phone].   Please shower with the CHG soap starting 4 days before surgery using the following schedule:     Please keep in mind the following:  DO NOT shave, including legs and underarms, starting the day of your first shower.   You may shave your face at any point before/day of surgery.  Place clean sheets on your bed the day you start using CHG soap. Use a clean washcloth (not used since being washed) for each shower. DO NOT sleep with pets once you start using the CHG.   CHG Shower Instructions:  If you choose to wash your hair and private area, wash first with your normal shampoo/soap.  After you use shampoo/soap, rinse your hair and body thoroughly to remove shampoo/soap residue.  Turn the  water OFF and apply about 3 tablespoons (45 ml) of CHG soap to a CLEAN washcloth.  Apply CHG soap ONLY FROM YOUR NECK DOWN TO YOUR TOES (washing for 3-5 minutes)  DO NOT use CHG soap on face, private areas, open wounds, or sores.  Pay special attention to the area where your surgery is being performed.  If you are having back surgery, having someone wash your back for you may be helpful. Wait 2 minutes after CHG soap is applied, then you may rinse off the CHG soap.  Pat dry with a clean towel  Put on clean clothes/pajamas   If you choose to wear lotion, please use ONLY the CHG-compatible lotions on the back of this paper.     Additional instructions for the day of surgery: DO NOT APPLY any lotions, deodorants, cologne, or perfumes.   Put on clean/comfortable clothes.  Brush your teeth.  Ask your nurse before applying any prescription medications to the skin.  CHG Compatible Lotions   Aveeno Moisturizing lotion  Cetaphil Moisturizing Cream  Cetaphil Moisturizing Lotion  Clairol Herbal Essence Moisturizing Lotion, Dry Skin  Clairol Herbal Essence Moisturizing Lotion, Extra Dry Skin  Clairol Herbal Essence Moisturizing Lotion, Normal Skin  Curel Age Defying Therapeutic Moisturizing Lotion with Alpha Hydroxy  Curel Extreme Care Body Lotion  Curel Soothing Hands Moisturizing Hand Lotion  Curel Therapeutic Moisturizing Cream, Fragrance-Free  Curel Therapeutic Moisturizing Lotion, Fragrance-Free  Curel Therapeutic Moisturizing Lotion, Original Formula  Eucerin Daily Replenishing Lotion  Eucerin Dry Skin Therapy Plus Alpha Hydroxy Crme  Eucerin Dry Skin Therapy Plus Alpha Hydroxy Lotion  Eucerin Original Crme  Eucerin Original Lotion  Eucerin Plus Crme Eucerin Plus Lotion  Eucerin TriLipid Replenishing Lotion  Keri Anti-Bacterial Hand Lotion  Keri Deep Conditioning Original Lotion Dry Skin Formula Softly Scented  Keri Deep Conditioning Original Lotion, Fragrance Free  Sensitive Skin Formula  Keri Lotion Fast Absorbing Fragrance Free Sensitive Skin Formula  Keri Lotion Fast Absorbing Softly Scented Dry Skin Formula  Keri Original Lotion  Keri Skin Renewal Lotion Keri Silky Smooth Lotion  Keri Silky Smooth Sensitive Skin Lotion  Nivea Body Creamy Conditioning Oil  Nivea Body Extra Enriched Teacher, adult education Moisturizing Lotion Nivea Crme  Nivea Skin Firming Lotion  NutraDerm 30 Skin Lotion  NutraDerm Skin Lotion  NutraDerm Therapeutic Skin Cream  NutraDerm Therapeutic Skin Lotion  ProShield Protective Hand Cream  Provon moisturizing lotion  WHAT IS A BLOOD TRANSFUSION? Blood Transfusion Information  A transfusion is the replacement of blood or some of its parts. Blood is made up of multiple cells which provide different functions. Red blood cells carry oxygen and are used for blood loss replacement. White blood cells fight against infection. Platelets control bleeding. Plasma helps clot blood. Other blood products are available for specialized needs, such as hemophilia or other clotting disorders. BEFORE THE TRANSFUSION  Who gives blood for transfusions?  Healthy volunteers who are fully evaluated to make sure their blood is safe. This is blood bank blood. Transfusion therapy is the safest it has ever been in the practice of medicine. Before blood is taken from a donor, a complete history is taken to make sure that person has no history of diseases nor engages in risky social behavior (examples are intravenous drug use or sexual activity with multiple partners). The donor's travel history is screened to minimize risk of transmitting infections, such as malaria. The donated blood is tested for signs of infectious diseases, such as HIV and hepatitis. The blood is then tested to be sure it is compatible with you in order to minimize the chance of a transfusion reaction. If you or a relative donates blood, this is often  done in anticipation of surgery and is not appropriate for emergency situations. It takes many days to process the donated blood. RISKS AND COMPLICATIONS Although transfusion therapy is very safe and saves many lives, the main dangers of transfusion include:  Getting an infectious disease. Developing a transfusion reaction. This is an allergic reaction to something in the blood you were given. Every precaution is taken to prevent this. The decision to have a blood transfusion has been considered carefully by your caregiver before blood is given. Blood is not given unless the benefits outweigh the risks. AFTER THE TRANSFUSION Right after receiving a blood transfusion, you will usually feel much better and more energetic. This is especially true if your red blood cells have gotten low (anemic). The transfusion  raises the level of the red blood cells which carry oxygen, and this usually causes an energy increase. The nurse administering the transfusion will monitor you carefully for complications. HOME CARE INSTRUCTIONS  No special instructions are needed after a transfusion. You may find your energy is better. Speak with your caregiver about any limitations on activity for underlying diseases you may have. SEEK MEDICAL CARE IF:  Your condition is not improving after your transfusion. You develop redness or irritation at the intravenous (IV) site. SEEK IMMEDIATE MEDICAL CARE IF:  Any of the following symptoms occur over the next 12 hours: Shaking chills. You have a temperature by mouth above 102 F (38.9 C), not controlled by medicine. Chest, back, or muscle pain. People around you feel you are not acting correctly or are confused. Shortness of breath or difficulty breathing. Dizziness and fainting. You get a rash or develop hives. You have a decrease in urine output. Your urine turns a dark color or changes to pink, red, or brown. Any of the following symptoms occur over the next 10  days: You have a temperature by mouth above 102 F (38.9 C), not controlled by medicine. Shortness of breath. Weakness after normal activity. The white part of the eye turns yellow (jaundice). You have a decrease in the amount of urine or are urinating less often. Your urine turns a dark color or changes to pink, red, or brown. Document Released: 06/21/2000 Document Revised: 09/16/2011 Document Reviewed: 02/08/2008 [employer] Patient Information 2014 [place], [place].  _______________________________________________________________________  Incentive Spirometer  An incentive spirometer is a tool that can help keep your lungs clear and active. This tool measures how well you are filling your lungs with each breath. Taking long deep breaths may help reverse or decrease the chance of developing breathing (pulmonary) problems (especially infection) following: A long period of time when you are unable to move or be active. BEFORE THE PROCEDURE  If the spirometer includes an indicator to show your best effort, your nurse or respiratory therapist will set it to a desired goal. If possible, sit up straight or lean slightly forward. Try not to slouch. Hold the incentive spirometer in an upright position. INSTRUCTIONS FOR USE  Sit on the edge of your bed if possible, or sit up as far as you can in bed or on a chair. Hold the incentive spirometer in an upright position. Breathe out normally. Place the mouthpiece in your mouth and seal your lips tightly around it. Breathe in slowly and as deeply as possible, raising the piston or the ball toward the top of the column. Hold your breath for 3-5 seconds or for as long as possible. Allow the piston or ball to fall to the bottom of the column. Remove the mouthpiece from your mouth and breathe out normally. Rest for a few seconds and repeat Steps 1 through 7 at least 10 times every 1-2 hours when you are awake. Take your time and take a few normal breaths  between deep breaths. The spirometer may include an indicator to show your best effort. Use the indicator as a goal to work toward during each repetition. After each set of 10 deep breaths, practice coughing to be sure your lungs are clear. If you have an incision (the cut made at the time of surgery), support your incision when coughing by placing a pillow or rolled up towels firmly against it. Once you are able to get out of bed, walk around indoors and cough well. You may stop  using the incentive spirometer when instructed by your caregiver.  RISKS AND COMPLICATIONS Take your time so you do not get dizzy or light-headed. If you are in pain, you may need to take or ask for pain medication before doing incentive spirometry. It is harder to take a deep breath if you are having pain. AFTER USE Rest and breathe slowly and easily. It can be helpful to keep track of a log of your progress. Your caregiver can provide you with a simple table to help with this. If you are using the spirometer at home, follow these instructions: SEEK MEDICAL CARE IF:  You are having difficultly using the spirometer. You have trouble using the spirometer as often as instructed. Your pain medication is not giving enough relief while using the spirometer. You develop fever of 100.5 F (38.1 C) or higher. SEEK IMMEDIATE MEDICAL CARE IF:  You cough up bloody sputum that had not been present before. You develop fever of 102 F (38.9 C) or greater. You develop worsening pain at or near the incision site. MAKE SURE YOU:  Understand these instructions. Will watch your condition. Will get help right away if you are not doing well or get worse. Document Released: 11/04/2006 Document Revised: 09/16/2011 Document Reviewed: 01/05/2007 [facility] Patient Information 2014 [place], [place].   ________________________________________________________________________

## 2023-08-07 ENCOUNTER — Encounter (HOSPITAL_COMMUNITY)
Admission: RE | Admit: 2023-08-07 | Discharge: 2023-08-07 | Disposition: A | Discharge status: Home / Self Care | Payer: Medicare Other | Source: Ambulatory Visit | Attending: Orthopaedic Surgery | Admitting: Orthopaedic Surgery

## 2023-08-07 ENCOUNTER — Encounter (HOSPITAL_COMMUNITY): Payer: Self-pay

## 2023-08-07 ENCOUNTER — Other Ambulatory Visit: Payer: Self-pay

## 2023-08-07 VITALS — BP 159/79 | HR 73 | Temp 97.8°F | Resp 18 | Ht 64.0 in | Wt 171.0 lb

## 2023-08-07 DIAGNOSIS — Z01818 Encounter for other preprocedural examination: Secondary | ICD-10-CM

## 2023-08-07 DIAGNOSIS — M1612 Unilateral primary osteoarthritis, left hip: Secondary | ICD-10-CM | POA: Diagnosis not present

## 2023-08-07 DIAGNOSIS — K760 Fatty (change of) liver, not elsewhere classified: Secondary | ICD-10-CM | POA: Insufficient documentation

## 2023-08-07 DIAGNOSIS — Z951 Presence of aortocoronary bypass graft: Secondary | ICD-10-CM | POA: Diagnosis not present

## 2023-08-07 DIAGNOSIS — I10 Essential (primary) hypertension: Secondary | ICD-10-CM | POA: Insufficient documentation

## 2023-08-07 DIAGNOSIS — E119 Type 2 diabetes mellitus without complications: Secondary | ICD-10-CM | POA: Insufficient documentation

## 2023-08-07 DIAGNOSIS — Z01812 Encounter for preprocedural laboratory examination: Secondary | ICD-10-CM | POA: Diagnosis not present

## 2023-08-07 DIAGNOSIS — I2510 Atherosclerotic heart disease of native coronary artery without angina pectoris: Secondary | ICD-10-CM | POA: Insufficient documentation

## 2023-08-07 LAB — HEMOGLOBIN A1C
Hgb A1c MFr Bld: 6.8 % — ABNORMAL HIGH (ref 4.8–5.6)
Mean Plasma Glucose: 148.46 mg/dL

## 2023-08-07 LAB — CBC
HCT: 38.5 % (ref 36.0–46.0)
Hemoglobin: 12.5 g/dL (ref 12.0–15.0)
MCH: 31.4 pg (ref 26.0–34.0)
MCHC: 32.5 g/dL (ref 30.0–36.0)
MCV: 96.7 fL (ref 80.0–100.0)
Platelets: 344 10*3/uL (ref 150–400)
RBC: 3.98 MIL/uL (ref 3.87–5.11)
RDW: 12.5 % (ref 11.5–15.5)
WBC: 8.6 10*3/uL (ref 4.0–10.5)
nRBC: 0 % (ref 0.0–0.2)

## 2023-08-07 LAB — BASIC METABOLIC PANEL
Anion gap: 9 (ref 5–15)
BUN: 15 mg/dL (ref 8–23)
CO2: 24 mmol/L (ref 22–32)
Calcium: 8.9 mg/dL (ref 8.9–10.3)
Chloride: 99 mmol/L (ref 98–111)
Creatinine, Ser: 0.74 mg/dL (ref 0.44–1.00)
GFR, Estimated: >60 mL/min (ref >60)
Glucose, Bld: 151 mg/dL — ABNORMAL HIGH (ref 70–99)
Potassium: 4.1 mmol/L (ref 3.5–5.1)
Sodium: 132 mmol/L — ABNORMAL LOW (ref 135–145)

## 2023-08-07 LAB — SURGICAL PCR SCREEN
MRSA, PCR: NEGATIVE
Staphylococcus aureus: NEGATIVE

## 2023-08-07 LAB — GLUCOSE, CAPILLARY: Glucose-Capillary: 142 mg/dL — ABNORMAL HIGH (ref 70–99)

## 2023-08-08 NOTE — Progress Notes (Signed)
Anesthesia Chart Review   Case: [id number] Date/Time: 08/15/23 0945   Procedure: LEFT TOTAL HIP ARTHROPLASTY ANTERIOR APPROACH (Left: Hip)   Anesthesia type: Spinal   Pre-op diagnosis: LEFT HIP OSTEOARTHRITIS   Location: [facility] ROOM 09 / [facility]   Surgeons: Kathryne Hitch, MD       DISCUSSION:83 y.o. never smoker with h/o HTN, OSA, DM II, CAD s/p CABG, NAFLD, left hip OA scheduled for above procedure 08/15/2023 with Dr. Doneen Poisson.   Pt last seen by cardiology 06/09/2023. Per OV note pt without angina sx, BP well controlled. Pt with soft systolic murmur, no valve disease on Echo 02/2022. No medication changes made, 1 year follow up recommended.   Surgery previously cancelled due to elevated A1C.  A1C at PAT visit 6.8.   Prior to this cancellation note from cardiology states she is ok for hip surgery.  VS: BP (!) 159/79   Pulse 73   Temp 36.6 C (Oral)   Resp 18   Ht 5\' 4"  (1.626 m)   Wt 77.6 kg   SpO2 97%   BMI 29.35 kg/m   PROVIDERS: Georgianne Fick, MD is PCP  Cardiologist - Verne Carrow, MD  LABS: Labs reviewed: Acceptable for surgery. (all labs ordered are listed, but only abnormal results are displayed)  Labs Reviewed  HEMOGLOBIN A1C - Abnormal; Notable for the following components:      Result Value   Hgb A1c MFr Bld 6.8 (*)    All other components within normal limits  BASIC METABOLIC PANEL - Abnormal; Notable for the following components:   Sodium 132 (*)    Glucose, Bld 151 (*)    All other components within normal limits  GLUCOSE, CAPILLARY - Abnormal; Notable for the following components:   Glucose-Capillary 142 (*)    All other components within normal limits  SURGICAL PCR SCREEN  CBC  TYPE AND SCREEN     IMAGES:   EKG:   CV: Echo 02/12/2022 1. There is a very small area of apicolateral hypokinesis. Left  ventricular ejection fraction, by estimation, is 60 to 65%. The left  ventricle has normal function. The left  ventricle demonstrates regional  wall motion abnormalities (see scoring  diagram/findings for description). There is mild concentric left  ventricular hypertrophy. Left ventricular diastolic parameters are  consistent with Grade I diastolic dysfunction (impaired relaxation).  Elevated left atrial pressure.   2. Right ventricular systolic function is normal. The right ventricular  size is normal. Tricuspid regurgitation signal is inadequate for assessing  PA pressure.   3. The mitral valve is normal in structure. No evidence of mitral valve  regurgitation. No evidence of mitral stenosis.   4. The aortic valve is normal in structure. Aortic valve regurgitation is  not visualized. No aortic stenosis is present.   5. The inferior vena cava is dilated in size with >50% respiratory  variability, suggesting right atrial pressure of 8 mmHg.  Past Medical History:  Diagnosis Date   Abdominal pain    Anxiety    Basal cell carcinoma    Coronary artery disease    DDD (degenerative disc disease), lumbar    Diabetes mellitus without complication (HCC)    DM (diabetes mellitus) (HCC)    Drug-induced myopathy    Elevated blood pressure reading in office without diagnosis of hypertension    GERD (gastroesophageal reflux disease)    Heart murmur    Hyperlipidemia    Menopause    NAFLD (nonalcoholic fatty liver disease)  OSA (obstructive sleep apnea)     Past Surgical History:  Procedure Laterality Date   BREAST EXCISIONAL BIOPSY Left    benign   CARPAL TUNNEL RELEASE     CORONARY ARTERY BYPASS GRAFT N/A 02/13/2022   Procedure: CORONARY ARTERY BYPASS GRAFTING (CABG) TIMES ONE USING LIMA.;  Surgeon: Loreli Slot, MD;  Location: [facility] OR;  Service: Open Heart Surgery;  Laterality: N/A;   FOOT SURGERY     LEFT HEART CATH AND CORONARY ANGIOGRAPHY N/A 02/11/2022   Procedure: LEFT HEART CATH AND CORONARY ANGIOGRAPHY;  Surgeon: Tonny Bollman, MD;  Location: [facility] INVASIVE CV LAB;  Service:  Cardiovascular;  Laterality: N/A;   NASAL SINUS SURGERY     pituitary adenoma resection     SHOULDER SURGERY     TEE WITHOUT CARDIOVERSION N/A 02/13/2022   Procedure: TRANSESOPHAGEAL ECHOCARDIOGRAM (TEE);  Surgeon: Loreli Slot, MD;  Location[facility] OR;  Service: Open Heart Surgery;  Laterality: N/A;   TOTAL HIP ARTHROPLASTY Right    TUBAL LIGATION      MEDICATIONS:  acetaminophen (TYLENOL) 500 MG tablet   Ascorbic Acid 500 MG CAPS   aspirin 81 MG chewable tablet   atorvastatin (LIPITOR) 40 MG tablet   Cholecalciferol (VITAMIN D) 50 MCG (2000 UT) CAPS   Coenzyme Q10 (CO Q-10) 50 MG CAPS   ezetimibe (ZETIA) 10 MG tablet   fluticasone (FLONASE) 50 MCG/ACT nasal spray   folic acid (FOLVITE) 800 MCG tablet   metFORMIN (GLUCOPHAGE-XR) 500 MG 24 hr tablet   metoprolol succinate (TOPROL XL) 25 MG 24 hr tablet   Multiple Vitamins-Minerals (HAIR SKIN & NAILS ADVANCED PO)   NON FORMULARY   omeprazole (PRILOSEC) 20 MG capsule   polyethylene glycol (MIRALAX / GLYCOLAX) 17 g packet   Polyvinyl Alcohol-Povidone (REFRESH OP)   SAMBUCOL BLACK ELDERBERRY PO   TURMERIC PO   No current facility-administered medications for this encounter.   Jodell Cipro Ward, PA-C [facility] Pre-Surgical Testing [phone]

## 2023-08-14 ENCOUNTER — Encounter (HOSPITAL_COMMUNITY): Payer: Self-pay | Admitting: Orthopaedic Surgery

## 2023-08-14 ENCOUNTER — Telehealth: Payer: Self-pay | Admitting: *Deleted

## 2023-08-14 NOTE — Telephone Encounter (Signed)
 Ortho bundle pre-op call completed.

## 2023-08-14 NOTE — Anesthesia Preprocedure Evaluation (Addendum)
 Anesthesia Evaluation  Patient identified by MRN, date of birth, ID band Patient awake    Reviewed: Allergy & Precautions, NPO status , Patient's Chart, lab work & pertinent test results, reviewed documented beta blocker date and time   History of Anesthesia Complications Negative for: history of anesthetic complications  Airway Mallampati: IV  TM Distance: >3 FB Neck ROM: Full    Dental no notable dental hx.    Pulmonary sleep apnea    Pulmonary exam normal        Cardiovascular hypertension, Pt. on home beta blockers and Pt. on medications + CAD and + CABG (2023)  Normal cardiovascular exam  Echo 02/12/2022: EF 60-65%, mild LVH, grade I DD, valves ok    Neuro/Psych   Anxiety     negative neurological ROS     GI/Hepatic Neg liver ROS,GERD  Medicated,,  Endo/Other  diabetes, Type 2, Oral Hypoglycemic Agents    Renal/GU negative Renal ROS  negative genitourinary   Musculoskeletal  (+) Arthritis ,  LEFT HIP OSTEOARTHRITIS   Abdominal   Peds  Hematology negative hematology ROS (+)   Anesthesia Other Findings Day of surgery medications reviewed with patient.  Reproductive/Obstetrics                              Anesthesia Physical Anesthesia Plan  ASA: 3  Anesthesia Plan: Spinal   Post-op Pain Management: Tylenol  PO (pre-op)*   Induction:   PONV Risk Score and Plan: 3 and Treatment may vary due to age or medical condition, Ondansetron , Propofol  infusion and Dexamethasone   Airway Management Planned: Natural Airway and Simple Face Mask  Additional Equipment: None  Intra-op Plan:   Post-operative Plan:   Informed Consent: I have reviewed the patients History and Physical, chart, labs and discussed the procedure including the risks, benefits and alternatives for the proposed anesthesia with the patient or authorized representative who has indicated his/her understanding and  acceptance.       Plan Discussed with: CRNA  Anesthesia Plan Comments:         Anesthesia Quick Evaluation

## 2023-08-14 NOTE — H&P (Signed)
 TOTAL HIP ADMISSION H&P  Patient is admitted for left total hip arthroplasty.  Subjective:  Chief Complaint: left hip pain  HPI: Teresa Marquez, 83 y.o. female, has a history of pain and functional disability in the left hip(s) due to arthritis and patient has failed non-surgical conservative treatments for greater than 12 weeks to include use of assistive devices and activity modification.  Onset of symptoms was gradual starting 2 years ago with gradually worsening course since that time.The patient noted no past surgery on the left hip(s).  Patient currently rates pain in the left hip at 10 out of 10 with activity. Patient has night pain, worsening of pain with activity and weight bearing, trendelenberg gait, pain that interfers with activities of daily living, and pain with passive range of motion. Patient has evidence of subchondral sclerosis, periarticular osteophytes, and joint space narrowing by imaging studies. This condition presents safety issues increasing the risk of falls.  There is no current active infection.  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   Coronary artery disease 02/13/2022   Unstable angina (HCC) 02/11/2022   Elevated blood-pressure reading without diagnosis of hypertension 01/30/2022   Mixed hyperlipidemia 01/30/2022   Cervical radiculopathy 11/12/2016   Gastroesophageal reflux disease without esophagitis 10/01/2016   Obstructive sleep apnea 10/01/2016   Oral cyst 10/01/2016   Chronic rhinitis 08/07/2016   History of pituitary adenoma 08/07/2016   S/P rotator cuff repair 08/29/2015   Incomplete tear of right rotator cuff 08/03/2015   Chronic right shoulder pain 03/16/2015   Knee pain 09/09/2013   History of total hip arthroplasty 03/05/2013   Low back pain 03/05/2013   Past Medical History:  Diagnosis Date   Abdominal pain    Anxiety    Basal cell  carcinoma    Coronary artery disease    DDD (degenerative disc disease), lumbar    Diabetes mellitus without complication (HCC)    DM (diabetes mellitus) (HCC)    Drug-induced myopathy    Elevated blood pressure reading in office without diagnosis of hypertension    GERD (gastroesophageal reflux disease)    Heart murmur    Hyperlipidemia    Menopause    NAFLD (nonalcoholic fatty liver disease)    OSA (obstructive sleep apnea)     Past Surgical History:  Procedure Laterality Date   BREAST EXCISIONAL BIOPSY Left    benign   CARPAL TUNNEL RELEASE     CORONARY ARTERY BYPASS GRAFT N/A 02/13/2022   Procedure: CORONARY ARTERY BYPASS GRAFTING (CABG) TIMES ONE USING LIMA.;  Surgeon: Kerrin Elspeth BROCKS, MD;  Location: [facility] OR;  Service: Open Heart Surgery;  Laterality: N/A;   FOOT SURGERY     LEFT HEART CATH AND CORONARY ANGIOGRAPHY N/A 02/11/2022   Procedure: LEFT HEART CATH AND CORONARY ANGIOGRAPHY;  Surgeon: Wonda Sharper, MD;  Location: [facility]VASIVE CV LAB;  Service: Cardiovascular;  Laterality: N/A;   NASAL SINUS SURGERY     pituitary adenoma resection     SHOULDER SURGERY     TEE WITHOUT CARDIOVERSION N/A 02/13/2022   Procedure: TRANSESOPHAGEAL ECHOCARDIOGRAM (TEE);  Surgeon: Kerrin Elspeth BROCKS, MD;  Location: [facility] OR;  Service: Open Heart Surgery;  Laterality: N/A;   TOTAL HIP ARTHROPLASTY Right    TUBAL LIGATION      No current facility-administered medications for this encounter.   Current Outpatient Medications  Medication Sig Dispense Refill Last Dose/Taking   acetaminophen  (  TYLENOL ) 500 MG tablet Take 500 mg by mouth every 6 (six) hours as needed for moderate pain.   Taking As Needed   Ascorbic Acid  500 MG CAPS Take 500 mg by mouth daily.   Taking   aspirin  81 MG chewable tablet Chew 81 mg by mouth daily.   Taking   atorvastatin  (LIPITOR ) 40 MG tablet Take 1 tablet (40 mg total) by mouth every other day. 45 tablet 3 Taking   Cholecalciferol  (VITAMIN D ) 50 MCG (2000 UT)  CAPS Take 2,000 Units by mouth daily.   Taking   Coenzyme Q10 (CO Q-10) 50 MG CAPS Take 50 mg by mouth daily.   Taking   ezetimibe  (ZETIA ) 10 MG tablet Take 1 tablet (10 mg total) by mouth daily. 90 tablet 2 Taking   fluticasone  (FLONASE ) 50 MCG/ACT nasal spray Place 2 sprays into both nostrils at bedtime.   Taking   folic acid  (FOLVITE ) 800 MCG tablet Take 800 mcg by mouth daily.   Taking   metFORMIN  (GLUCOPHAGE -XR) 500 MG 24 hr tablet Take 1,000 mg by mouth daily.   Taking   metoprolol  succinate (TOPROL  XL) 25 MG 24 hr tablet Take 0.5 tablets (12.5 mg total) by mouth daily. 45 tablet 2 Taking   Multiple Vitamins-Minerals (HAIR SKIN & NAILS ADVANCED PO) Take 1 tablet by mouth daily.   Taking   omeprazole (PRILOSEC) 20 MG capsule Take 10 mg by mouth every evening.   Taking   polyethylene glycol (MIRALAX  / GLYCOLAX ) 17 g packet Take 17 g by mouth daily.   Taking   Polyvinyl Alcohol -Povidone (REFRESH OP) Place 1 drop into both eyes daily as needed (dry eyes).   Taking As Needed   SAMBUCOL BLACK ELDERBERRY PO Take 1 tablet by mouth daily.   Taking   TURMERIC PO Take 800 mg by mouth in the morning and at bedtime.   Taking   NON FORMULARY Pt uses a cpap nightly      Allergies  Allergen Reactions   Bactrim [Sulfamethoxazole-Trimethoprim] Palpitations    Heart racing   Keflet [Cephalexin]     Makes me bleed   Leqvio  [Inclisiran Sodium ]     Reports burning in her feet and increase in A1c   Statins     worsening neuropathy and myalgias on atorvastatin  40mg  every other day, daily, and 80mg  daily, rosuvastatin 20mg  daily, and fluvastatin   Tetracyclines & Related     Yeast infection     Social History   Tobacco Use   Smoking status: Never   Smokeless tobacco: Never  Substance Use Topics   Alcohol  use: Yes    Alcohol /week: 1.0 standard drink of alcohol     Types: 1 Glasses of wine per week    Comment: rare    Family History  Problem Relation Age of Onset   Cancer Mother    Cancer Father       Review of Systems  Objective:  Physical Exam Vitals reviewed.  Constitutional:      Appearance: Normal appearance.  HENT:     Head: Normocephalic and atraumatic.  Eyes:     Extraocular Movements: Extraocular movements intact.     Pupils: Pupils are equal, round, and reactive to light.  Cardiovascular:     Rate and Rhythm: Normal rate.  Pulmonary:     Effort: Pulmonary effort is normal.     Breath sounds: Normal breath sounds.  Abdominal:     Palpations: Abdomen is soft.  Musculoskeletal:     Cervical back: Normal  range of motion and neck supple.     Left hip: Tenderness and bony tenderness present. Decreased range of motion. Decreased strength.  Neurological:     Mental Status: She is alert and oriented to person, place, and time.  Psychiatric:        Behavior: Behavior normal.     Vital signs in last 24 hours:    Labs:   Estimated body mass index is 29.35 kg/m as calculated from the following:   Height as of 08/07/23: 5' 4 (1.626 m).   Weight as of 08/07/23: 77.6 kg.   Imaging Review Plain radiographs demonstrate severe degenerative joint disease of the left hip(s). The bone quality appears to be good for age and reported activity level.      Assessment/Plan:  End stage arthritis, left hip(s)  The patient history, physical examination, clinical judgement of the provider and imaging studies are consistent with end stage degenerative joint disease of the left hip(s) and total hip arthroplasty is deemed medically necessary. The treatment options including medical management, injection therapy, arthroscopy and arthroplasty were discussed at length. The risks and benefits of total hip arthroplasty were presented and reviewed. The risks due to aseptic loosening, infection, stiffness, dislocation/subluxation,  thromboembolic complications and other imponderables were discussed.  The patient acknowledged the explanation, agreed to proceed with the plan and consent  was signed. Patient is being admitted for inpatient treatment for surgery, pain control, PT, OT, prophylactic antibiotics, VTE prophylaxis, progressive ambulation and ADL's and discharge planning.The patient is planning to be discharged home with home health services

## 2023-08-14 NOTE — Care Plan (Signed)
 [facility] RNCM call to patient to discuss her upcoming Left total hip arthroplasty with Dr. Vernetta on 08/15/23 at [facility]. She is agreeable to case management. She lives alone, but will have her son with her for first few nights and then family to assist as needed at home after discharge. She does have a RW. No DME needed. Anticipate HHPT will be needed after a short hospital stay. Referral made to [facility] after choice provided. Reviewed post op care instructions. Will continue to follow for needs.

## 2023-08-15 ENCOUNTER — Encounter (HOSPITAL_COMMUNITY): Payer: Self-pay | Admitting: Orthopaedic Surgery

## 2023-08-15 ENCOUNTER — Observation Stay (HOSPITAL_COMMUNITY)
Admission: RE | Admit: 2023-08-15 | Discharge: 2023-08-16 | Disposition: A | Discharge status: Home / Self Care | Payer: Medicare Other | Attending: Orthopaedic Surgery | Admitting: Orthopaedic Surgery

## 2023-08-15 ENCOUNTER — Other Ambulatory Visit: Payer: Self-pay

## 2023-08-15 ENCOUNTER — Encounter (HOSPITAL_COMMUNITY)
Admission: RE | Disposition: A | Discharge status: Home / Self Care | Payer: Self-pay | Source: Home / Self Care | Attending: Orthopaedic Surgery

## 2023-08-15 ENCOUNTER — Observation Stay (HOSPITAL_COMMUNITY): Payer: Medicare Other

## 2023-08-15 ENCOUNTER — Ambulatory Visit (HOSPITAL_BASED_OUTPATIENT_CLINIC_OR_DEPARTMENT_OTHER): Payer: Medicare Other | Admitting: Certified Registered"

## 2023-08-15 ENCOUNTER — Ambulatory Visit (HOSPITAL_COMMUNITY): Payer: Medicare Other

## 2023-08-15 ENCOUNTER — Ambulatory Visit (HOSPITAL_COMMUNITY): Payer: Medicare Other | Admitting: Physician Assistant

## 2023-08-15 DIAGNOSIS — Z96641 Presence of right artificial hip joint: Secondary | ICD-10-CM | POA: Diagnosis not present

## 2023-08-15 DIAGNOSIS — Z79899 Other long term (current) drug therapy: Secondary | ICD-10-CM | POA: Insufficient documentation

## 2023-08-15 DIAGNOSIS — Z7984 Long term (current) use of oral hypoglycemic drugs: Secondary | ICD-10-CM | POA: Insufficient documentation

## 2023-08-15 DIAGNOSIS — G4733 Obstructive sleep apnea (adult) (pediatric): Secondary | ICD-10-CM | POA: Diagnosis not present

## 2023-08-15 DIAGNOSIS — Z951 Presence of aortocoronary bypass graft: Secondary | ICD-10-CM | POA: Insufficient documentation

## 2023-08-15 DIAGNOSIS — Z85828 Personal history of other malignant neoplasm of skin: Secondary | ICD-10-CM | POA: Insufficient documentation

## 2023-08-15 DIAGNOSIS — I10 Essential (primary) hypertension: Secondary | ICD-10-CM

## 2023-08-15 DIAGNOSIS — Z96642 Presence of left artificial hip joint: Secondary | ICD-10-CM

## 2023-08-15 DIAGNOSIS — E119 Type 2 diabetes mellitus without complications: Secondary | ICD-10-CM | POA: Diagnosis not present

## 2023-08-15 DIAGNOSIS — Z7982 Long term (current) use of aspirin: Secondary | ICD-10-CM | POA: Insufficient documentation

## 2023-08-15 DIAGNOSIS — M1612 Unilateral primary osteoarthritis, left hip: Secondary | ICD-10-CM | POA: Diagnosis not present

## 2023-08-15 DIAGNOSIS — I2510 Atherosclerotic heart disease of native coronary artery without angina pectoris: Secondary | ICD-10-CM | POA: Insufficient documentation

## 2023-08-15 DIAGNOSIS — Z471 Aftercare following joint replacement surgery: Secondary | ICD-10-CM | POA: Diagnosis not present

## 2023-08-15 DIAGNOSIS — Z96643 Presence of artificial hip joint, bilateral: Secondary | ICD-10-CM | POA: Diagnosis not present

## 2023-08-15 DIAGNOSIS — I25110 Atherosclerotic heart disease of native coronary artery with unstable angina pectoris: Secondary | ICD-10-CM

## 2023-08-15 HISTORY — PX: TOTAL HIP ARTHROPLASTY: SHX124

## 2023-08-15 LAB — GLUCOSE, CAPILLARY
Glucose-Capillary: 120 mg/dL — ABNORMAL HIGH (ref 70–99)
Glucose-Capillary: 137 mg/dL — ABNORMAL HIGH (ref 70–99)

## 2023-08-15 LAB — TYPE AND SCREEN
ABO/RH(D): A POS
Antibody Screen: NEGATIVE

## 2023-08-15 SURGERY — ARTHROPLASTY, HIP, TOTAL, ANTERIOR APPROACH
Anesthesia: Spinal | Site: Hip | Laterality: Left

## 2023-08-15 MED ORDER — DEXAMETHASONE SODIUM PHOSPHATE 10 MG/ML IJ SOLN
INTRAMUSCULAR | Status: AC
  Filled 2023-08-15: qty 1

## 2023-08-15 MED ORDER — ACETAMINOPHEN 325 MG PO TABS: 325.0000 mg | ORAL_TABLET | Freq: Four times a day (QID) | ORAL | Status: DC | PRN

## 2023-08-15 MED ORDER — POVIDONE-IODINE 10 % EX SWAB: 2.0000 | Freq: Once | CUTANEOUS | Status: DC

## 2023-08-15 MED ORDER — ASPIRIN 81 MG PO CHEW
81.0000 mg | CHEWABLE_TABLET | Freq: Two times a day (BID) | ORAL | Status: DC
  Administered 2023-08-15 – 2023-08-16 (×2): 81 mg via ORAL
  Filled 2023-08-15 (×2): qty 1

## 2023-08-15 MED ORDER — PROPOFOL 1000 MG/100ML IV EMUL
INTRAVENOUS | Status: AC
  Filled 2023-08-15: qty 100

## 2023-08-15 MED ORDER — HYDROCODONE-ACETAMINOPHEN 7.5-325 MG PO TABS: 1.0000 | ORAL_TABLET | ORAL | Status: DC | PRN

## 2023-08-15 MED ORDER — LEVOFLOXACIN IN D5W 500 MG/100ML IV SOLN
500.0000 mg | INTRAVENOUS | Status: AC
  Administered 2023-08-15: 500 mg via INTRAVENOUS
  Filled 2023-08-15: qty 100

## 2023-08-15 MED ORDER — FENTANYL CITRATE (PF) 100 MCG/2ML IJ SOLN
INTRAMUSCULAR | Status: DC | PRN
  Administered 2023-08-15 (×2): 25 ug via INTRAVENOUS

## 2023-08-15 MED ORDER — PROPOFOL 10 MG/ML IV BOLUS
INTRAVENOUS | Status: DC | PRN
  Administered 2023-08-15: 20 mg via INTRAVENOUS

## 2023-08-15 MED ORDER — PHENYLEPHRINE HCL-NACL 20-0.9 MG/250ML-% IV SOLN
INTRAVENOUS | Status: DC | PRN
  Administered 2023-08-15: 20 ug/min via INTRAVENOUS

## 2023-08-15 MED ORDER — ALUM & MAG HYDROXIDE-SIMETH 200-200-20 MG/5ML PO SUSP: 30.0000 mL | ORAL | Status: DC | PRN

## 2023-08-15 MED ORDER — PROPOFOL 500 MG/50ML IV EMUL
INTRAVENOUS | Status: DC | PRN
  Administered 2023-08-15: 100 ug/kg/min via INTRAVENOUS

## 2023-08-15 MED ORDER — OXYCODONE HCL 5 MG/5ML PO SOLN: 5.0000 mg | Freq: Once | ORAL | Status: DC | PRN

## 2023-08-15 MED ORDER — METHOCARBAMOL 500 MG PO TABS
500.0000 mg | ORAL_TABLET | Freq: Four times a day (QID) | ORAL | Status: DC | PRN
  Administered 2023-08-15 – 2023-08-16 (×2): 500 mg via ORAL
  Filled 2023-08-15 (×2): qty 1

## 2023-08-15 MED ORDER — METHOCARBAMOL 1000 MG/10ML IJ SOLN: 500.0000 mg | Freq: Four times a day (QID) | INTRAMUSCULAR | Status: DC | PRN

## 2023-08-15 MED ORDER — FENTANYL CITRATE (PF) 100 MCG/2ML IJ SOLN
INTRAMUSCULAR | Status: AC
  Filled 2023-08-15: qty 2

## 2023-08-15 MED ORDER — PROPOFOL 10 MG/ML IV BOLUS
INTRAVENOUS | Status: AC
  Filled 2023-08-15: qty 20

## 2023-08-15 MED ORDER — METOCLOPRAMIDE HCL 5 MG PO TABS
5.0000 mg | ORAL_TABLET | Freq: Three times a day (TID) | ORAL | Status: DC | PRN
Start: 2023-08-15 — End: 2023-08-16

## 2023-08-15 MED ORDER — ACETAMINOPHEN 325 MG PO TABS
325.0000 mg | ORAL_TABLET | Freq: Four times a day (QID) | ORAL | Status: DC | PRN
Start: 2023-08-15 — End: 2023-08-16
  Administered 2023-08-15: 650 mg via ORAL
  Filled 2023-08-15: qty 2

## 2023-08-15 MED ORDER — EZETIMIBE 10 MG PO TABS
10.0000 mg | ORAL_TABLET | Freq: Every day | ORAL | Status: DC
  Administered 2023-08-15 – 2023-08-16 (×2): 10 mg via ORAL
  Filled 2023-08-15 (×2): qty 1

## 2023-08-15 MED ORDER — INSULIN ASPART 100 UNIT/ML IJ SOLN: 0.0000 [IU] | INTRAMUSCULAR | Status: DC | PRN

## 2023-08-15 MED ORDER — 0.9 % SODIUM CHLORIDE (POUR BTL) OPTIME
TOPICAL | Status: DC | PRN
  Administered 2023-08-15: 1000 mL

## 2023-08-15 MED ORDER — LIDOCAINE 2% (20 MG/ML) 5 ML SYRINGE
INTRAMUSCULAR | Status: DC | PRN
  Administered 2023-08-15: 40 mg via INTRAVENOUS

## 2023-08-15 MED ORDER — DOCUSATE SODIUM 100 MG PO CAPS
100.0000 mg | ORAL_CAPSULE | Freq: Two times a day (BID) | ORAL | Status: DC
  Administered 2023-08-15 – 2023-08-16 (×2): 100 mg via ORAL
  Filled 2023-08-15 (×2): qty 1

## 2023-08-15 MED ORDER — METFORMIN HCL ER 500 MG PO TB24
1000.0000 mg | ORAL_TABLET | Freq: Every day | ORAL | Status: DC
  Administered 2023-08-15: 1000 mg via ORAL
  Filled 2023-08-15: qty 2

## 2023-08-15 MED ORDER — OXYCODONE HCL 5 MG PO TABS: 5.0000 mg | ORAL_TABLET | Freq: Once | ORAL | Status: DC | PRN

## 2023-08-15 MED ORDER — DEXAMETHASONE SODIUM PHOSPHATE 10 MG/ML IJ SOLN
INTRAMUSCULAR | Status: DC | PRN
Start: 2023-08-15 — End: 2023-08-15
  Administered 2023-08-15: 8 mg via INTRAVENOUS

## 2023-08-15 MED ORDER — ORAL CARE MOUTH RINSE: 15.0000 mL | Freq: Once | OROMUCOSAL | Status: AC

## 2023-08-15 MED ORDER — PHENOL 1.4 % MT LIQD: 1.0000 | OROMUCOSAL | Status: DC | PRN

## 2023-08-15 MED ORDER — LIDOCAINE HCL (PF) 2 % IJ SOLN
INTRAMUSCULAR | Status: AC
  Filled 2023-08-15: qty 5

## 2023-08-15 MED ORDER — CHLORHEXIDINE GLUCONATE 0.12 % MT SOLN
15.0000 mL | Freq: Once | OROMUCOSAL | Status: AC
  Administered 2023-08-15: 15 mL via OROMUCOSAL

## 2023-08-15 MED ORDER — BUPIVACAINE IN DEXTROSE 0.75-8.25 % IT SOLN
INTRATHECAL | Status: DC | PRN
  Administered 2023-08-15: 1.6 mL via INTRATHECAL

## 2023-08-15 MED ORDER — SODIUM CHLORIDE 0.9 % IR SOLN
Status: DC | PRN
  Administered 2023-08-15: 1000 mL

## 2023-08-15 MED ORDER — MENTHOL 3 MG MT LOZG: 1.0000 | LOZENGE | OROMUCOSAL | Status: DC | PRN

## 2023-08-15 MED ORDER — POLYETHYLENE GLYCOL 3350 17 G PO PACK
17.0000 g | PACK | Freq: Every day | ORAL | Status: DC | PRN
Start: 2023-08-15 — End: 2023-08-16

## 2023-08-15 MED ORDER — METOCLOPRAMIDE HCL 5 MG/ML IJ SOLN
5.0000 mg | Freq: Three times a day (TID) | INTRAMUSCULAR | Status: DC | PRN
Start: 2023-08-15 — End: 2023-08-16

## 2023-08-15 MED ORDER — SODIUM CHLORIDE 0.9 % IV SOLN: INTRAVENOUS | Status: DC

## 2023-08-15 MED ORDER — VITAMIN D 25 MCG (1000 UNIT) PO TABS
2000.0000 [IU] | ORAL_TABLET | Freq: Every day | ORAL | Status: DC
  Administered 2023-08-15 – 2023-08-16 (×2): 2000 [IU] via ORAL
  Filled 2023-08-15 (×2): qty 2

## 2023-08-15 MED ORDER — FOLIC ACID 800 MCG PO TABS
800.0000 ug | ORAL_TABLET | Freq: Every day | ORAL | Status: DC
Start: 2023-08-15 — End: 2023-08-15

## 2023-08-15 MED ORDER — PANTOPRAZOLE SODIUM 40 MG PO TBEC
40.0000 mg | DELAYED_RELEASE_TABLET | Freq: Every day | ORAL | Status: DC
  Administered 2023-08-15 – 2023-08-16 (×2): 40 mg via ORAL
  Filled 2023-08-15 (×2): qty 1

## 2023-08-15 MED ORDER — FOLIC ACID 1 MG PO TABS
1.0000 mg | ORAL_TABLET | Freq: Every day | ORAL | Status: DC
  Administered 2023-08-15 – 2023-08-16 (×2): 1 mg via ORAL
  Filled 2023-08-15 (×2): qty 1

## 2023-08-15 MED ORDER — LACTATED RINGERS IV SOLN: INTRAVENOUS | Status: DC

## 2023-08-15 MED ORDER — TRANEXAMIC ACID-NACL 1000-0.7 MG/100ML-% IV SOLN
1000.0000 mg | INTRAVENOUS | Status: AC
  Administered 2023-08-15: 1000 mg via INTRAVENOUS
  Filled 2023-08-15: qty 100

## 2023-08-15 MED ORDER — ONDANSETRON HCL 4 MG/2ML IJ SOLN: 4.0000 mg | Freq: Four times a day (QID) | INTRAMUSCULAR | Status: DC | PRN

## 2023-08-15 MED ORDER — DIPHENHYDRAMINE HCL 12.5 MG/5ML PO ELIX
12.5000 mg | ORAL_SOLUTION | ORAL | Status: DC | PRN
Start: 2023-08-15 — End: 2023-08-16

## 2023-08-15 MED ORDER — VANCOMYCIN HCL IN DEXTROSE 1-5 GM/200ML-% IV SOLN
1000.0000 mg | Freq: Two times a day (BID) | INTRAVENOUS | Status: AC
  Administered 2023-08-15: 1000 mg via INTRAVENOUS
  Filled 2023-08-15: qty 200

## 2023-08-15 MED ORDER — MORPHINE SULFATE (PF) 2 MG/ML IV SOLN: 0.5000 mg | INTRAVENOUS | Status: DC | PRN

## 2023-08-15 MED ORDER — VANCOMYCIN HCL IN DEXTROSE 1-5 GM/200ML-% IV SOLN
1000.0000 mg | INTRAVENOUS | Status: AC
  Administered 2023-08-15: 1000 mg via INTRAVENOUS
  Filled 2023-08-15: qty 200

## 2023-08-15 MED ORDER — FENTANYL CITRATE PF 50 MCG/ML IJ SOSY: 25.0000 ug | PREFILLED_SYRINGE | INTRAMUSCULAR | Status: DC | PRN

## 2023-08-15 MED ORDER — HYDROCODONE-ACETAMINOPHEN 5-325 MG PO TABS
1.0000 | ORAL_TABLET | ORAL | Status: DC | PRN
Start: 2023-08-15 — End: 2023-08-16
  Administered 2023-08-15 – 2023-08-16 (×3): 1 via ORAL
  Filled 2023-08-15 (×3): qty 1

## 2023-08-15 MED ORDER — METOPROLOL SUCCINATE ER 25 MG PO TB24
12.5000 mg | ORAL_TABLET | Freq: Every day | ORAL | Status: DC
Start: 2023-08-15 — End: 2023-08-16
  Administered 2023-08-15: 12.5 mg via ORAL
  Filled 2023-08-15: qty 1

## 2023-08-15 MED ORDER — ONDANSETRON HCL 4 MG/2ML IJ SOLN
INTRAMUSCULAR | Status: DC | PRN
  Administered 2023-08-15: 4 mg via INTRAVENOUS

## 2023-08-15 MED ORDER — ONDANSETRON HCL 4 MG PO TABS: 4.0000 mg | ORAL_TABLET | Freq: Four times a day (QID) | ORAL | Status: DC | PRN

## 2023-08-15 MED ORDER — ONDANSETRON HCL 4 MG/2ML IJ SOLN
INTRAMUSCULAR | Status: AC
  Filled 2023-08-15: qty 2

## 2023-08-15 MED ORDER — VITAMIN C 500 MG PO TABS
500.0000 mg | ORAL_TABLET | Freq: Every day | ORAL | Status: DC
  Administered 2023-08-15 – 2023-08-16 (×2): 500 mg via ORAL
  Filled 2023-08-15 (×2): qty 1

## 2023-08-15 MED ORDER — ACETAMINOPHEN 500 MG PO TABS
ORAL_TABLET | ORAL | Status: AC
  Filled 2023-08-15: qty 2

## 2023-08-15 SURGICAL SUPPLY — 37 items
BAG COUNTER SPONGE SURGICOUNT (BAG) ×1 IMPLANT
BAG ZIPLOCK 12X15 (MISCELLANEOUS) IMPLANT
BENZOIN TINCTURE PRP APPL 2/3 (GAUZE/BANDAGES/DRESSINGS) IMPLANT
BLADE SAW SGTL 18X1.27X75 (BLADE) ×1 IMPLANT
COVER PERINEAL POST (MISCELLANEOUS) ×1 IMPLANT
COVER SURGICAL LIGHT HANDLE (MISCELLANEOUS) ×1 IMPLANT
DRAPE FOOT SWITCH (DRAPES) ×1 IMPLANT
DRAPE STERI IOBAN 125X83 (DRAPES) ×1 IMPLANT
DRAPE U-SHAPE 47X51 STRL (DRAPES) ×2 IMPLANT
DRSG AQUACEL AG ADV 3.5X10 (GAUZE/BANDAGES/DRESSINGS) ×1 IMPLANT
DURAPREP 26ML APPLICATOR (WOUND CARE) ×1 IMPLANT
ELECT REM PT RETURN 15FT ADLT (MISCELLANEOUS) ×1 IMPLANT
GAUZE XEROFORM 1X8 LF (GAUZE/BANDAGES/DRESSINGS) IMPLANT
GLOVE BIO SURGEON STRL SZ7.5 (GLOVE) ×1 IMPLANT
GLOVE BIOGEL PI IND STRL 8 (GLOVE) ×2 IMPLANT
GLOVE ECLIPSE 8.0 STRL XLNG CF (GLOVE) ×1 IMPLANT
GOWN STRL REUS W/ TWL XL LVL3 (GOWN DISPOSABLE) ×2 IMPLANT
HEAD M SROM 36MM 2 (Hips) IMPLANT
HOLDER FOLEY CATH W/STRAP (MISCELLANEOUS) ×1 IMPLANT
KIT TURNOVER KIT A (KITS) IMPLANT
LINER NEUTRAL 52X36MM PLUS 4 (Liner) IMPLANT
PACK ANTERIOR HIP CUSTOM (KITS) ×1 IMPLANT
PIN SECTOR W/GRIP ACE CUP 52MM (Hips) IMPLANT
SET HNDPC FAN SPRY TIP SCT (DISPOSABLE) ×1 IMPLANT
SROM M HEAD 36MM 2 (Hips) ×1 IMPLANT
STAPLER SKIN PROX WIDE 3.9 (STAPLE) IMPLANT
STEM FEM ACTIS HIGH SZ3 (Stem) IMPLANT
STRIP CLOSURE SKIN 1/2X4 (GAUZE/BANDAGES/DRESSINGS) IMPLANT
SUT ETHIBOND NAB CT1 #1 30IN (SUTURE) ×1 IMPLANT
SUT ETHILON 2 0 PS N (SUTURE) IMPLANT
SUT MNCRL AB 4-0 PS2 18 (SUTURE) IMPLANT
SUT VIC AB 0 CT1 36 (SUTURE) ×1 IMPLANT
SUT VIC AB 1 CT1 36 (SUTURE) ×1 IMPLANT
SUT VIC AB 2-0 CT1 TAPERPNT 27 (SUTURE) ×2 IMPLANT
TRAY FOLEY MTR SLVR 14FR STAT (SET/KITS/TRAYS/PACK) IMPLANT
TRAY FOLEY MTR SLVR 16FR STAT (SET/KITS/TRAYS/PACK) IMPLANT
YANKAUER SUCT BULB TIP NO VENT (SUCTIONS) ×1 IMPLANT

## 2023-08-15 NOTE — Progress Notes (Signed)
   08/15/23 2304  BiPAP/CPAP/SIPAP  $ Non-Invasive Home Ventilator  Initial  BiPAP/CPAP/SIPAP Pt Type Adult  BiPAP/CPAP/SIPAP V60  Mask Type Nasal pillows  Respiratory Rate 18 breaths/min  Patient Home Equipment No  Auto Titrate Yes (7-20 cmH2O)

## 2023-08-15 NOTE — Op Note (Signed)
 Operative Note  Date of operation: 08/15/2023 Preoperative diagnosis: Left hip primary osteoarthritis Postoperative diagnosis: Same  Procedure: Left direct anterior total hip arthroplasty  Implants: DePuy Implant Name Type Inv. Item Serial No. Manufacturer Lot No. LRB No. Used Action  PIN SECTOR W/GRIP ACE CUP - [id number] Hips PIN SECTOR W/GRIP ACE CUP  DEPUY [employer] [id number] Left 1 Implanted  LINER NEUTRAL [id number] PLUS 4 - [id number] Liner LINER NEUTRAL [id number] PLUS 4  DEPUY ORTHOPA[employer]ICS [id number] Left 1 Implanted  STEM FEM ACTIS HIGH SZ3 - [id number] Stem STEM FEM ACTIS HIGH SZ3  DEPUY ORTHOPAEDICS [id number] Left 1 Implanted  SROM M HEAD 2 - [id number] Hips SROM M HEAD 2  DEPUY ORTHOPA[employer]ICS [id number] Left 1 Implanted   Surgeon: Lonni GRADE. Vernetta, MD Assistant: Tory Gaskins, PA-C  Anesthesia: General Antibiotics: IV vancomycin  and IV Levaquin  EBL: 50 cc Complications: None  Indications: The patient is a very pleasant 83 year old female with debilitating arthritis involving her left hip.  This is been well-documented with clinical exam and x-ray findings.  At this point her left hip pain is daily and it is detrimentally affecting her mobility, her quality of life and her activities daily living.  She does have a remote history of a right total hip arthroplasty done elsewhere.  At this point she does risk-free with a total hip arthroplasty on the left side.  Having had this before in the right side she is fully aware of the risks of acute blood loss anemia, nerve or vessel injury, fracture, infection, DVT, dislocation, implant failure, leg length differences and wound healing issues.  She understands that our goals are hopefully decrease pain, improve mobility and improve quality of life.  Procedure description: After informed consent was obtained and the appropriate left hip was marked, the patient was brought the operating room and set up on the stretcher where  spinal anesthesia was obtained.  She was then laid in supine position on the stretcher and a Foley catheter was placed.  Traction boots were placed on both her feet and neck she was placed supine on the Hana fracture table with a perineal post and placed in both legs and inline skeletal traction devices but no traction applied.  Her left operative hip and pelvis were assessed radiographically.  The left hip was prepped and draped with DuraPrep and sterile drapes.  A timeout was called and she was identified as correct patient the correct left hip.  An incision was then made just inferior and posterior to the ASIS and carried slightly obliquely down the leg.  Dissection was carried down to the tensor fascia lata muscle and the tensor fascia was then divided longitudinally to proceed with a direct anterior process of the hip.  Circumflex vessels were identified and cauterized.  The hip capsule was identified and opened up in L-type format finding a moderate joint effusion.  Cobra retractors were placed around the medial lateral femoral neck and a femoral neck cut was made with an oscillating saw just proximal to the lesser trochanter.  This cut was completed with an osteotome.  A corkscrew guide was placed in the femoral head and the femoral head was removed its entirety and there was a wide area devoid of cartilage.  A bent Hohmann was then placed over the medial acetabular rim and remnants of the acetabular labrum and other debris removed.  Reaming was then initiated under direct visualization from a size 43 reamer and stepwise increments going up to a size 51  reamer with all reamers placed under direct visualization and the last reamer also placed under direct fluoroscopy in order to obtain the depth and reaming, the inclination and the anteversion.  The real DePuy sector GRIPTION acetabular component was then placed without difficulty followed by a 36+4 polythene liner.  Attention was then turned the femur.  With  the left leg externally rotated to 120 degrees, extended and adducted, a Mueller retractor was placed medially and Hohmann tract about the greater trochanter.  The lateral joint capsule was released and a box cutting osteotome was used in her femoral canal.  Broaching was then initiated using the Actis broaching system from a size 0 going to a size 3.  With a size 3 in place we trialed a high offset femoral neck and based on a higher neck cut we will with a 36-2 trial hip ball.  This reduced the pelvis and we had increased her leg length but we are pleased with stability and offset.  We dislocated the hip and removed the trial components.  We placed the real Actis femoral component with high offset size 3 and the real 36-2 metal hip ball.  Again this was reduced in the pelvis and we are pleased with the overall stability and radiographic assessment.  The soft tissue was then irrigated normal saline solution.  Remnants of the joint capsule were closed with interrupted #1 Ethibond suture followed by a #1 Vicryl to close the tensor fascia.  0 Vicryl was used to close deep tissue and 2-0 Vicryl was used to close subcutaneous tissue.  The skin was closed with staples.  An Aquacel dressing was applied.  The patient was taken off of the Hana table and taken to the recovery room.  Tory Gaskins, PA-C did assist during the entire case and beginning to end and his assistance was crucial and medically necessary for soft tissue management and retraction, helping guide implant placement and a layered closure of the wound.

## 2023-08-15 NOTE — Transfer of Care (Signed)
 Immediate Anesthesia Transfer of Care Note  Patient: Teresa Marquez  Procedure(s) Performed: LEFT TOTAL HIP ARTHROPLASTY ANTERIOR APPROACH (Left: Hip)  Patient Location: PACU  Anesthesia Type:Spinal  Level of Consciousness: drowsy and responds to stimulation  Airway & Oxygen Therapy: Patient Spontanous Breathing and Patient connected to face mask oxygen  Post-op Assessment: Report given to RN and Post -op Vital signs reviewed and stable  Post vital signs: Reviewed and stable  Last Vitals:  Vitals Value Taken Time  BP 91/47 08/15/23 1112  Temp    Pulse 63 08/15/23 1113  Resp 14 08/15/23 1113  SpO2 98 % 08/15/23 1113  Vitals shown include unfiled device data.  Last Pain:  Vitals:   08/15/23 0818  TempSrc:   PainSc: 0-No pain         Complications: No notable events documented.

## 2023-08-15 NOTE — Anesthesia Procedure Notes (Signed)
 Spinal  Patient location during procedure: OR Start time: 08/15/2023 9:49 AM Reason for block: surgical anesthesia Staffing Performed: resident/CRNA  Anesthesiologist: Paul Lamarr BRAVO, MD Resident/CRNA: Metta Andrea NOVAK, CRNA Performed by: Metta Andrea NOVAK, CRNA Authorized by: Paul Lamarr BRAVO, MD   Preanesthetic Checklist Completed: patient identified, IV checked, site marked, risks and benefits discussed, surgical consent, monitors and equipment checked, pre-op evaluation and timeout performed Spinal Block Patient position: sitting Prep: DuraPrep and site prepped and draped Patient monitoring: continuous pulse ox, blood pressure, heart rate and cardiac monitor Approach: midline Location: L3-4 Injection technique: single-shot Needle Needle type: Pencan  Needle gauge: 24 G Needle length: 10 cm Assessment Events: CSF return Additional Notes Pt placed in sitting position, spinal kit expiration date checked and verified, timeout performed, + CSF, - heme, pt tolerated well. Dr Paul present and supervising throughout SAB placement. Adequate sensory level.

## 2023-08-15 NOTE — Anesthesia Procedure Notes (Signed)
 Procedure Name: MAC Date/Time: 08/15/2023 9:45 AM  Performed by: Metta Andrea NOVAK, CRNAPre-anesthesia Checklist: Patient identified, Emergency Drugs available, Suction available, Patient being monitored and Timeout performed Oxygen Delivery Method: Simple face mask Placement Confirmation: positive ETCO2

## 2023-08-15 NOTE — Anesthesia Postprocedure Evaluation (Signed)
 Anesthesia Post Note  Patient: Teresa Marquez  Procedure(s) Performed: LEFT TOTAL HIP ARTHROPLASTY ANTERIOR APPROACH (Left: Hip)     Patient location during evaluation: PACU Anesthesia Type: Spinal Level of consciousness: oriented and awake and alert Pain management: pain level controlled Vital Signs Assessment: post-procedure vital signs reviewed and stable Respiratory status: spontaneous breathing, respiratory function stable and patient connected to nasal cannula oxygen Cardiovascular status: blood pressure returned to baseline and stable Postop Assessment: no headache, no backache and no apparent nausea or vomiting Anesthetic complications: no   No notable events documented.  Last Vitals:  Vitals:   08/15/23 1315 08/15/23 1400  BP: (!) 119/58 116/68  Pulse: (!) 53 (!) 57  Resp: 12 13  Temp:    SpO2: 100% 100%    Last Pain:  Vitals:   08/15/23 1400  TempSrc:   PainSc: 0-No pain    LLE Motor Response: Purposeful movement (08/15/23 1400)   RLE Motor Response: Purposeful movement (08/15/23 1400)   L Sensory Level: L5-Outer lower leg, top of foot, great toe (08/15/23 1400) R Sensory Level: L5-Outer lower leg, top of foot, great toe (08/15/23 1400)  Vertell Row

## 2023-08-15 NOTE — Interval H&P Note (Signed)
 History and Physical Interval Note: The patient understands that she is here today for a left total hip replacement to treat her significant left hip pain and arthritis.  There has been no acute or interval change in her medical status.  The risks and benefits of surgery have been discussed in detail and informed consent has been obtained.  The left operative hip has been marked.  08/15/2023 8:30 AM  Teresa Marquez  has presented today for surgery, with the diagnosis of LEFT HIP OSTEOARTHRITIS.  The various methods of treatment have been discussed with the patient and family. After consideration of risks, benefits and other options for treatment, the patient has consented to  Procedure(s): LEFT TOTAL HIP ARTHROPLASTY ANTERIOR APPROACH (Left) as a surgical intervention.  The patient's history has been reviewed, patient examined, no change in status, stable for surgery.  I have reviewed the patient's chart and labs.  Questions were answered to the patient's satisfaction.     [name]

## 2023-08-15 NOTE — Plan of Care (Signed)
 Problem: Education: Goal: Knowledge of General Education information will improve Description: Including pain rating scale, medication(s)/side effects and non-pharmacologic comfort measures Outcome: Progressing   Problem: Health Behavior/Discharge Planning: Goal: Ability to manage health-related needs will improve Outcome: Progressing   Problem: Clinical Measurements: Goal: Ability to maintain clinical measurements within normal limits will improve Outcome: Progressing  Problem: Coping: Goal: Level of anxiety will decrease Outcome: Progressing  Problem: Pain Managment: Goal: General experience of comfort will improve and/or be controlled Outcome: Progressing   Problem: Safety: Goal: Ability to remain free from injury will improve Outcome: Progressing   Jon LULLA Reins, RN 08/15/23 7:04 PM

## 2023-08-15 NOTE — Discharge Instructions (Signed)

## 2023-08-16 DIAGNOSIS — Z96641 Presence of right artificial hip joint: Secondary | ICD-10-CM | POA: Diagnosis not present

## 2023-08-16 DIAGNOSIS — M1612 Unilateral primary osteoarthritis, left hip: Secondary | ICD-10-CM | POA: Diagnosis not present

## 2023-08-16 DIAGNOSIS — E119 Type 2 diabetes mellitus without complications: Secondary | ICD-10-CM | POA: Diagnosis not present

## 2023-08-16 DIAGNOSIS — Z85828 Personal history of other malignant neoplasm of skin: Secondary | ICD-10-CM | POA: Diagnosis not present

## 2023-08-16 DIAGNOSIS — I2510 Atherosclerotic heart disease of native coronary artery without angina pectoris: Secondary | ICD-10-CM | POA: Diagnosis not present

## 2023-08-16 DIAGNOSIS — Z951 Presence of aortocoronary bypass graft: Secondary | ICD-10-CM | POA: Diagnosis not present

## 2023-08-16 LAB — CBC
HCT: 32.9 % — ABNORMAL LOW (ref 36.0–46.0)
Hemoglobin: 11.1 g/dL — ABNORMAL LOW (ref 12.0–15.0)
MCH: 32 pg (ref 26.0–34.0)
MCHC: 33.7 g/dL (ref 30.0–36.0)
MCV: 94.8 fL (ref 80.0–100.0)
Platelets: 277 10*3/uL (ref 150–400)
RBC: 3.47 MIL/uL — ABNORMAL LOW (ref 3.87–5.11)
RDW: 12.2 % (ref 11.5–15.5)
WBC: 14.6 10*3/uL — ABNORMAL HIGH (ref 4.0–10.5)
nRBC: 0 % (ref 0.0–0.2)

## 2023-08-16 LAB — BASIC METABOLIC PANEL
Anion gap: 10 (ref 5–15)
BUN: 15 mg/dL (ref 8–23)
CO2: 22 mmol/L (ref 22–32)
Calcium: 8.2 mg/dL — ABNORMAL LOW (ref 8.9–10.3)
Chloride: 98 mmol/L (ref 98–111)
Creatinine, Ser: 0.66 mg/dL (ref 0.44–1.00)
GFR, Estimated: >60 mL/min (ref >60)
Glucose, Bld: 171 mg/dL — ABNORMAL HIGH (ref 70–99)
Potassium: 4.4 mmol/L (ref 3.5–5.1)
Sodium: 130 mmol/L — ABNORMAL LOW (ref 135–145)

## 2023-08-16 MED ORDER — METHOCARBAMOL 500 MG PO TABS: 500.0000 mg | ORAL_TABLET | Freq: Four times a day (QID) | ORAL | 1 refills | Status: DC | PRN

## 2023-08-16 MED ORDER — HYDROCODONE-ACETAMINOPHEN 5-325 MG PO TABS: 1.0000 | ORAL_TABLET | Freq: Four times a day (QID) | ORAL | 0 refills | Status: DC | PRN

## 2023-08-16 MED ORDER — ASPIRIN 81 MG PO CHEW: 81.0000 mg | CHEWABLE_TABLET | Freq: Two times a day (BID) | ORAL | 0 refills | Status: AC

## 2023-08-16 NOTE — Progress Notes (Signed)
 Subjective: 1 Day Post-Op Procedure(s) (LRB): LEFT TOTAL HIP ARTHROPLASTY ANTERIOR APPROACH (Left) Patient reports pain as moderate.  Has not been up with PT yet.  Objective: Vital signs in last 24 hours: Temp:  [97.5 F (36.4 C)-98.4 F (36.9 C)] 98.4 F (36.9 C) (02/08 0953) Pulse Rate:  [53-82] 74 (02/08 0953) Resp:  [11-19] 18 (02/08 0953) BP: (91-159)/(47-91) 113/55 (02/08 0953) SpO2:  [97 %-100 %] 97 % (02/08 0953)  Intake/Output from previous day: 02/07 0701 - 02/08 0700 In: 2746.5 [P.O.:210; I.V.:1849.8; IV Piggyback:686.7] Out: 2050 [Urine:2000; Blood:50] Intake/Output this shift: Total I/O In: 60 [P.O.:60] Out: -   Recent Labs    08/16/23 0334  HGB 11.1*   Recent Labs    08/16/23 0334  WBC 14.6*  RBC 3.47*  HCT 32.9*  PLT 277   Recent Labs    08/16/23 0334  NA 130*  K 4.4  CL 98  CO2 22  BUN 15  CREATININE 0.66  GLUCOSE 171*  CALCIUM  8.2*   No results for input(s): LABPT, INR in the last 72 hours.  Sensation intact distally Intact pulses distally Dorsiflexion/Plantar flexion intact Incision: dressing C/D/I   Assessment/Plan: 1 Day Post-Op Procedure(s) (LRB): LEFT TOTAL HIP ARTHROPLASTY ANTERIOR APPROACH (Left) Up with therapy Discharge home with home health this afternoon if clears PT.      Teresa Marquez 08/16/2023, 10:48 AM

## 2023-08-16 NOTE — Progress Notes (Signed)
 Physical Therapy Treatment Patient Details Name: Teresa Marquez MRN: [id number] DOB: Nov 07, 1940 Today's Date: 08/16/2023   History of Present Illness Pt s/p L THR and with hx of    PT Comments  Pt continues motivated and progressing well with mobility including up to ambulate in hall, negotiated stairs, reviewed LB dressing, reviewed car transfer, and up to bathroom for toileting with hand hygiene standing at sink.  Pt eager for dc home this date.    If plan is discharge home, recommend the following: A little help with walking and/or transfers;A little help with bathing/dressing/bathroom;Assistance with cooking/housework;Assist for transportation;Help with stairs or ramp for entrance   Can travel by private vehicle        Equipment Recommendations  None recommended by PT    Recommendations for Other Services       Precautions / Restrictions Precautions Precautions: Fall Restrictions Weight Bearing Restrictions Per Provider Order: No LLE Weight Bearing Per Provider Order: Weight bearing as tolerated     Mobility  Bed Mobility Overal bed mobility: Needs Assistance Bed Mobility: Supine to Sit     Supine to sit: Min assist     General bed mobility comments: UP in chair and requests back to same`    Transfers Overall transfer level: Needs assistance Equipment used: Rolling walker (2 wheels) Transfers: Sit to/from Stand Sit to Stand: Contact guard assist, Supervision           General transfer comment: cues for LE management and use of UEs to self assist.    Ambulation/Gait Ambulation/Gait assistance: Contact guard assist, Supervision Gait Distance (Feet): 75 Feet Assistive device: Rolling walker (2 wheels) Gait Pattern/deviations: Step-to pattern, Step-through pattern, Decreased step length - right, Decreased step length - left, Shuffle, Trunk flexed Gait velocity: decr     General Gait Details: cues for posture, position from RW and initial  sequence   Stairs Stairs: Yes Stairs assistance: Min assist Stair Management: No rails, Step to pattern, Forwards, With walker Number of Stairs: 4 General stair comments: 2 steps twice with RW and cues for sequence   Wheelchair Mobility     Tilt Bed    Modified Rankin (Stroke Patients Only)       Balance Overall balance assessment: Needs assistance Sitting-balance support: No upper extremity supported, Feet supported Sitting balance-Leahy Scale: Good     Standing balance support: No upper extremity supported Standing balance-Leahy Scale: Fair                              Cognition Arousal: Alert Behavior During Therapy: WFL for tasks assessed/performed Overall Cognitive Status: Within Functional Limits for tasks assessed                                          Exercises Total Joint Exercises Ankle Circles/Pumps: AROM, Both, 15 reps, Supine    General Comments        Pertinent Vitals/Pain Pain Assessment Pain Assessment: 0-10 Pain Score: 5  Pain Location: L hip Pain Descriptors / Indicators: Aching, Sore, Burning Pain Intervention(s): Limited activity within patient's tolerance, Monitored during session, Premedicated before session, Ice applied    Home Living Family/patient expects to be discharged to:: Private residence Living Arrangements: Alone Available Help at Discharge: Family;Available 24 hours/day;Friend(s) Type of Home: House Home Access: Stairs to enter   [employer] of Steps:  2   Home Layout: One level Home Equipment: [employer] (2 wheels)      Prior Function            PT Goals (current goals can now be found in the care plan section) Acute Rehab PT Goals Patient Stated Goal: Regain IND PT Goal Formulation: With patient Time For Goal Achievement: 08/23/23 Potential to Achieve Goals: Good Progress towards PT goals: Progressing toward goals    Frequency    7X/week      PT Plan       Co-evaluation              AM-PAC PT 6 Clicks Mobility   Outcome Measure  Help needed turning from your back to your side while in a flat bed without using bedrails?: A Little Help needed moving from lying on your back to sitting on the side of a flat bed without using bedrails?: A Little Help needed moving to and from a bed to a chair (including a wheelchair)?: A Little Help needed standing up from a chair using your arms (e.g., wheelchair or bedside chair)?: A Little Help needed to walk in hospital room?: A Little Help needed climbing 3-5 steps with a railing? : A Little 6 Click Score: 18    End of Session Equipment Utilized During Treatment: Gait belt Activity Tolerance: Patient tolerated treatment well Patient left: in chair;with call bell/phone within reach;with chair alarm set Nurse Communication: Mobility status PT Visit Diagnosis: Unsteadiness on feet (R26.81);Difficulty in walking, not elsewhere classified (R26.2)     Time: 8679-8644 PT Time Calculation (min) (ACUTE ONLY): 35 min  Charges:    $Gait Training: 8-22 mins $Therapeutic Activity: 8-22 mins PT General Charges $$ ACUTE PT VISIT: 1 Visit                     Katrinka Acton PT Acute Rehabilitation Services Pager [phone] Office [phone]    Clavin Ruhlman 08/16/2023, 3:04 PM

## 2023-08-16 NOTE — TOC Transition Note (Signed)
 Transition of Care [facility]) - Discharge Note   Patient Details  Name: Teresa Marquez MRN: [id number] Date of Birth: 22-Jan-1941  Transition of Care [facility]) CM/SW Contact:  Bascom Service, RN Phone Number: 08/16/2023, 12:59 PM   Clinical Narrative:   d/c home. Prearranged HHPT w/[employer] rep Lynette aware. 3n1 ordered through [employer] rep Jermaine to deliver to rm prior d/c. Patient had no preference. Has a rw already even though ordered. Has own transport home.          Patient Goals and CMS Choice            Discharge Placement                       Discharge Plan and Services Additional resources added to the After Visit Summary for                                       Social Drivers of Health (SDOH) Interventions SDOH Screenings   Food Insecurity: No Food Insecurity (08/15/2023)  Housing: Low Risk  (08/15/2023)  Transportation Needs: No Transportation Needs (08/15/2023)  Utilities: Not At Risk (08/15/2023)  Depression (PHQ2-9): Low Risk  (05/27/2022)  Recent Concern: Depression (PHQ2-9) - Medium Risk (03/25/2022)  Social Connections: Moderately Integrated (08/15/2023)  Tobacco Use: Low Risk  (08/15/2023)     Readmission Risk Interventions     No data to display

## 2023-08-16 NOTE — Care Management Obs Status (Signed)
 MEDICARE OBSERVATION STATUS NOTIFICATION   Patient Details  Name: Teresa Marquez MRN: [id number] Date of Birth: 1941-01-16   Medicare Observation Status Notification Given:  Yes    MahabirThersia Flax, RN 08/16/2023, 12:58 PM

## 2023-08-16 NOTE — Plan of Care (Signed)
   Problem: Coping: Goal: Level of anxiety will decrease Outcome: Progressing   Problem: Pain Managment: Goal: General experience of comfort will improve and/or be controlled Outcome: Progressing   Problem: Safety: Goal: Ability to remain free from injury will improve Outcome: Progressing

## 2023-08-17 DIAGNOSIS — K760 Fatty (change of) liver, not elsewhere classified: Secondary | ICD-10-CM | POA: Diagnosis not present

## 2023-08-17 DIAGNOSIS — I25110 Atherosclerotic heart disease of native coronary artery with unstable angina pectoris: Secondary | ICD-10-CM | POA: Diagnosis not present

## 2023-08-17 DIAGNOSIS — Z951 Presence of aortocoronary bypass graft: Secondary | ICD-10-CM | POA: Diagnosis not present

## 2023-08-17 DIAGNOSIS — Z955 Presence of coronary angioplasty implant and graft: Secondary | ICD-10-CM | POA: Diagnosis not present

## 2023-08-17 DIAGNOSIS — Z9181 History of falling: Secondary | ICD-10-CM | POA: Diagnosis not present

## 2023-08-17 DIAGNOSIS — E782 Mixed hyperlipidemia: Secondary | ICD-10-CM | POA: Diagnosis not present

## 2023-08-17 DIAGNOSIS — Z7984 Long term (current) use of oral hypoglycemic drugs: Secondary | ICD-10-CM | POA: Diagnosis not present

## 2023-08-17 DIAGNOSIS — Z96643 Presence of artificial hip joint, bilateral: Secondary | ICD-10-CM | POA: Diagnosis not present

## 2023-08-17 DIAGNOSIS — G720 Drug-induced myopathy: Secondary | ICD-10-CM | POA: Diagnosis not present

## 2023-08-17 DIAGNOSIS — J310 Chronic rhinitis: Secondary | ICD-10-CM | POA: Diagnosis not present

## 2023-08-17 DIAGNOSIS — E119 Type 2 diabetes mellitus without complications: Secondary | ICD-10-CM | POA: Diagnosis not present

## 2023-08-17 DIAGNOSIS — M25512 Pain in left shoulder: Secondary | ICD-10-CM | POA: Diagnosis not present

## 2023-08-17 DIAGNOSIS — M5412 Radiculopathy, cervical region: Secondary | ICD-10-CM | POA: Diagnosis not present

## 2023-08-17 DIAGNOSIS — Z471 Aftercare following joint replacement surgery: Secondary | ICD-10-CM | POA: Diagnosis not present

## 2023-08-17 DIAGNOSIS — I10 Essential (primary) hypertension: Secondary | ICD-10-CM | POA: Diagnosis not present

## 2023-08-17 DIAGNOSIS — K219 Gastro-esophageal reflux disease without esophagitis: Secondary | ICD-10-CM | POA: Diagnosis not present

## 2023-08-17 DIAGNOSIS — M51369 Other intervertebral disc degeneration, lumbar region without mention of lumbar back pain or lower extremity pain: Secondary | ICD-10-CM | POA: Diagnosis not present

## 2023-08-17 DIAGNOSIS — Z86018 Personal history of other benign neoplasm: Secondary | ICD-10-CM | POA: Diagnosis not present

## 2023-08-17 DIAGNOSIS — Z85828 Personal history of other malignant neoplasm of skin: Secondary | ICD-10-CM | POA: Diagnosis not present

## 2023-08-17 DIAGNOSIS — G4733 Obstructive sleep apnea (adult) (pediatric): Secondary | ICD-10-CM | POA: Diagnosis not present

## 2023-08-17 DIAGNOSIS — Z79899 Other long term (current) drug therapy: Secondary | ICD-10-CM | POA: Diagnosis not present

## 2023-08-17 DIAGNOSIS — G8929 Other chronic pain: Secondary | ICD-10-CM | POA: Diagnosis not present

## 2023-08-17 DIAGNOSIS — Z7982 Long term (current) use of aspirin: Secondary | ICD-10-CM | POA: Diagnosis not present

## 2023-08-17 DIAGNOSIS — F419 Anxiety disorder, unspecified: Secondary | ICD-10-CM | POA: Diagnosis not present

## 2023-08-17 NOTE — Discharge Summary (Signed)
 Patient ID: Teresa Marquez MRN: [id number] DOB/AGE: August 05, 1940 83 y.o.  Admit date: 08/15/2023 Discharge date: 08/16/2023  Admission Diagnoses:  Principal Problem:   Unilateral primary osteoarthritis, left hip Active Problems:   Status post total replacement of left hip   Discharge Diagnoses:  Same  Past Medical History:  Diagnosis Date   Abdominal pain    Anxiety    Basal cell carcinoma    Coronary artery disease    DDD (degenerative disc disease), lumbar    Diabetes mellitus without complication (HCC)    DM (diabetes mellitus) (HCC)    Drug-induced myopathy    Elevated blood pressure reading in office without diagnosis of hypertension    GERD (gastroesophageal reflux disease)    Heart murmur    Hyperlipidemia    Menopause    NAFLD (nonalcoholic fatty liver disease)    OSA (obstructive sleep apnea)     Surgeries: Procedure(s): LEFT TOTAL HIP ARTHROPLASTY ANTERIOR APPROACH on 08/15/2023   Consultants:   Discharged Condition: Improved  Hospital Course: Teresa Marquez is an 83 y.o. female who was admitted 08/15/2023 for operative treatment ofUnilateral primary osteoarthritis, left hip. Patient has severe unremitting pain that affects sleep, daily activities, and work/hobbies. After pre-op clearance the patient was taken to the operating room on 08/15/2023 and underwent  Procedure(s): LEFT TOTAL HIP ARTHROPLASTY ANTERIOR APPROACH.    Patient was given perioperative antibiotics:  Anti-infectives (From admission, onward)    Start     Dose/Rate Route Frequency Ordered Stop   08/15/23 2000  vancomycin  (VANCOCIN ) IVPB 1000 mg/200 mL premix        1,000 mg 200 mL/hr over 60 Minutes Intravenous Every 12 hours 08/15/23 1424 08/15/23 2058   08/15/23 0800  levofloxacin  (LEVAQUIN ) IVPB 500 mg        500 mg 100 mL/hr over 60 Minutes Intravenous On call to O.R. 08/15/23 0752 08/15/23 1100   08/15/23 0800  vancomycin  (VANCOCIN ) IVPB 1000 mg/200 mL premix        1,000 mg 200  mL/hr over 60 Minutes Intravenous On call to O.R. 08/15/23 9247 08/15/23 1652        Patient was given sequential compression devices, early ambulation, and chemoprophylaxis to prevent DVT.  Patient benefited maximally from hospital stay and there were no complications.    Recent vital signs: No data found.   Recent laboratory studies:  Recent Labs    08/16/23 0334  WBC 14.6*  HGB 11.1*  HCT 32.9*  PLT 277  NA 130*  K 4.4  CL 98  CO2 22  BUN 15  CREATININE 0.66  GLUCOSE 171*  CALCIUM  8.2*     Discharge Medications:   Allergies as of 08/16/2023       Reactions   Bactrim [sulfamethoxazole-trimethoprim] Palpitations   Heart racing   Keflet [cephalexin]    Makes me bleed   Leqvio  [inclisiran Sodium ]    Reports burning in her feet and increase in A1c   Statins    worsening neuropathy and myalgias on atorvastatin  40mg  every other day, daily, and 80mg  daily, rosuvastatin 20mg  daily, and fluvastatin   Tetracyclines & Related    Yeast infection         Medication List     TAKE these medications    acetaminophen  500 MG tablet Commonly known as: TYLENOL  Take 500 mg by mouth every 6 (six) hours as needed for moderate pain.   Ascorbic Acid  500 MG Caps Take 500 mg by mouth daily.   aspirin  81 MG  chewable tablet Chew 1 tablet (81 mg total) by mouth 2 (two) times daily. What changed: when to take this   atorvastatin  40 MG tablet Commonly known as: LIPITOR  Take 1 tablet (40 mg total) by mouth every other day.   Co Q-10 50 MG Caps Take 50 mg by mouth daily.   ezetimibe  10 MG tablet Commonly known as: ZETIA  Take 1 tablet (10 mg total) by mouth daily.   fluticasone  50 MCG/ACT nasal spray Commonly known as: FLONASE  Place 2 sprays into both nostrils at bedtime.   folic acid  800 MCG tablet Commonly known as: FOLVITE  Take 800 mcg by mouth daily.   HAIR SKIN & NAILS ADVANCED PO Take 1 tablet by mouth daily.   HYDROcodone -acetaminophen  5-325 MG  tablet Commonly known as: NORCO/VICODIN Take 1-2 tablets by mouth every 6 (six) hours as needed for moderate pain (pain score 4-6).   metFORMIN  500 MG 24 hr tablet Commonly known as: GLUCOPHAGE -XR Take 1,000 mg by mouth daily.   methocarbamol  500 MG tablet Commonly known as: ROBAXIN  Take 1 tablet (500 mg total) by mouth every 6 (six) hours as needed for muscle spasms.   metoprolol  succinate 25 MG 24 hr tablet Commonly known as: Toprol  XL Take 0.5 tablets (12.5 mg total) by mouth daily.   NON FORMULARY Pt uses a cpap nightly   omeprazole 20 MG capsule Commonly known as: PRILOSEC Take 10 mg by mouth every evening.   polyethylene glycol 17 g packet Commonly known as: MIRALAX  / GLYCOLAX  Take 17 g by mouth daily.   REFRESH OP Place 1 drop into both eyes daily as needed (dry eyes).   SAMBUCOL BLACK ELDERBERRY PO Take 1 tablet by mouth daily.   TURMERIC PO Take 800 mg by mouth in the morning and at bedtime.   Vitamin D  50 MCG (2000 UT) Caps Take 2,000 Units by mouth daily.        Diagnostic Studies: DG Pelvis Portable Result Date: 08/15/2023 CLINICAL DATA:  Status post total left hip arthroplasty. EXAM: PORTABLE PELVIS 1-2 VIEWS COMPARISON:  AP bilateral hips 09/11/2009 FINDINGS: Interval total left hip arthroplasty. Redemonstration of prior total right hip arthroplasty. No perihardware lucency is seen to indicate hardware failure or loosening. Expected postsurgical changes of lateral left hip subcutaneous air. Lateral left hip surgical skin staples. Mild bilateral sacroiliac subchondral sclerosis. No acute fracture or dislocation. IMPRESSION: Interval total left hip arthroplasty without evidence of hardware failure. Electronically Signed   By: Tanda Lyons M.D.   On: 08/15/2023 13:02   DG HIP UNILAT WITH PELVIS 1V LEFT Result Date: 08/15/2023 CLINICAL DATA:  Intraoperative fluoroscopy for total left hip arthroplasty. EXAM: DG HIP (WITH OR WITHOUT PELVIS) 1V*L* COMPARISON:   Pelvis and left hip radiographs 12/09/2022 FINDINGS: Images were performed intraoperatively without the presence of a radiologist. Interval total left hip arthroplasty. Partial visualization of prior total right hip arthroplasty. No hardware complication is seen. Total fluoroscopy images: 3 Total fluoroscopy time: 23 seconds Total dose: Radiation Exposure Index (as provided by the fluoroscopic device): 2.84 mGy air Kerma Please see intraoperative findings for further detail. IMPRESSION: Intraoperative fluoroscopy for total left hip arthroplasty. Electronically Signed   By: Tanda Lyons M.D.   On: 08/15/2023 13:00   DG C-Arm 1-60 Min-No Report Result Date: 08/15/2023 Fluoroscopy was utilized by the requesting physician.  No radiographic interpretation.    Disposition: Discharge disposition: 01-Home or Self Care          Follow-up Information     Vernetta Lonni GRADE,  MD Follow up in 2 week(s).   Specialty: Orthopedic Surgery Contact information: [address] [place]  [place] [address] [phone]                  Signed: [name] 08/17/2023, 3:09 PM

## 2023-08-18 ENCOUNTER — Encounter (HOSPITAL_COMMUNITY): Payer: Self-pay | Admitting: Orthopaedic Surgery

## 2023-08-19 DIAGNOSIS — E119 Type 2 diabetes mellitus without complications: Secondary | ICD-10-CM | POA: Diagnosis not present

## 2023-08-19 DIAGNOSIS — K760 Fatty (change of) liver, not elsewhere classified: Secondary | ICD-10-CM | POA: Diagnosis not present

## 2023-08-19 DIAGNOSIS — G720 Drug-induced myopathy: Secondary | ICD-10-CM | POA: Diagnosis not present

## 2023-08-19 DIAGNOSIS — M51369 Other intervertebral disc degeneration, lumbar region without mention of lumbar back pain or lower extremity pain: Secondary | ICD-10-CM | POA: Diagnosis not present

## 2023-08-19 DIAGNOSIS — M5412 Radiculopathy, cervical region: Secondary | ICD-10-CM | POA: Diagnosis not present

## 2023-08-19 DIAGNOSIS — Z471 Aftercare following joint replacement surgery: Secondary | ICD-10-CM | POA: Diagnosis not present

## 2023-08-21 DIAGNOSIS — E119 Type 2 diabetes mellitus without complications: Secondary | ICD-10-CM | POA: Diagnosis not present

## 2023-08-21 DIAGNOSIS — Z471 Aftercare following joint replacement surgery: Secondary | ICD-10-CM | POA: Diagnosis not present

## 2023-08-21 DIAGNOSIS — M5412 Radiculopathy, cervical region: Secondary | ICD-10-CM | POA: Diagnosis not present

## 2023-08-21 DIAGNOSIS — G720 Drug-induced myopathy: Secondary | ICD-10-CM | POA: Diagnosis not present

## 2023-08-21 DIAGNOSIS — K760 Fatty (change of) liver, not elsewhere classified: Secondary | ICD-10-CM | POA: Diagnosis not present

## 2023-08-21 DIAGNOSIS — M51369 Other intervertebral disc degeneration, lumbar region without mention of lumbar back pain or lower extremity pain: Secondary | ICD-10-CM | POA: Diagnosis not present

## 2023-08-22 ENCOUNTER — Telehealth: Payer: Self-pay | Admitting: Radiology

## 2023-08-22 NOTE — Telephone Encounter (Signed)
Patient called triage this morning. Concerned about swelling. Doing very well, no pain, no fever, no tightness. Encouraged to continue elevation and ice. Is there anything else she can do?   Please return call [phone] cellphone [phone] home    [facility] if any Rx needs to be sent.

## 2023-08-23 DIAGNOSIS — E119 Type 2 diabetes mellitus without complications: Secondary | ICD-10-CM | POA: Diagnosis not present

## 2023-08-23 DIAGNOSIS — M5412 Radiculopathy, cervical region: Secondary | ICD-10-CM | POA: Diagnosis not present

## 2023-08-23 DIAGNOSIS — G720 Drug-induced myopathy: Secondary | ICD-10-CM | POA: Diagnosis not present

## 2023-08-23 DIAGNOSIS — Z471 Aftercare following joint replacement surgery: Secondary | ICD-10-CM | POA: Diagnosis not present

## 2023-08-23 DIAGNOSIS — M51369 Other intervertebral disc degeneration, lumbar region without mention of lumbar back pain or lower extremity pain: Secondary | ICD-10-CM | POA: Diagnosis not present

## 2023-08-23 DIAGNOSIS — K760 Fatty (change of) liver, not elsewhere classified: Secondary | ICD-10-CM | POA: Diagnosis not present

## 2023-08-25 DIAGNOSIS — M51369 Other intervertebral disc degeneration, lumbar region without mention of lumbar back pain or lower extremity pain: Secondary | ICD-10-CM | POA: Diagnosis not present

## 2023-08-25 DIAGNOSIS — G720 Drug-induced myopathy: Secondary | ICD-10-CM | POA: Diagnosis not present

## 2023-08-25 DIAGNOSIS — M5412 Radiculopathy, cervical region: Secondary | ICD-10-CM | POA: Diagnosis not present

## 2023-08-25 DIAGNOSIS — E119 Type 2 diabetes mellitus without complications: Secondary | ICD-10-CM | POA: Diagnosis not present

## 2023-08-25 DIAGNOSIS — Z471 Aftercare following joint replacement surgery: Secondary | ICD-10-CM | POA: Diagnosis not present

## 2023-08-25 DIAGNOSIS — K760 Fatty (change of) liver, not elsewhere classified: Secondary | ICD-10-CM | POA: Diagnosis not present

## 2023-08-27 DIAGNOSIS — M51369 Other intervertebral disc degeneration, lumbar region without mention of lumbar back pain or lower extremity pain: Secondary | ICD-10-CM | POA: Diagnosis not present

## 2023-08-27 DIAGNOSIS — M5412 Radiculopathy, cervical region: Secondary | ICD-10-CM | POA: Diagnosis not present

## 2023-08-27 DIAGNOSIS — E119 Type 2 diabetes mellitus without complications: Secondary | ICD-10-CM | POA: Diagnosis not present

## 2023-08-27 DIAGNOSIS — Z471 Aftercare following joint replacement surgery: Secondary | ICD-10-CM | POA: Diagnosis not present

## 2023-08-27 DIAGNOSIS — K760 Fatty (change of) liver, not elsewhere classified: Secondary | ICD-10-CM | POA: Diagnosis not present

## 2023-08-27 DIAGNOSIS — G720 Drug-induced myopathy: Secondary | ICD-10-CM | POA: Diagnosis not present

## 2023-08-28 ENCOUNTER — Ambulatory Visit (INDEPENDENT_AMBULATORY_CARE_PROVIDER_SITE_OTHER): Payer: Medicare Other | Admitting: Orthopaedic Surgery

## 2023-08-28 ENCOUNTER — Encounter: Payer: Self-pay | Admitting: Orthopaedic Surgery

## 2023-08-28 DIAGNOSIS — Z96642 Presence of left artificial hip joint: Secondary | ICD-10-CM

## 2023-08-28 NOTE — Progress Notes (Signed)
The patient is here today for first postoperative visit status post a left total hip arthroplasty to treat significant left hip arthritis.  She is 83 years old.  The patient reports that she is doing well.  She has been compliant with a baby aspirin twice daily.  She does not need a refill of pain medications.  She was on a once a day aspirin before surgery so she can go back to once a day.  She is still ambulating with a walker but she says she is ready to transition away from that and is taking her time to do so which we agree with that as well.  She does report bilateral foot and ankle swelling but has a harder time putting on compressive socks and with her neuropathy she would rather not wear these which think is reasonable.  We talked about elevation and bumping her feet.  Her left hip incision looks good.  The staples have been removed and Steri-Strips applied.  There is no significant seroma.  She will continue to increase her activity as comfort allows.  Will see her back in a month to see how she is doing overall but no x-rays are needed.

## 2023-09-10 ENCOUNTER — Ambulatory Visit
Admission: RE | Admit: 2023-09-10 | Discharge: 2023-09-10 | Disposition: A | Discharge status: Home / Self Care | Payer: Medicare Other | Source: Ambulatory Visit | Attending: Internal Medicine | Admitting: Internal Medicine

## 2023-09-10 ENCOUNTER — Other Ambulatory Visit: Payer: Self-pay | Admitting: Orthopaedic Surgery

## 2023-09-10 ENCOUNTER — Telehealth: Payer: Self-pay | Admitting: *Deleted

## 2023-09-10 DIAGNOSIS — Z1231 Encounter for screening mammogram for malignant neoplasm of breast: Secondary | ICD-10-CM | POA: Diagnosis not present

## 2023-09-10 MED ORDER — MUPIROCIN 2 % EX OINT: 1.0000 | TOPICAL_OINTMENT | Freq: Every day | CUTANEOUS | 0 refills | Status: DC

## 2023-09-10 NOTE — Telephone Encounter (Signed)
 Patient called and asked about a small area above her incision line just where her clothes have been rubbing. No warmth, drainage and per picture that I sent to you is about 1 inch long. Can you send her something in for this to her pharmacy. Thank you.

## 2023-09-23 DIAGNOSIS — E782 Mixed hyperlipidemia: Secondary | ICD-10-CM | POA: Diagnosis not present

## 2023-09-23 DIAGNOSIS — K760 Fatty (change of) liver, not elsewhere classified: Secondary | ICD-10-CM | POA: Diagnosis not present

## 2023-09-23 DIAGNOSIS — I2510 Atherosclerotic heart disease of native coronary artery without angina pectoris: Secondary | ICD-10-CM | POA: Diagnosis not present

## 2023-09-23 DIAGNOSIS — I700 Atherosclerosis of aorta: Secondary | ICD-10-CM | POA: Diagnosis not present

## 2023-09-23 DIAGNOSIS — E1122 Type 2 diabetes mellitus with diabetic chronic kidney disease: Secondary | ICD-10-CM | POA: Diagnosis not present

## 2023-09-23 DIAGNOSIS — N182 Chronic kidney disease, stage 2 (mild): Secondary | ICD-10-CM | POA: Diagnosis not present

## 2023-09-25 ENCOUNTER — Ambulatory Visit: Payer: Medicare Other | Admitting: Orthopaedic Surgery

## 2023-09-25 ENCOUNTER — Encounter: Payer: Self-pay | Admitting: Orthopaedic Surgery

## 2023-09-25 DIAGNOSIS — Z96642 Presence of left artificial hip joint: Secondary | ICD-10-CM

## 2023-09-25 NOTE — Progress Notes (Signed)
 The patient is now 6 weeks status post a left total hip replacement.  She is an active 83 year old female.  She is doing well overall.  On exam her left hip moves smoothly and fluidly.  Her leg lengths feel equal.  Her pain and sensation issues are reducing in terms around the incision.  Overall the hip moves smoothly and her gait appears to be normal.  From my standpoint she can get back in the gym and increase her activities as comfort allows.  Will see her back in 6 months with a standing AP pelvis and lateral of her left hip.  If there are issues before then she knows to reach out and let us know.

## 2023-09-29 DIAGNOSIS — N182 Chronic kidney disease, stage 2 (mild): Secondary | ICD-10-CM | POA: Diagnosis not present

## 2023-09-29 DIAGNOSIS — I700 Atherosclerosis of aorta: Secondary | ICD-10-CM | POA: Diagnosis not present

## 2023-09-29 DIAGNOSIS — I2510 Atherosclerotic heart disease of native coronary artery without angina pectoris: Secondary | ICD-10-CM | POA: Diagnosis not present

## 2023-09-29 DIAGNOSIS — G720 Drug-induced myopathy: Secondary | ICD-10-CM | POA: Diagnosis not present

## 2023-09-29 DIAGNOSIS — E1122 Type 2 diabetes mellitus with diabetic chronic kidney disease: Secondary | ICD-10-CM | POA: Diagnosis not present

## 2023-09-29 DIAGNOSIS — E782 Mixed hyperlipidemia: Secondary | ICD-10-CM | POA: Diagnosis not present

## 2023-09-29 DIAGNOSIS — K760 Fatty (change of) liver, not elsewhere classified: Secondary | ICD-10-CM | POA: Diagnosis not present

## 2023-10-13 ENCOUNTER — Other Ambulatory Visit: Payer: Self-pay | Admitting: Physician Assistant

## 2023-10-13 DIAGNOSIS — Z79899 Other long term (current) drug therapy: Secondary | ICD-10-CM

## 2023-10-13 DIAGNOSIS — E785 Hyperlipidemia, unspecified: Secondary | ICD-10-CM

## 2023-10-13 DIAGNOSIS — I2510 Atherosclerotic heart disease of native coronary artery without angina pectoris: Secondary | ICD-10-CM

## 2023-10-13 DIAGNOSIS — Z789 Other specified health status: Secondary | ICD-10-CM

## 2023-10-13 DIAGNOSIS — E782 Mixed hyperlipidemia: Secondary | ICD-10-CM

## 2023-10-13 DIAGNOSIS — Z951 Presence of aortocoronary bypass graft: Secondary | ICD-10-CM

## 2023-12-09 DIAGNOSIS — H40023 Open angle with borderline findings, high risk, bilateral: Secondary | ICD-10-CM | POA: Diagnosis not present

## 2024-01-05 ENCOUNTER — Other Ambulatory Visit: Payer: Self-pay | Admitting: Cardiovascular Disease

## 2024-02-10 DIAGNOSIS — E782 Mixed hyperlipidemia: Secondary | ICD-10-CM | POA: Diagnosis not present

## 2024-02-10 DIAGNOSIS — L648 Other androgenic alopecia: Secondary | ICD-10-CM | POA: Diagnosis not present

## 2024-02-10 DIAGNOSIS — X32XXXD Exposure to sunlight, subsequent encounter: Secondary | ICD-10-CM | POA: Diagnosis not present

## 2024-02-10 DIAGNOSIS — G720 Drug-induced myopathy: Secondary | ICD-10-CM | POA: Diagnosis not present

## 2024-02-10 DIAGNOSIS — Z1283 Encounter for screening for malignant neoplasm of skin: Secondary | ICD-10-CM | POA: Diagnosis not present

## 2024-02-10 DIAGNOSIS — L570 Actinic keratosis: Secondary | ICD-10-CM | POA: Diagnosis not present

## 2024-02-10 DIAGNOSIS — N182 Chronic kidney disease, stage 2 (mild): Secondary | ICD-10-CM | POA: Diagnosis not present

## 2024-02-10 DIAGNOSIS — D225 Melanocytic nevi of trunk: Secondary | ICD-10-CM | POA: Diagnosis not present

## 2024-02-10 DIAGNOSIS — E1122 Type 2 diabetes mellitus with diabetic chronic kidney disease: Secondary | ICD-10-CM | POA: Diagnosis not present

## 2024-02-10 DIAGNOSIS — B078 Other viral warts: Secondary | ICD-10-CM | POA: Diagnosis not present

## 2024-02-10 DIAGNOSIS — I2510 Atherosclerotic heart disease of native coronary artery without angina pectoris: Secondary | ICD-10-CM | POA: Diagnosis not present

## 2024-02-10 DIAGNOSIS — K760 Fatty (change of) liver, not elsewhere classified: Secondary | ICD-10-CM | POA: Diagnosis not present

## 2024-02-10 DIAGNOSIS — I700 Atherosclerosis of aorta: Secondary | ICD-10-CM | POA: Diagnosis not present

## 2024-02-17 DIAGNOSIS — I700 Atherosclerosis of aorta: Secondary | ICD-10-CM | POA: Diagnosis not present

## 2024-02-17 DIAGNOSIS — I2510 Atherosclerotic heart disease of native coronary artery without angina pectoris: Secondary | ICD-10-CM | POA: Diagnosis not present

## 2024-02-17 DIAGNOSIS — N182 Chronic kidney disease, stage 2 (mild): Secondary | ICD-10-CM | POA: Diagnosis not present

## 2024-02-17 DIAGNOSIS — E1122 Type 2 diabetes mellitus with diabetic chronic kidney disease: Secondary | ICD-10-CM | POA: Diagnosis not present

## 2024-02-17 DIAGNOSIS — G720 Drug-induced myopathy: Secondary | ICD-10-CM | POA: Diagnosis not present

## 2024-02-17 DIAGNOSIS — E782 Mixed hyperlipidemia: Secondary | ICD-10-CM | POA: Diagnosis not present

## 2024-02-17 DIAGNOSIS — K760 Fatty (change of) liver, not elsewhere classified: Secondary | ICD-10-CM | POA: Diagnosis not present

## 2024-02-20 DIAGNOSIS — R300 Dysuria: Secondary | ICD-10-CM | POA: Diagnosis not present

## 2024-03-29 ENCOUNTER — Encounter: Payer: Self-pay | Admitting: Orthopaedic Surgery

## 2024-03-29 ENCOUNTER — Ambulatory Visit (INDEPENDENT_AMBULATORY_CARE_PROVIDER_SITE_OTHER): Admitting: Orthopaedic Surgery

## 2024-03-29 ENCOUNTER — Other Ambulatory Visit (INDEPENDENT_AMBULATORY_CARE_PROVIDER_SITE_OTHER): Payer: Self-pay

## 2024-03-29 DIAGNOSIS — Z96642 Presence of left artificial hip joint: Secondary | ICD-10-CM | POA: Diagnosis not present

## 2024-03-29 NOTE — Progress Notes (Signed)
 The patient is an 83 year old female who is here today in follow-up as a relates to her left total hip replacement.  This was performed through an anterior approach 8 months ago and was secondary to significant left hip osteoarthritis.  She does have a remote history of a right hip replacement done elsewhere.  She does report a little bit of stiffness with her left hip symptoms with putting her shoes and socks on and some numbness but overall she is doing well and walking without assistive device.  She is pleased overall.  Both hips move smoothly and fluidly with no blocks or rotation.  Her leg lengths appear equal.  Standing AP pelvis and lateral left hip shows both her hip replacements are well-seated.  At this point she is doing so well that follow-up can be as needed.  If she does develop any issues related to her hip she knows to let us  know or any orthopedic issues otherwise.  She does report that she will like to see her hand specialist at some point for a ganglion cyst on the volar aspect of her left wrist is where she points to.

## 2024-03-30 ENCOUNTER — Ambulatory Visit (INDEPENDENT_AMBULATORY_CARE_PROVIDER_SITE_OTHER): Admitting: Orthopedic Surgery

## 2024-03-30 DIAGNOSIS — M67432 Ganglion, left wrist: Secondary | ICD-10-CM | POA: Diagnosis not present

## 2024-03-30 NOTE — Progress Notes (Signed)
 Teresa Marquez - 83 y.o. female MRN [id number]  Date of birth: [birth year]  Office Visit Note: Visit Date: 03/30/2024 PCP: Verdia Lombard, MD Referred by: Verdia Lombard, MD  Subjective: No chief complaint on file.  HPI: Teresa Marquez is a pleasant 83 y.o. female who presents today for evaluation of a left wrist volar ganglion cyst has been present now for multiple years.  She has been seen previously by an alternate hand surgeon who did recommend ongoing observation given the location of the cyst.  She states that the symptoms are relatively minimal and manageable at this time.  She does have some soreness throughout the wrist region and basilar aspect of the left thumb which is related mostly to overuse.  Denies any significant numbness or tingling.  She is here today for specific hand surgical evaluation.  Pertinent ROS were reviewed with the patient and found to be negative unless otherwise specified above in HPI.   Visit Reason: Left wrist volar ganglion cyst Duration of symptoms: years Hand dominance: right Occupation: retired Diabetic: Yes, 6.8 Smoking: No Heart/Lung History: Cardiovascular and Mediastinum Unstable angina (HCC) Coronary artery disease   Respiratory Chronic rhinitis Obstructive sleep apnea   Blood Thinners: aspirin   Prior Testing/EMG: none Injections (Date): none Treatments: none Prior Surgery: left pointer finger cyst removal   Assessment & Plan: Visit Diagnoses:  1. 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  We discussed underlying etiology and pathophysiology of this condition.  We discussed treatment allergies range from conservative to surgical.  From a conservative standpoint, we discussed ongoing activity modification, bracing, nonsteroidal anti-inflammatory medication both topical and oral.  From a surgical  standpoint, we discussed surgical excision as well as all risks and benefits associated, we also discussed the possibility of recurrence.  In addition, her cyst is located at the volar aspect of the wrist meaning that there is likely involvement of the radial artery or possible median nerve in this region which is consistent with her prior opinion.  Risks and benefits of the procedure were discussed, risks including but not limited to infection, bleeding, scarring, stiffness, nerve injury, tendon injury, vascular injury, recurrence of symptoms and need for subsequent operation.  We also discussed the appropriate postoperative protocol and timeframe for return to activities and function.  Patient expressed understanding.    For the time being, understanding her options, she would like to continue conservative approach.  She will be fitted today for a Comfort Cool brace to be utilized as needed particular for the basilar thumb pain going forward.  She is welcome to return to me at any point for further discussion regarding options.  I spent 45 minutes in the care of this patient today including review of previous documentation, imaging obtained, face-to-face time discussing all options regarding treatment and documenting the encounter.   Follow-up: No follow-ups on file.   Meds & Orders: No orders of the defined types were placed in this encounter.  No orders of the defined types were placed in this encounter.    Procedures: No procedures performed      Clinical History: No specialty comments available.  She reports that she has never smoked. She has never used smokeless tobacco.  Recent Labs    08/07/23 1032  HGBA1C 6.8*    Objective:   Vital Signs: There were no vitals taken for this visit.  Physical  Exam  Gen: Well-appearing, in no acute distress; non-toxic CV: Regular Rate. Well-perfused. Warm.  Resp: Breathing unlabored on room air; no wheezing. Psych: Fluid speech in  conversation; appropriate affect; normal thought process  Ortho Exam Left wrist: - Palpable mass over the volar wrist crease, midline, measures approximately 3 cm x 2 cm, soft, compressible, mobile, mildly tender - Allen's testing demonstrates appropriate refill of both radial and ulnar circulations - Digital range of motion is well-preserved throughout the hand, composite fist without restriction, sensation is intact in median/radial/ulnar distributions, AIN/PIN/interosseous intact - Wrist range of motion is well-preserved flexion/extension 65/55, full pronation supination without restriction  Imaging: No results found.  Past Medical/Family/Surgical/Social History: Medications & Allergies reviewed per EMR, new medications updated. 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   S/P CABG x 1 02/14/2022   Coronary artery disease 02/13/2022   Unstable angina (HCC) 02/11/2022   Elevated blood-pressure reading without diagnosis of hypertension 01/30/2022   Mixed hyperlipidemia 01/30/2022   Cervical radiculopathy 11/12/2016   Gastroesophageal reflux disease without esophagitis 10/01/2016   Obstructive sleep apnea 10/01/2016   Oral cyst 10/01/2016   Chronic rhinitis 08/07/2016   History of pituitary adenoma 08/07/2016   S/P rotator cuff repair 08/29/2015   Incomplete tear of right rotator cuff 08/03/2015   Chronic right shoulder pain 03/16/2015   Knee pain 09/09/2013   History of total hip arthroplasty 03/05/2013   Low back pain 03/05/2013   Past Medical History:  Diagnosis Date   Abdominal pain    Anxiety    Basal cell carcinoma    Coronary artery disease    DDD (degenerative disc disease), lumbar    Diabetes mellitus without complication (HCC)    DM (diabetes mellitus) (HCC)    Drug-induced myopathy    Elevated blood pressure reading in office without diagnosis of hypertension    GERD (gastroesophageal reflux  disease)    Heart murmur    Hyperlipidemia    Menopause    NAFLD (nonalcoholic fatty liver disease)    OSA (obstructive sleep apnea)    Family History  Problem Relation Age of Onset   Cancer Mother    Cancer Father    Past Surgical History:  Procedure Laterality Date   BREAST EXCISIONAL BIOPSY Left    benign   CARPAL TUNNEL RELEASE     CORONARY ARTERY BYPASS GRAFT N/A 02/13/2022   Procedure: CORONARY ARTERY BYPASS GRAFTING (CABG) TIMES ONE USING LIMA.;  Surgeon: Kerrin Elspeth BROCKS, MD;  Location: [facility] OR;  Service: Open Heart Surgery;  Laterality: N/A;   FOOT SURGERY     LEFT HEART CATH AND CORONARY ANGIOGRAPHY N/A 02/11/2022   Procedure: LEFT HEART CATH AND CORONARY ANGIOGRAPHY;  Surgeon: Wonda Sharper, MD;  Location: [facility] INVASIVE CV LAB;  Service: Cardiovascular;  Laterality: N/A;   NASAL SINUS SURGERY     pituitary adenoma resection     SHOULDER SURGERY     TEE WITHOUT CARDIOVERSION N/A 02/13/2022   Procedure: TRANSESOPHAGEAL ECHOCARDIOGRAM (TEE);  Surgeon: Kerrin Elspeth BROCKS, MD;  Location: [facility] OR;  Service: Open Heart Surgery;  Laterality: N/A;   TOTAL HIP ARTHROPLASTY Right    TOTAL HIP ARTHROPLASTY Left 08/15/2023   Procedure: LEFT TOTAL HIP ARTHROPLASTY ANTERIOR APPROACH;  Surgeon: Vernetta Lonni GRADE, MD;  Location: [facility] ORS;  Service: Orthopedics;  Laterality: Left;   TUBAL LIGATION     Social History   Occupational History   Not on file  Tobacco  Use   Smoking status: Never   Smokeless tobacco: Never  Vaping Use   Vaping status: Never Used  Substance and Sexual Activity   Alcohol  use: Yes    Alcohol /week: 1.0 standard drink of alcohol     Types: 1 Glasses of wine per week    Comment: rare   Drug use: Never   Sexual activity: Not on file    Hend Mccarrell Afton Alderton, M.D.  [facility], Hand Surgery

## 2024-05-06 DIAGNOSIS — H524 Presbyopia: Secondary | ICD-10-CM | POA: Diagnosis not present

## 2024-05-06 DIAGNOSIS — E119 Type 2 diabetes mellitus without complications: Secondary | ICD-10-CM | POA: Diagnosis not present

## 2024-05-06 DIAGNOSIS — H04123 Dry eye syndrome of bilateral lacrimal glands: Secondary | ICD-10-CM | POA: Diagnosis not present

## 2024-05-06 DIAGNOSIS — H0288B Meibomian gland dysfunction left eye, upper and lower eyelids: Secondary | ICD-10-CM | POA: Diagnosis not present

## 2024-05-06 DIAGNOSIS — H5203 Hypermetropia, bilateral: Secondary | ICD-10-CM | POA: Diagnosis not present

## 2024-05-06 DIAGNOSIS — H2513 Age-related nuclear cataract, bilateral: Secondary | ICD-10-CM | POA: Diagnosis not present

## 2024-05-06 DIAGNOSIS — H40023 Open angle with borderline findings, high risk, bilateral: Secondary | ICD-10-CM | POA: Diagnosis not present

## 2024-05-10 ENCOUNTER — Encounter: Payer: Self-pay | Admitting: Radiology

## 2024-05-18 DIAGNOSIS — R5383 Other fatigue: Secondary | ICD-10-CM | POA: Diagnosis not present

## 2024-05-18 DIAGNOSIS — Z23 Encounter for immunization: Secondary | ICD-10-CM | POA: Diagnosis not present

## 2024-05-18 DIAGNOSIS — K760 Fatty (change of) liver, not elsewhere classified: Secondary | ICD-10-CM | POA: Diagnosis not present

## 2024-05-18 DIAGNOSIS — I2510 Atherosclerotic heart disease of native coronary artery without angina pectoris: Secondary | ICD-10-CM | POA: Diagnosis not present

## 2024-05-18 DIAGNOSIS — I700 Atherosclerosis of aorta: Secondary | ICD-10-CM | POA: Diagnosis not present

## 2024-05-18 DIAGNOSIS — E1122 Type 2 diabetes mellitus with diabetic chronic kidney disease: Secondary | ICD-10-CM | POA: Diagnosis not present

## 2024-05-18 DIAGNOSIS — G720 Drug-induced myopathy: Secondary | ICD-10-CM | POA: Diagnosis not present

## 2024-05-18 DIAGNOSIS — E782 Mixed hyperlipidemia: Secondary | ICD-10-CM | POA: Diagnosis not present

## 2024-05-18 DIAGNOSIS — N182 Chronic kidney disease, stage 2 (mild): Secondary | ICD-10-CM | POA: Diagnosis not present

## 2024-05-25 DIAGNOSIS — E782 Mixed hyperlipidemia: Secondary | ICD-10-CM | POA: Diagnosis not present

## 2024-05-25 DIAGNOSIS — E1122 Type 2 diabetes mellitus with diabetic chronic kidney disease: Secondary | ICD-10-CM | POA: Diagnosis not present

## 2024-05-25 DIAGNOSIS — K760 Fatty (change of) liver, not elsewhere classified: Secondary | ICD-10-CM | POA: Diagnosis not present

## 2024-05-25 DIAGNOSIS — N182 Chronic kidney disease, stage 2 (mild): Secondary | ICD-10-CM | POA: Diagnosis not present

## 2024-05-25 DIAGNOSIS — G720 Drug-induced myopathy: Secondary | ICD-10-CM | POA: Diagnosis not present

## 2024-05-25 DIAGNOSIS — I2510 Atherosclerotic heart disease of native coronary artery without angina pectoris: Secondary | ICD-10-CM | POA: Diagnosis not present

## 2024-05-25 DIAGNOSIS — I10 Essential (primary) hypertension: Secondary | ICD-10-CM | POA: Diagnosis not present

## 2024-05-25 DIAGNOSIS — Z0000 Encounter for general adult medical examination without abnormal findings: Secondary | ICD-10-CM | POA: Diagnosis not present

## 2024-05-25 DIAGNOSIS — I700 Atherosclerosis of aorta: Secondary | ICD-10-CM | POA: Diagnosis not present

## 2024-06-09 NOTE — Progress Notes (Unsigned)
 No chief complaint on file.  History of Present Illness: 83 yo female with history of CAD s/p CABG, DM, HTN, HLD and GERD who is here today for follow up. She has been followed by Dr. Hobart. She was seen in August 2023 with chest pain. Coronary CTA with critical LAD stenosis. Cardiac cath August 2023 with severe ostial LAD stenosis, not favorable for PCI. She underwent 1 V CABG in August 2023 with LIMA to the LAD. Echo August 2023 with LVEF=60-65%, no valve disease. She has not tolerated statins well in the past. She tried Leqvio  in July 2024 but this made her feet burn. She does not wish to take again. She has been taking Lipitor  40 mg every other day and Zetia  10 mg daily.   She is here today for follow up. The patient denies any chest pain, dyspnea, palpitations, lower extremity edema, orthopnea, PND, dizziness, near syncope or syncope.   Primary Care Physician: Verdia Lombard, MD   Past Medical History:  Diagnosis Date   Abdominal pain    Anxiety    Basal cell carcinoma    Coronary artery disease    DDD (degenerative disc disease), lumbar    Diabetes mellitus without complication (HCC)    DM (diabetes mellitus) (HCC)    Drug-induced myopathy    Elevated blood pressure reading in office without diagnosis of hypertension    GERD (gastroesophageal reflux disease)    Heart murmur    Hyperlipidemia    Menopause    NAFLD (nonalcoholic fatty liver disease)    OSA (obstructive sleep apnea)     Past Surgical History:  Procedure Laterality Date   BREAST EXCISIONAL BIOPSY Left    benign   CARPAL TUNNEL RELEASE     CORONARY ARTERY BYPASS GRAFT N/A 02/13/2022   Procedure: CORONARY ARTERY BYPASS GRAFTING (CABG) TIMES ONE USING LIMA.;  Surgeon: Kerrin Elspeth BROCKS, MD;  Location: [facility] OR;  Service: Open Heart Surgery;  Laterality: N/A;   FOOT SURGERY     LEFT HEART CATH AND CORONARY ANGIOGRAPHY N/A 02/11/2022   Procedure: LEFT HEART CATH AND CORONARY ANGIOGRAPHY;  Surgeon:  Wonda Sharper, MD;  Location: [facility] INVASIVE CV LAB;  Service: Cardiovascular;  Laterality: N/A;   NASAL SINUS SURGERY     pituitary adenoma resection     SHOULDER SURGERY     TEE WITHOUT CARDIOVERSION N/A 02/13/2022   Procedure: TRANSESOPHAGEAL ECHOCARDIOGRAM (TEE);  Surgeon: Kerrin Elspeth BROCKS, MD;  Location: [facility] OR;  Service: Open Heart Surgery;  Laterality: N/A;   TOTAL HIP ARTHROPLASTY Right    TOTAL HIP ARTHROPLASTY Left 08/15/2023   Procedure: LEFT TOTAL HIP ARTHROPLASTY ANTERIOR APPROACH;  Surgeon: Vernetta Lonni GRADE, MD;  Location: [facility] ORS;  Service: Orthopedics;  Laterality: Left;   TUBAL LIGATION      Current Outpatient Medications  Medication Sig Dispense Refill   mupirocin  ointment (BACTROBAN ) 2 % Apply 1 Application topically daily. Apply to wound daily after shower. 22 g 0   acetaminophen  (TYLENOL ) 500 MG tablet Take 500 mg by mouth every 6 (six) hours as needed for moderate pain.     Ascorbic Acid  500 MG CAPS Take 500 mg by mouth daily.     aspirin  81 MG chewable tablet Chew 1 tablet (81 mg total) by mouth 2 (two) times daily. 30 tablet 0   atorvastatin  (LIPITOR ) 40 MG tablet TAKE 1 TABLET EVERY OTHER DAY 45 tablet 3   Cholecalciferol  (VITAMIN D ) 50 MCG (2000 UT) CAPS Take 2,000 Units by mouth daily.  Coenzyme Q10 (CO Q-10) 50 MG CAPS Take 50 mg by mouth daily.     ezetimibe  (ZETIA ) 10 MG tablet Take 1 tablet (10 mg total) by mouth daily. 90 tablet 2   fluticasone  (FLONASE ) 50 MCG/ACT nasal spray Place 2 sprays into both nostrils at bedtime.     folic acid  (FOLVITE ) 800 MCG tablet Take 800 mcg by mouth daily.     HYDROcodone -acetaminophen  (NORCO/VICODIN) 5-325 MG tablet Take 1-2 tablets by mouth every 6 (six) hours as needed for moderate pain (pain score 4-6). 30 tablet 0   metFORMIN  (GLUCOPHAGE -XR) 500 MG 24 hr tablet Take 1,000 mg by mouth daily.     methocarbamol  (ROBAXIN ) 500 MG tablet Take 1 tablet (500 mg total) by mouth every 6 (six) hours as needed for muscle  spasms. 30 tablet 1   metoprolol  succinate (TOPROL -XL) 25 MG 24 hr tablet TAKE 1/2 TABLET EVERY DAY 45 tablet 2   Multiple Vitamins-Minerals (HAIR SKIN & NAILS ADVANCED PO) Take 1 tablet by mouth daily.     NON FORMULARY Pt uses a cpap nightly     omeprazole (PRILOSEC) 20 MG capsule Take 10 mg by mouth every evening.     polyethylene glycol (MIRALAX  / GLYCOLAX ) 17 g packet Take 17 g by mouth daily.     Polyvinyl Alcohol -Povidone (REFRESH OP) Place 1 drop into both eyes daily as needed (dry eyes).     SAMBUCOL BLACK ELDERBERRY PO Take 1 tablet by mouth daily.     TURMERIC PO Take 800 mg by mouth in the morning and at bedtime.     No current facility-administered medications for this visit.    Allergies  Allergen Reactions   Bactrim [Sulfamethoxazole-Trimethoprim] Palpitations    Heart racing   Keflet [Cephalexin]     Makes me bleed   Leqvio  [Inclisiran Sodium ]     Reports burning in her feet and increase in A1c   Statins     worsening neuropathy and myalgias on atorvastatin  40mg  every other day, daily, and 80mg  daily, rosuvastatin 20mg  daily, and fluvastatin   Tetracyclines & Related     Yeast infection     Social History   Socioeconomic History   Marital status: Widowed    Spouse name: Not on file   Number of children: Not on file   Years of education: Not on file   Highest education level: Not on file  Occupational History   Not on file  Tobacco Use   Smoking status: Never   Smokeless tobacco: Never  Vaping Use   Vaping status: Never Used  Substance and Sexual Activity   Alcohol  use: Yes    Alcohol /week: 1.0 standard drink of alcohol     Types: 1 Glasses of wine per week    Comment: rare   Drug use: Never   Sexual activity: Not on file  Other Topics Concern   Not on file  Social History Narrative   Not on file   Social Drivers of Health   Financial Resource Strain: Not on file  Food Insecurity: No Food Insecurity (08/15/2023)   Hunger Vital Sign    Worried  About Running Out of Food in the Last Year: Never true    Ran Out of Food in the Last Year: Never true  Transportation Needs: No Transportation Needs (08/15/2023)   PRAPARE - Administrator, Civil Service (Medical): No    Lack of Transportation (Non-Medical): No  Physical Activity: Not on file  Stress: Not on file  Social  Connections: Moderately Integrated (08/15/2023)   Social Connection and Isolation Panel    Frequency of Communication with Friends and Family: More than three times a week    Frequency of Social Gatherings with Friends and Family: More than three times a week    Attends Religious Services: More than 4 times per year    Active Member of [employer] or Organizations: Yes    Attends Banker Meetings: More than 4 times per year    Marital Status: Widowed  Intimate Partner Violence: Not At Risk (08/15/2023)   Humiliation, Afraid, Rape, and Kick questionnaire    Fear of Current or Ex-Partner: No    Emotionally Abused: No    Physically Abused: No    Sexually Abused: No    Family History  Problem Relation Age of Onset   Cancer Mother    Cancer Father     Review of Systems:  As stated in the HPI and otherwise negative.   There were no vitals taken for this visit.  Physical Examination: General: Well developed, well nourished, NAD  HEENT: OP clear, mucus membranes moist  SKIN: warm, dry. No rashes. Neuro: No focal deficits  Musculoskeletal: Muscle strength 5/5 all ext  Psychiatric: Mood and affect normal  Neck: No JVD, no carotid bruits, no thyromegaly, no lymphadenopathy.  Lungs:Clear bilaterally, no wheezes, rhonci, crackles Cardiovascular: Regular rate and rhythm. No murmurs, gallops or rubs. Abdomen:Soft. Bowel sounds present. Non-tender.  Extremities: No lower extremity edema. Pulses are 2 + in the bilateral DP/PT.  EKG:  EKG is *** ordered today. The ekg ordered today demonstrates   Recent Labs: 08/16/2023: BUN 15; Creatinine, Ser 0.66;  Hemoglobin 11.1; Platelets 277; Potassium 4.4; Sodium 130   Lipid Panel    Component Value Date/Time   CHOL 118 09/04/2022 0731   TRIG 109 09/04/2022 0731   HDL 46 09/04/2022 0731   CHOLHDL 2.6 09/04/2022 0731   CHOLHDL 4.8 02/12/2022 0808   VLDL 32 02/12/2022 0808   LDLCALC 52 09/04/2022 0731     Wt Readings from Last 3 Encounters:  08/15/23 169 lb 12.1 oz (77 kg)  08/07/23 171 lb (77.6 kg)  06/09/23 179 lb (81.2 kg)    Assessment and Plan:   1. CAD s/p CABG without angina: No chest pain.  -Continue ASA, Lipitor , Zetia  and Toprol .    2. Hyperlipidemia: She has not tolerated statins well in the past. She was seen in the lipid clinic in June 2024 and was started on Leqvio  in July 2024 but did not tolerate this. She does not wish to try Repatha or Praluent. She is back on Lipitor  40 mg every other day and Zetia  10 mg daily and tolerating well. LDL ***.  -Continue Lipitor  and Zetia    3. HTN: BP is well controlled.  -Continue Toprol   4. Systolic murmur: *** No valve disease on echo in August 2023.   Labs/ tests ordered today include:  No orders of the defined types were placed in this encounter.  Disposition:   F/U with me in 12 months   Signed, Lonni Cash, MD, [facility] 06/09/2024 3:42 PM    [facility] [address] [place], [place], [place]  [address] Phone: [phone]; Fax: [phone]

## 2024-06-10 ENCOUNTER — Ambulatory Visit: Attending: Cardiovascular Disease | Admitting: Cardiovascular Disease

## 2024-06-10 ENCOUNTER — Encounter: Payer: Self-pay | Admitting: Cardiovascular Disease

## 2024-06-10 DIAGNOSIS — E782 Mixed hyperlipidemia: Secondary | ICD-10-CM | POA: Diagnosis not present

## 2024-06-10 DIAGNOSIS — I2510 Atherosclerotic heart disease of native coronary artery without angina pectoris: Secondary | ICD-10-CM | POA: Insufficient documentation

## 2024-06-10 DIAGNOSIS — I10 Essential (primary) hypertension: Secondary | ICD-10-CM | POA: Diagnosis not present

## 2024-06-10 NOTE — Patient Instructions (Signed)

## 2024-07-12 ENCOUNTER — Other Ambulatory Visit: Payer: Self-pay | Admitting: Cardiovascular Disease

## 2024-07-12 DIAGNOSIS — Z79899 Other long term (current) drug therapy: Secondary | ICD-10-CM

## 2024-07-12 DIAGNOSIS — Z789 Other specified health status: Secondary | ICD-10-CM

## 2024-07-12 DIAGNOSIS — I2510 Atherosclerotic heart disease of native coronary artery without angina pectoris: Secondary | ICD-10-CM

## 2024-07-12 DIAGNOSIS — E782 Mixed hyperlipidemia: Secondary | ICD-10-CM

## 2024-07-12 DIAGNOSIS — Z951 Presence of aortocoronary bypass graft: Secondary | ICD-10-CM

## 2024-07-12 DIAGNOSIS — E785 Hyperlipidemia, unspecified: Secondary | ICD-10-CM

## 2024-07-13 ENCOUNTER — Telehealth (HOSPITAL_COMMUNITY): Payer: Self-pay | Admitting: Pharmacist

## 2024-07-13 NOTE — Addendum Note (Signed)
 Addended by: DAYNE SHERRY RAMAN on: 07/13/2024 12:59 PM   Modules accepted: Orders

## 2024-07-13 NOTE — Telephone Encounter (Signed)
 Leqvio  treatment plan discontinued due to allergic reaction to treatment  Bonny Vanleeuwen, PharmD, MPH, BCPS, CPP Clinical Pharmacist

## 2024-08-05 ENCOUNTER — Other Ambulatory Visit: Payer: Self-pay | Admitting: Internal Medicine

## 2024-08-05 DIAGNOSIS — Z1231 Encounter for screening mammogram for malignant neoplasm of breast: Secondary | ICD-10-CM

## 2024-09-13 ENCOUNTER — Ambulatory Visit
